# Patient Record
Sex: Female | Born: 1935 | Race: White | Hispanic: No | Marital: Married | Smoking: Former smoker
Health system: Southern US, Community
[De-identification: ages and names within clinical notes are randomized; demographics above are authoritative.]

## PROBLEM LIST (undated history)

## (undated) DIAGNOSIS — M545 Low back pain, unspecified: Secondary | ICD-10-CM

## (undated) DIAGNOSIS — N184 Chronic kidney disease, stage 4 (severe): Secondary | ICD-10-CM

## (undated) DIAGNOSIS — I2510 Atherosclerotic heart disease of native coronary artery without angina pectoris: Secondary | ICD-10-CM

## (undated) DIAGNOSIS — K859 Acute pancreatitis without necrosis or infection, unspecified: Secondary | ICD-10-CM

## (undated) DIAGNOSIS — I480 Paroxysmal atrial fibrillation: Secondary | ICD-10-CM

## (undated) DIAGNOSIS — M797 Fibromyalgia: Secondary | ICD-10-CM

## (undated) DIAGNOSIS — I639 Cerebral infarction, unspecified: Secondary | ICD-10-CM

## (undated) DIAGNOSIS — M1990 Unspecified osteoarthritis, unspecified site: Secondary | ICD-10-CM

## (undated) DIAGNOSIS — E785 Hyperlipidemia, unspecified: Secondary | ICD-10-CM

## (undated) DIAGNOSIS — G8929 Other chronic pain: Secondary | ICD-10-CM

## (undated) DIAGNOSIS — I10 Essential (primary) hypertension: Secondary | ICD-10-CM

## (undated) DIAGNOSIS — Z950 Presence of cardiac pacemaker: Secondary | ICD-10-CM

## (undated) DIAGNOSIS — E119 Type 2 diabetes mellitus without complications: Secondary | ICD-10-CM

## (undated) DIAGNOSIS — G4730 Sleep apnea, unspecified: Secondary | ICD-10-CM

## (undated) HISTORY — PX: FIXATION KYPHOPLASTY LUMBAR SPINE: SHX1642

## (undated) HISTORY — PX: CARDIAC VALVE REPLACEMENT: SHX585

## (undated) HISTORY — PX: KNEE ARTHROSCOPY: SHX127

## (undated) HISTORY — PX: AORTIC VALVE REPLACEMENT (AVR)/CORONARY ARTERY BYPASS GRAFTING (CABG): SHX5725

## (undated) HISTORY — PX: BACK SURGERY: SHX140

## (undated) HISTORY — PX: BREAST SURGERY: SHX581

---

## 1962-07-27 HISTORY — PX: PARTIAL THYMECTOMY: SHX2177

## 2004-07-27 HISTORY — PX: ADRENALECTOMY: SHX876

## 2008-07-27 DIAGNOSIS — I639 Cerebral infarction, unspecified: Secondary | ICD-10-CM

## 2008-07-27 HISTORY — DX: Cerebral infarction, unspecified: I63.9

## 2012-07-27 HISTORY — PX: CARDIAC CATHETERIZATION: SHX172

## 2012-07-27 HISTORY — PX: CORONARY ARTERY BYPASS GRAFT: SHX141

## 2013-04-05 HISTORY — PX: INSERT / REPLACE / REMOVE PACEMAKER: SUR710

## 2015-05-31 ENCOUNTER — Other Ambulatory Visit (HOSPITAL_COMMUNITY): Payer: Self-pay | Admitting: Neurology

## 2015-05-31 DIAGNOSIS — R413 Other amnesia: Secondary | ICD-10-CM

## 2015-05-31 DIAGNOSIS — M549 Dorsalgia, unspecified: Secondary | ICD-10-CM

## 2015-05-31 DIAGNOSIS — G8929 Other chronic pain: Secondary | ICD-10-CM

## 2015-06-13 ENCOUNTER — Ambulatory Visit (HOSPITAL_COMMUNITY)
Admission: RE | Admit: 2015-06-13 | Discharge: 2015-06-13 | Disposition: A | Discharge status: Home / Self Care | Payer: Medicare Other | Source: Ambulatory Visit | Attending: Neurology | Admitting: Neurology

## 2015-06-13 ENCOUNTER — Encounter (HOSPITAL_COMMUNITY): Payer: Self-pay | Admitting: *Deleted

## 2015-06-13 DIAGNOSIS — G319 Degenerative disease of nervous system, unspecified: Secondary | ICD-10-CM | POA: Diagnosis not present

## 2015-06-13 DIAGNOSIS — M549 Dorsalgia, unspecified: Secondary | ICD-10-CM | POA: Insufficient documentation

## 2015-06-13 DIAGNOSIS — R413 Other amnesia: Secondary | ICD-10-CM | POA: Diagnosis not present

## 2015-06-13 DIAGNOSIS — G8929 Other chronic pain: Secondary | ICD-10-CM

## 2015-06-13 DIAGNOSIS — M5136 Other intervertebral disc degeneration, lumbar region: Secondary | ICD-10-CM | POA: Insufficient documentation

## 2015-06-13 NOTE — Progress Notes (Signed)
Monitor pt in MRI via pulse ox after PPM placed in AV mode per [employer] rep.

## 2017-02-14 ENCOUNTER — Emergency Department (HOSPITAL_COMMUNITY): Payer: Medicare Other

## 2017-02-14 ENCOUNTER — Encounter (HOSPITAL_COMMUNITY): Payer: Self-pay | Admitting: Emergency Medicine

## 2017-02-14 ENCOUNTER — Inpatient Hospital Stay (HOSPITAL_COMMUNITY)
Admission: EM | Admit: 2017-02-14 | Discharge: 2017-02-23 | DRG: 418 | Disposition: A | Discharge status: Home / Self Care | Payer: Medicare Other | Attending: Internal Medicine | Admitting: Internal Medicine

## 2017-02-14 DIAGNOSIS — K8050 Calculus of bile duct without cholangitis or cholecystitis without obstruction: Secondary | ICD-10-CM | POA: Diagnosis not present

## 2017-02-14 DIAGNOSIS — M1991 Primary osteoarthritis, unspecified site: Secondary | ICD-10-CM | POA: Diagnosis present

## 2017-02-14 DIAGNOSIS — I2510 Atherosclerotic heart disease of native coronary artery without angina pectoris: Secondary | ICD-10-CM | POA: Diagnosis present

## 2017-02-14 DIAGNOSIS — I480 Paroxysmal atrial fibrillation: Secondary | ICD-10-CM | POA: Diagnosis not present

## 2017-02-14 DIAGNOSIS — N179 Acute kidney failure, unspecified: Secondary | ICD-10-CM | POA: Diagnosis present

## 2017-02-14 DIAGNOSIS — E669 Obesity, unspecified: Secondary | ICD-10-CM | POA: Diagnosis present

## 2017-02-14 DIAGNOSIS — R109 Unspecified abdominal pain: Secondary | ICD-10-CM

## 2017-02-14 DIAGNOSIS — Z79899 Other long term (current) drug therapy: Secondary | ICD-10-CM | POA: Diagnosis not present

## 2017-02-14 DIAGNOSIS — K859 Acute pancreatitis without necrosis or infection, unspecified: Secondary | ICD-10-CM

## 2017-02-14 DIAGNOSIS — Z01810 Encounter for preprocedural cardiovascular examination: Secondary | ICD-10-CM | POA: Diagnosis not present

## 2017-02-14 DIAGNOSIS — K5900 Constipation, unspecified: Secondary | ICD-10-CM | POA: Diagnosis present

## 2017-02-14 DIAGNOSIS — N184 Chronic kidney disease, stage 4 (severe): Secondary | ICD-10-CM | POA: Diagnosis present

## 2017-02-14 DIAGNOSIS — I10 Essential (primary) hypertension: Secondary | ICD-10-CM | POA: Diagnosis not present

## 2017-02-14 DIAGNOSIS — I129 Hypertensive chronic kidney disease with stage 1 through stage 4 chronic kidney disease, or unspecified chronic kidney disease: Secondary | ICD-10-CM | POA: Diagnosis present

## 2017-02-14 DIAGNOSIS — Z8673 Personal history of transient ischemic attack (TIA), and cerebral infarction without residual deficits: Secondary | ICD-10-CM

## 2017-02-14 DIAGNOSIS — Z885 Allergy status to narcotic agent status: Secondary | ICD-10-CM | POA: Diagnosis not present

## 2017-02-14 DIAGNOSIS — E119 Type 2 diabetes mellitus without complications: Secondary | ICD-10-CM | POA: Diagnosis not present

## 2017-02-14 DIAGNOSIS — Z794 Long term (current) use of insulin: Secondary | ICD-10-CM

## 2017-02-14 DIAGNOSIS — E890 Postprocedural hypothyroidism: Secondary | ICD-10-CM | POA: Diagnosis present

## 2017-02-14 DIAGNOSIS — Z951 Presence of aortocoronary bypass graft: Secondary | ICD-10-CM | POA: Diagnosis not present

## 2017-02-14 DIAGNOSIS — D649 Anemia, unspecified: Secondary | ICD-10-CM | POA: Diagnosis present

## 2017-02-14 DIAGNOSIS — I482 Chronic atrial fibrillation: Secondary | ICD-10-CM | POA: Diagnosis present

## 2017-02-14 DIAGNOSIS — M797 Fibromyalgia: Secondary | ICD-10-CM | POA: Diagnosis present

## 2017-02-14 DIAGNOSIS — Z7983 Long term (current) use of bisphosphonates: Secondary | ICD-10-CM | POA: Diagnosis not present

## 2017-02-14 DIAGNOSIS — Z950 Presence of cardiac pacemaker: Secondary | ICD-10-CM | POA: Diagnosis not present

## 2017-02-14 DIAGNOSIS — Z880 Allergy status to penicillin: Secondary | ICD-10-CM

## 2017-02-14 DIAGNOSIS — Z952 Presence of prosthetic heart valve: Secondary | ICD-10-CM

## 2017-02-14 DIAGNOSIS — Z7901 Long term (current) use of anticoagulants: Secondary | ICD-10-CM

## 2017-02-14 DIAGNOSIS — I4581 Long QT syndrome: Secondary | ICD-10-CM | POA: Diagnosis present

## 2017-02-14 DIAGNOSIS — R269 Unspecified abnormalities of gait and mobility: Secondary | ICD-10-CM

## 2017-02-14 DIAGNOSIS — Z881 Allergy status to other antibiotic agents status: Secondary | ICD-10-CM | POA: Diagnosis not present

## 2017-02-14 DIAGNOSIS — K851 Biliary acute pancreatitis without necrosis or infection: Secondary | ICD-10-CM | POA: Diagnosis not present

## 2017-02-14 DIAGNOSIS — E1122 Type 2 diabetes mellitus with diabetic chronic kidney disease: Secondary | ICD-10-CM | POA: Diagnosis present

## 2017-02-14 DIAGNOSIS — Z6835 Body mass index (BMI) 35.0-35.9, adult: Secondary | ICD-10-CM

## 2017-02-14 DIAGNOSIS — Z419 Encounter for procedure for purposes other than remedying health state, unspecified: Secondary | ICD-10-CM

## 2017-02-14 DIAGNOSIS — G8929 Other chronic pain: Secondary | ICD-10-CM | POA: Diagnosis present

## 2017-02-14 DIAGNOSIS — E785 Hyperlipidemia, unspecified: Secondary | ICD-10-CM | POA: Diagnosis present

## 2017-02-14 DIAGNOSIS — Z888 Allergy status to other drugs, medicaments and biological substances status: Secondary | ICD-10-CM

## 2017-02-14 DIAGNOSIS — I340 Nonrheumatic mitral (valve) insufficiency: Secondary | ICD-10-CM | POA: Diagnosis not present

## 2017-02-14 DIAGNOSIS — I4891 Unspecified atrial fibrillation: Secondary | ICD-10-CM | POA: Diagnosis present

## 2017-02-14 HISTORY — DX: Fibromyalgia: M79.7

## 2017-02-14 HISTORY — DX: Paroxysmal atrial fibrillation: I48.0

## 2017-02-14 HISTORY — DX: Low back pain: M54.5

## 2017-02-14 HISTORY — DX: Acute pancreatitis without necrosis or infection, unspecified: K85.90

## 2017-02-14 HISTORY — DX: Low back pain, unspecified: M54.50

## 2017-02-14 HISTORY — DX: Unspecified osteoarthritis, unspecified site: M19.90

## 2017-02-14 HISTORY — DX: Other chronic pain: G89.29

## 2017-02-14 HISTORY — DX: Chronic kidney disease, stage 4 (severe): N18.4

## 2017-02-14 HISTORY — DX: Type 2 diabetes mellitus without complications: E11.9

## 2017-02-14 HISTORY — DX: Atherosclerotic heart disease of native coronary artery without angina pectoris: I25.10

## 2017-02-14 HISTORY — DX: Sleep apnea, unspecified: G47.30

## 2017-02-14 HISTORY — DX: Cerebral infarction, unspecified: I63.9

## 2017-02-14 HISTORY — DX: Hyperlipidemia, unspecified: E78.5

## 2017-02-14 HISTORY — DX: Presence of cardiac pacemaker: Z95.0

## 2017-02-14 HISTORY — DX: Essential (primary) hypertension: I10

## 2017-02-14 LAB — CBC
HCT: 39.4 % (ref 36.0–46.0)
Hemoglobin: 13 g/dL (ref 12.0–15.0)
MCH: 31.3 pg (ref 26.0–34.0)
MCHC: 33 g/dL (ref 30.0–36.0)
MCV: 94.7 fL (ref 78.0–100.0)
PLATELETS: 261 10*3/uL (ref 150–400)
RBC: 4.16 MIL/uL (ref 3.87–5.11)
RDW: 12.9 % (ref 11.5–15.5)
WBC: 18.5 10*3/uL — ABNORMAL HIGH (ref 4.0–10.5)

## 2017-02-14 LAB — COMPREHENSIVE METABOLIC PANEL
ALBUMIN: 3.7 g/dL (ref 3.5–5.0)
ALK PHOS: 154 U/L — AB (ref 38–126)
ALT: 453 U/L — AB (ref 14–54)
AST: 508 U/L — ABNORMAL HIGH (ref 15–41)
Anion gap: 13 (ref 5–15)
BUN: 35 mg/dL — ABNORMAL HIGH (ref 6–20)
CALCIUM: 9.5 mg/dL (ref 8.9–10.3)
CO2: 20 mmol/L — AB (ref 22–32)
CREATININE: 2.26 mg/dL — AB (ref 0.44–1.00)
Chloride: 104 mmol/L (ref 101–111)
GFR calc non Af Amer: 19 mL/min — ABNORMAL LOW (ref >60)
GFR, EST AFRICAN AMERICAN: 22 mL/min — AB (ref >60)
GLUCOSE: 271 mg/dL — AB (ref 65–99)
Potassium: 4.4 mmol/L (ref 3.5–5.1)
SODIUM: 137 mmol/L (ref 135–145)
Total Bilirubin: 1.7 mg/dL — ABNORMAL HIGH (ref 0.3–1.2)
Total Protein: 6.7 g/dL (ref 6.5–8.1)

## 2017-02-14 LAB — I-STAT TROPONIN, ED: TROPONIN I, POC: 0.02 ng/mL (ref 0.00–0.08)

## 2017-02-14 LAB — CREATININE, SERUM
CREATININE: 2.21 mg/dL — AB (ref 0.44–1.00)
GFR calc Af Amer: 23 mL/min — ABNORMAL LOW (ref >60)
GFR, EST NON AFRICAN AMERICAN: 20 mL/min — AB (ref >60)

## 2017-02-14 LAB — LIPASE, BLOOD: Lipase: 798 U/L — ABNORMAL HIGH (ref 11–51)

## 2017-02-14 MED ORDER — METRONIDAZOLE IN NACL 5-0.79 MG/ML-% IV SOLN: 500.0000 mg | Freq: Once | INTRAVENOUS | Status: DC

## 2017-02-14 MED ORDER — HEPARIN SODIUM (PORCINE) 5000 UNIT/ML IJ SOLN
5000.0000 [IU] | Freq: Three times a day (TID) | INTRAMUSCULAR | Status: DC
  Administered 2017-02-14 – 2017-02-15 (×3): 5000 [IU] via SUBCUTANEOUS
  Filled 2017-02-14 (×3): qty 1

## 2017-02-14 MED ORDER — CIPROFLOXACIN IN D5W 400 MG/200ML IV SOLN: 400.0000 mg | Freq: Once | INTRAVENOUS | Status: DC

## 2017-02-14 MED ORDER — MORPHINE SULFATE (PF) 4 MG/ML IV SOLN
4.0000 mg | Freq: Once | INTRAVENOUS | Status: AC
  Administered 2017-02-14: 4 mg via INTRAVENOUS
  Filled 2017-02-14: qty 1

## 2017-02-14 MED ORDER — LACTATED RINGERS IV BOLUS (SEPSIS)
500.0000 mL | Freq: Once | INTRAVENOUS | Status: AC
  Administered 2017-02-14: 500 mL via INTRAVENOUS

## 2017-02-14 MED ORDER — LACTATED RINGERS IV BOLUS (SEPSIS)
1000.0000 mL | Freq: Once | INTRAVENOUS | Status: AC
  Administered 2017-02-14: 1000 mL via INTRAVENOUS

## 2017-02-14 MED ORDER — SODIUM CHLORIDE 0.9 % IV SOLN
INTRAVENOUS | Status: AC
  Administered 2017-02-14 – 2017-02-15 (×3): via INTRAVENOUS

## 2017-02-14 MED ORDER — MORPHINE SULFATE (PF) 4 MG/ML IV SOLN
4.0000 mg | INTRAVENOUS | Status: DC | PRN
Start: 2017-02-14 — End: 2017-02-14

## 2017-02-14 MED ORDER — HYDROMORPHONE HCL 1 MG/ML IJ SOLN
0.5000 mg | INTRAMUSCULAR | Status: DC | PRN
  Administered 2017-02-14 – 2017-02-15 (×3): 0.5 mg via INTRAVENOUS
  Filled 2017-02-14 (×3): qty 1

## 2017-02-14 MED ORDER — ONDANSETRON HCL 4 MG/2ML IJ SOLN
4.0000 mg | Freq: Once | INTRAMUSCULAR | Status: AC
  Administered 2017-02-14: 4 mg via INTRAVENOUS
  Filled 2017-02-14: qty 2

## 2017-02-14 MED ORDER — ONDANSETRON 4 MG PO TBDP
4.0000 mg | ORAL_TABLET | Freq: Four times a day (QID) | ORAL | Status: DC | PRN
  Administered 2017-02-15 – 2017-02-17 (×3): 4 mg via ORAL
  Filled 2017-02-14 (×3): qty 1

## 2017-02-14 NOTE — ED Triage Notes (Signed)
Pt has been vomiting since last pm. Pts dtr reports it has been dark green. Pt unable to keep anything down. Pt has not taken her insulin today. CBG 227 per dtr. Pt having periodic severe upper abd pain. Denies CP

## 2017-02-14 NOTE — H&P (Signed)
Date: 02/14/2017               Patient Name:  Daisy Pearson MRN: [id number]  DOB: April 06, 1934 Age / Sex: 83 y.o., female   PCP: Karleen Hampshire., MD         Medical Service: Internal Medicine Teaching Service         Attending Physician: Dr. Aldine Contes, MD    First Contact: Dr. Tarri Abernethy  Pager: [phone]  Second Contact: Dr. Marlowe Sax  Pager: [phone]       After Hours (After 5p/  First Contact Pager: [phone]  weekends / holidays): Second Contact Pager: [phone]   Chief Complaint: Abdominal pain, nausea, and vomiting   History of Present Illness: Daisy Pearson is a 83 y.o. female with history paroxysmal atrial fibrillation on coumadin, insulin-dependent Z2YQ complicated by CKD4, CAD s/p CABG and pacemaker, HTN, HLD, and OA who presents with acute onset of epigastric abdominal pain associated with nausea and vomiting. Patient has been in her usual state of health until yesterday when she started to experience abdominal pain (8/10) last night and immediately started vomiting. She has been vomiting every 20 minutes since them. Emesis is NBNB. Denies changes in bowel movements. Last BM yesterday, no hematochezia or melena. Denies similar episodes in the past. Denied fever, chills, chest pain , SOB, and urinary symptoms.                                                                                                                                                                                              ED course: Patient was HDS on arrival. Lab work remarkable for lipase 798, transaminitis AST 508 and AL 453, and leukocytosis 18.5. RUQ U/S with no acute findings. She received 1.5L LR bolus and IV morphine 4mg  x2 with improvement in pain.  Meds:  Current Meds  Medication Sig  . acetaminophen (TYLENOL) 325 MG tablet Take 650 mg by mouth every 6  (six) hours as needed for headache (pain).  Marland Kitchen alendronate (FOSAMAX) 70 MG tablet Take 70 mg by mouth every Saturday. Take with a full glass of water on an empty stomach.  Marland Kitchen amiodarone (PACERONE) 200 MG tablet Take 200 mg by mouth daily.  Marland Kitchen atorvastatin (LIPITOR) 40 MG tablet Take 40 mg by mouth daily.  . calcitRIOL (ROCALTROL) 0.5 MCG capsule Take 0.5 mcg by mouth daily.  . cetirizine (ZYRTEC) 10 MG tablet Take 10 mg by mouth daily as needed for allergies.  . Cholecalciferol (VITAMIN D) 2000 units tablet Take 2,000 Units by mouth daily.  . enalapril (VASOTEC) 2.5 MG tablet Take 2.5 mg by mouth daily.  Marland Kitchen EPINEPHrine (EPIPEN 2-PAK) 0.3 mg/0.3 mL IJ SOAJ injection Inject 0.3 mg into the muscle once as needed (severe allergic reaction).  . Insulin Detemir (LEVEMIR FLEXTOUCH) 100 UNIT/ML Pen Inject 18 Units into the skin 2 (two) times daily.  . Insulin Glulisine (APIDRA SOLOSTAR) 100 UNIT/ML Solostar Pen Inject 12 Units into the skin See admin instructions. Inject 12 units subcutaneously three times daily before meals - plus adjustment if CBG >200 - 1 unit for every 20  . metaxalone (SKELAXIN) 800 MG tablet Take 800 mg by mouth 3 (three) times daily as needed for muscle spasms.  . Methylfol-Methylcob-Acetylcyst (CEREFOLIN NAC) 6-2-600 MG TABS Take 1 tablet by mouth daily.  . metoprolol tartrate (LOPRESSOR) 25 MG tablet Take 25 mg by mouth 2 (two) times daily.  . mirtazapine (REMERON) 15 MG tablet Take 15 mg by mouth at bedtime.  . nitroGLYCERIN (NITROSTAT) 0.4 MG SL tablet Place 0.4 mg under the tongue every 5 (five) minutes as needed for chest pain.  . Omega-3 Fatty Acids (OMEGA 3 PO) Take 1 capsule by mouth daily.  Marland Kitchen oxyCODONE-acetaminophen (PERCOCET/ROXICET) 5-325 MG tablet Take 1 tablet by mouth 2 (two) times daily as needed for severe pain.  Marland Kitchen QUEtiapine (SEROQUEL) 25 MG tablet Take 25 mg by mouth at bedtime.  Marland Kitchen warfarin (JANTOVEN) 4 MG tablet Take 2-4 mg by mouth See admin instructions. Take 1/2  tablet (2 mg) by mouth on Saturday at bedtime, take 1 tablet (4 mg) on all other nights     Allergies: Allergies as of 02/14/2017 - Review Complete 02/14/2017  Allergen Reaction Noted  . Esomeprazole sodium Swelling 01/18/2015  . Penicillins Anaphylaxis and Rash 02/14/2017  . Clindamycin/lincomycin Hives 02/14/2017  . Hydrocodone Other (See Comments) 02/14/2017  . Nsaids Hives and Other (See Comments) 02/14/2017  . Valsartan Swelling 02/14/2017  . Prilosec [omeprazole] Swelling and Rash 02/14/2017   Past Medical History:  Diagnosis Date  . Arthritis   . Diabetes mellitus without complication ([place])   . Fibromyalgia   . Hypertension   . Renal disorder    Past Surgical History:  Procedure Laterality Date  . ADRENALECTOMY  2006  . AORTIC VALVE REPLACEMENT (AVR)/CORONARY ARTERY BYPASS GRAFTING (CABG)    . PACEMAKER PLACEMENT  04/05/2013   [facility] Serial# [id number] Lemmie Evens [id number], SERIAL [id number]  . THYROIDECTOMY  1964     Family History:  History reviewed. No pertinent family history.  Social History:  Social History  Substance Use Topics  . Smoking status: Never Smoker  . Smokeless tobacco: Never Used  . Alcohol use No    Review of Systems: A complete ROS was negative except as per HPI.   Physical Exam: Blood pressure (!) 109/41, pulse 60,  temperature 99.1 F (37.3 C), temperature source Oral, resp. rate 19, height 5\' 4"  (1.626 m), weight 204 lb (92.5 kg), SpO2 96 %.  General: very pleasant, obese female, lying in bed in no acute distress  HENT: NCAT, neck supple and FROM, MMM, OP clear without exudates or erythema, poor dentition  Eyes: anicteric sclera, PERRL Cardiac: regular rate and rhythm, nl S1/S2, no murmurs, rubs or gallops  Pulm: CTAB, no wheezes or crackles, no increased work of breathing  Abd: soft, obese abdomen, tenderness over epigastrium and R upper and lower quadrants, normoactive bowel sounds, negative Murphy sign  Neuro: A&Ox3, able to  move all four extremities, no focal deficits  Ext: warm and well perfused, mild peripheral edema bilaterally, 2+ DP pulses bilaterally, nl skin turgor  Derm: no skin findings  Psych: forgetful at times, appropriate affect   Labs:  CBC WBC 18.5 H/H 13.0/39.4 Plt 261 CMP 137  4.4  104  20  35  2.26  271 Lipase 798 I-stat troponin negtative   EKG: personally reviewed my interpretation is atrial-paced rhythm at 60bpm, prolonged QT 576ms, no axis deviation, no acute ischemic changes   CXR: personally reviewed my interpretation is patent airway, no enlargement of cardiac silhouette, sternotomy from CABG and pacemaker noted, no consolidations, opacities, effusion or edema noted   RUQ U/S: Gallbladder:No gallstones or gallbladder wall thickening. No pericholecystic fluid. The sonographer reports no sonographic Murphy's sign. Common bile duct: Diameter: 4 mm Liver: No focal lesion identified. Within normal limits in parenchymal echogenicity.   Assessment & Plan by Problem:  Shequita J. Zenz is a 83 y.o. female with history paroxysmal atrial fibrillation on coumadin, insulin-dependent M0QQ complicated by CKD4, CAD s/p CABG and pacemaker, HTN, HLD, and OA who presents with acute onset of epigastric abdominal pain associated with nausea and vomiting.  # Acute pancreatitis: Patient presented with acute onset epigastric/RUQ abdominal pain associated with nausea and vomiting, lipase 798 and leukocytosis 18.5 consistent with acute pancreatitis. RUQ [place] with no evidence of gallstones, obstruction, or cholecystitis. No history of alcohol use and no recent changes in medications.  - NPO  - NS @ 150cc/hr  - Pain control: IV Dilaudid 0.5mg  q4h PRN  - CMP and lipid panel in AM  - GI consulted and will see in AM  - IV zofran 4mg  PRN for nausea   # Atrial fibrillation, paroxysmal: On amiodarone and warfarin at home  - Holding home meds in the setting of NPO  - SQ heparin   # T2DM,  insulin-dependent: Takes levemir 30U qAM and qHS + sliding scale with meals. Last A1c on chart 7.7 03/2015.  - Will hold home meds since patient is NPO - A1c pending  - CBG monitoring   # CKD 4: Cr 2.26. Baseline unknown.  - CMP in AM  - Avoid nephrotoxic drugs   # HTN: On enalapril 2.5mg  daily at home  - Holding home meds in the setting of NPO   # HLD  - Holding home atorvastatin in the setting of NPO   # CAD s/p CABG: On metoprolol tartrate 25mg  BID at home.  - Holding home meds in the setting of NPO   # Chronic pain: On percocet 5 at home  - Holding home atorvastatin in the setting of NPO    IVF: NS @150cc /hr Diet: NPO DVT ppx: SQ heparin  Code status: Full code, confirmed on admission   Dispo: Admit patient to Inpatient with expected length of stay greater than 2 midnights.  Signed: Welford Roche, MD  Internal Medicine PGY-1  P [phone] 02/14/2017, 10:36 PM

## 2017-02-14 NOTE — ED Provider Notes (Addendum)
[facility] DEPT Provider Note   CSN: [id number] Arrival date & time: 02/14/17  1619     History   Chief Complaint Chief Complaint  Patient presents with  . Emesis    HPI Daisy Pearson is a 83 y.o. female.  HPI Pt comes in with cc of abd pain. Pt has hx of DM, HTN. Pt reports that she started having epigastric pain radiating to the sides last night and she has had 20+ episodes of emesis since then. Pt's emesis is dark in color. Pt has no radiation of the pain. Pt has some dizziness, lightheadedness. She had a BM today.   Past Medical History:  Diagnosis Date  . Arthritis   . Diabetes mellitus without complication ([place])   . Fibromyalgia   . Hypertension   . Renal disorder     There are no active problems to display for this patient.   Past Surgical History:  Procedure Laterality Date  . ADRENALECTOMY  2006  . AORTIC VALVE REPLACEMENT (AVR)/CORONARY ARTERY BYPASS GRAFTING (CABG)    . PACEMAKER PLACEMENT  04/05/2013   [facility] Serial# [id number] Lemmie Evens [id number], SERIAL [id number]  . THYROIDECTOMY  1964    OB History    No data available       Home Medications    Prior to Admission medications   Medication Sig Start Date End Date Taking? Authorizing Provider  acetaminophen (TYLENOL) 325 MG tablet Take 650 mg by mouth every 6 (six) hours as needed for headache (pain).   Yes [provider]  alendronate (FOSAMAX) 70 MG tablet Take 70 mg by mouth every Saturday. Take with a full glass of water on an empty stomach.   Yes [provider]  amiodarone (PACERONE) 200 MG tablet Take 200 mg by mouth daily.   Yes [provider]  atorvastatin (LIPITOR) 40 MG tablet Take 40 mg by mouth daily.   Yes [provider]  calcitRIOL (ROCALTROL) 0.5 MCG capsule Take 0.5 mcg by mouth daily.   Yes [provider]  cetirizine (ZYRTEC) 10 MG tablet Take 10 mg by mouth daily as needed for allergies.   Yes [provider]    Cholecalciferol (VITAMIN D) 2000 units tablet Take 2,000 Units by mouth daily.   Yes [provider]  enalapril (VASOTEC) 2.5 MG tablet Take 2.5 mg by mouth daily.   Yes [provider]  EPINEPHrine (EPIPEN 2-PAK) 0.3 mg/0.3 mL IJ SOAJ injection Inject 0.3 mg into the muscle once as needed (severe allergic reaction).   Yes [provider]  Insulin Detemir (LEVEMIR FLEXTOUCH) 100 UNIT/ML Pen Inject 18 Units into the skin 2 (two) times daily.   Yes [provider]  Insulin Glulisine (APIDRA SOLOSTAR) 100 UNIT/ML Solostar Pen Inject 12 Units into the skin See admin instructions. Inject 12 units subcutaneously three times daily before meals - plus adjustment if CBG >200 - 1 unit for every 20   Yes [provider]  metaxalone (SKELAXIN) 800 MG tablet Take 800 mg by mouth 3 (three) times daily as needed for muscle spasms.   Yes [provider]  Methylfol-Methylcob-Acetylcyst (CEREFOLIN NAC) 6-2-600 MG TABS Take 1 tablet by mouth daily.   Yes [provider]  metoprolol tartrate (LOPRESSOR) 25 MG tablet Take 25 mg by mouth 2 (two) times daily.   Yes [provider]  mirtazapine (REMERON) 15 MG tablet Take 15 mg by mouth at bedtime.   Yes [provider]  nitroGLYCERIN (NITROSTAT) 0.4 MG SL tablet Place 0.4 mg  under the tongue every 5 (five) minutes as needed for chest pain.   Yes [provider]  Omega-3 Fatty Acids (OMEGA 3 PO) Take 1 capsule by mouth daily.   Yes [provider]  oxyCODONE-acetaminophen (PERCOCET/ROXICET) 5-325 MG tablet Take 1 tablet by mouth 2 (two) times daily as needed for severe pain.   Yes [provider]  QUEtiapine (SEROQUEL) 25 MG tablet Take 25 mg by mouth at bedtime.   Yes [provider]  warfarin (JANTOVEN) 4 MG tablet Take 2-4 mg by mouth See admin instructions. Take 1/2 tablet (2 mg) by mouth on Saturday at bedtime, take 1 tablet (4 mg) on all other nights    Yes [provider]    Family History History reviewed. No pertinent family history.  Social History Social History  Substance Use Topics  . Smoking status: Never Smoker  . Smokeless tobacco: Never Used  . Alcohol use No     Allergies   Esomeprazole sodium; Penicillins; Clindamycin/lincomycin; Hydrocodone; Nsaids; Valsartan; and Prilosec [omeprazole]   Review of Systems Review of Systems  Constitutional: Positive for activity change.  Gastrointestinal: Positive for abdominal pain, nausea and vomiting.  All other systems reviewed and are negative.    Physical Exam Updated Vital Signs BP (!) 123/45   Pulse 60   Temp 98.4 F (36.9 C) (Oral)   Resp 20   Ht 5\' 4"  (1.626 m)   Wt 92.5 kg (204 lb)   SpO2 96%   BMI 35.02 kg/m   Physical Exam  Constitutional: She is oriented to person, place, and time. She appears well-developed and well-nourished.  HENT:  Head: Normocephalic and atraumatic.  Eyes: Pupils are equal, round, and reactive to light. EOM are normal.  Neck: Neck supple.  Cardiovascular: Normal rate, regular rhythm and normal heart sounds.   No murmur heard. Pulmonary/Chest: Effort normal. No respiratory distress.  Abdominal: Soft. She exhibits no distension. There is tenderness. There is guarding. There is no rebound.  Epigastric and RUQ tenderness with guarding  Neurological: She is alert and oriented to person, place, and time.  Skin: Skin is warm and dry.  Nursing note and vitals reviewed.    ED Treatments / Results  Labs (all labs ordered are listed, but only abnormal results are displayed) Labs Reviewed  LIPASE, BLOOD - Abnormal; Notable for the following:       Result Value   Lipase 798 (*)    All other components within normal limits  COMPREHENSIVE METABOLIC PANEL - Abnormal; Notable for the following:    CO2 20 (*)    Glucose, Bld 271 (*)    BUN 35 (*)    Creatinine, Ser 2.26 (*)    AST 508 (*)    ALT 453 (*)    Alkaline  Phosphatase 154 (*)    Total Bilirubin 1.7 (*)    GFR calc non Af Amer 19 (*)    GFR calc Af Amer 22 (*)    All other components within normal limits  CBC - Abnormal; Notable for the following:    WBC 18.5 (*)    All other components within normal limits  URINALYSIS, ROUTINE W REFLEX MICROSCOPIC  I-STAT TROPONIN, ED    EKG  EKG Interpretation  Date/Time:  Sunday February 14 2017 16:36:58 EDT Ventricular Rate:  60 PR Interval:  216 QRS Duration: 90 QT Interval:  514 QTC Calculation: 514 R Axis:   44 Text Interpretation:  Atrial-paced rhythm with prolonged AV conduction Possible Inferior infarct ,  age undetermined Prolonged QT Abnormal ECG No acute changes No old tracing to compare s1q3 w.o t3 Confirmed by Varney Biles [phone]) on 02/14/2017 5:01:07 PM       Radiology Dg Chest 2 View  Result Date: 02/14/2017 CLINICAL DATA:  Mid chest pain and upper abdominal pain beginning last night. Vomiting and shortness of breath. EXAM: CHEST  2 VIEW COMPARISON:  05/03/2015 FINDINGS: Pacemaker appears unchanged. Previous median sternotomy and CABG. Heart size is stable with left ventricular prominence. Aortic atherosclerosis. The pulmonary vascularity is normal. No infiltrate, collapse or effusion. Old vertebral augmentation. IMPRESSION: No active disease.  Previous CABG.  Pacemaker. Electronically Signed   By: Nelson Chimes M.D.   On: 02/14/2017 17:08   US Abdomen Limited Ruq  Result Date: 02/14/2017 CLINICAL DATA:  Abdominal pain EXAM: ULTRASOUND ABDOMEN LIMITED RIGHT UPPER QUADRANT COMPARISON:  No comparison studies available. FINDINGS: Gallbladder: No gallstones or gallbladder wall thickening. No pericholecystic fluid. The sonographer reports no sonographic Murphy's sign. Common bile duct: Diameter: 4 mm Liver: No focal lesion identified. Within normal limits in parenchymal echogenicity. IMPRESSION: No acute findings.  No evidence for cholelithiasis. Electronically Signed   By: Misty Stanley M.D.    On: 02/14/2017 18:55    Procedures Procedures (including critical care time)  Medications Ordered in ED Medications  morphine 4 MG/ML injection 4 mg (not administered)  morphine 4 MG/ML injection 4 mg (4 mg Intravenous Given 02/14/17 1727)  ondansetron (ZOFRAN) injection 4 mg (4 mg Intravenous Given 02/14/17 1726)  lactated ringers bolus 1,000 mL (0 mLs Intravenous Stopped 02/14/17 1924)  lactated ringers bolus 500 mL (500 mLs Intravenous New Bag/Given 02/14/17 1925)  morphine 4 MG/ML injection 4 mg (4 mg Intravenous Given 02/14/17 1929)     Initial Impression / Assessment and Plan / ED Course  I have reviewed the triage vital signs and the nursing notes.  Pertinent labs & imaging results that were available during my care of the patient were reviewed by me and considered in my medical decision making (see chart for details).  Clinical Course as of Feb 14 2026  Nancy Fetter Feb 14, 2017  1923 Spoke with GI. They recommend cancelling antibiotics if there is no gall bladder edema. They will see pt tomorrow. Results from the ER workup discussed with the patient face to face and all questions answered to the best of my ability.  US Abdomen Limited RUQ [AN]    Clinical Course User Index [AN] Varney Biles, MD    DDx includes: Pancreatitis Hepatobiliary pathology including cholecystitis Gastritis/PUD SBO ACS syndrome Dissection  Pt comes in with cc of abd pain. Clinical concerns for hall stones pancreatitis. We will hydrate and get symptoms in better control. [place] ordered.   Final Clinical Impressions(s) / ED Diagnoses   Final diagnoses:  Abdominal pain  Acute biliary pancreatitis, unspecified complication status  Choledocholithiasis    New Prescriptions New Prescriptions   No medications on file     Varney Biles, MD 02/14/17 Rodman Comp, MD 02/14/17 2027

## 2017-02-14 NOTE — ED Notes (Signed)
Patient transported to Ultrasound 

## 2017-02-14 NOTE — ED Notes (Signed)
Patient transported to X-ray 

## 2017-02-15 LAB — CBC
HCT: 33.1 % — ABNORMAL LOW (ref 36.0–46.0)
Hemoglobin: 10.6 g/dL — ABNORMAL LOW (ref 12.0–15.0)
MCH: 30.8 pg (ref 26.0–34.0)
MCHC: 32 g/dL (ref 30.0–36.0)
MCV: 96.2 fL (ref 78.0–100.0)
Platelets: 176 10*3/uL (ref 150–400)
RBC: 3.44 MIL/uL — AB (ref 3.87–5.11)
RDW: 13.2 % (ref 11.5–15.5)
WBC: 11.4 10*3/uL — AB (ref 4.0–10.5)

## 2017-02-15 LAB — COMPREHENSIVE METABOLIC PANEL
ALT: 284 U/L — ABNORMAL HIGH (ref 14–54)
AST: 252 U/L — AB (ref 15–41)
Albumin: 2.9 g/dL — ABNORMAL LOW (ref 3.5–5.0)
Alkaline Phosphatase: 111 U/L (ref 38–126)
Anion gap: 6 (ref 5–15)
BUN: 38 mg/dL — AB (ref 6–20)
CHLORIDE: 107 mmol/L (ref 101–111)
CO2: 26 mmol/L (ref 22–32)
Calcium: 8.6 mg/dL — ABNORMAL LOW (ref 8.9–10.3)
Creatinine, Ser: 2.06 mg/dL — ABNORMAL HIGH (ref 0.44–1.00)
GFR calc Af Amer: 25 mL/min — ABNORMAL LOW (ref >60)
GFR, EST NON AFRICAN AMERICAN: 21 mL/min — AB (ref >60)
Glucose, Bld: 224 mg/dL — ABNORMAL HIGH (ref 65–99)
POTASSIUM: 4.6 mmol/L (ref 3.5–5.1)
SODIUM: 139 mmol/L (ref 135–145)
Total Bilirubin: 0.8 mg/dL (ref 0.3–1.2)
Total Protein: 5.3 g/dL — ABNORMAL LOW (ref 6.5–8.1)

## 2017-02-15 LAB — GLUCOSE, CAPILLARY
GLUCOSE-CAPILLARY: 152 mg/dL — AB (ref 65–99)
Glucose-Capillary: 242 mg/dL — ABNORMAL HIGH (ref 65–99)
Glucose-Capillary: 195 mg/dL — ABNORMAL HIGH (ref 65–99)
Glucose-Capillary: 165 mg/dL — ABNORMAL HIGH (ref 65–99)
Glucose-Capillary: 188 mg/dL — ABNORMAL HIGH (ref 65–99)
Glucose-Capillary: 179 mg/dL — ABNORMAL HIGH (ref 65–99)

## 2017-02-15 LAB — LIPID PANEL
Cholesterol: 111 mg/dL (ref 0–200)
HDL: 48 mg/dL (ref >40)
LDL CALC: 50 mg/dL (ref 0–99)
TRIGLYCERIDES: 63 mg/dL (ref <150)
Total CHOL/HDL Ratio: 2.3 RATIO
VLDL: 13 mg/dL (ref 0–40)

## 2017-02-15 LAB — PROTIME-INR
INR: 2.26
Prothrombin Time: 25.4 seconds — ABNORMAL HIGH (ref 11.4–15.2)

## 2017-02-15 MED ORDER — HYDROMORPHONE HCL 1 MG/ML IJ SOLN
1.0000 mg | INTRAMUSCULAR | Status: DC | PRN
  Administered 2017-02-15 – 2017-02-17 (×8): 1 mg via INTRAVENOUS
  Filled 2017-02-15 (×8): qty 1

## 2017-02-15 MED ORDER — SODIUM CHLORIDE 0.9 % IV SOLN
INTRAVENOUS | Status: AC
Start: 2017-02-15 — End: 2017-02-16
  Administered 2017-02-15: 19:00:00 via INTRAVENOUS

## 2017-02-15 MED ORDER — INSULIN ASPART 100 UNIT/ML ~~LOC~~ SOLN
0.0000 [IU] | Freq: Three times a day (TID) | SUBCUTANEOUS | Status: DC
  Administered 2017-02-15 – 2017-02-16 (×3): 2 [IU] via SUBCUTANEOUS
  Administered 2017-02-16 (×2): 3 [IU] via SUBCUTANEOUS
  Administered 2017-02-17: 2 [IU] via SUBCUTANEOUS
  Administered 2017-02-17: 1 [IU] via SUBCUTANEOUS
  Administered 2017-02-17 – 2017-02-18 (×2): 2 [IU] via SUBCUTANEOUS
  Administered 2017-02-18 (×2): 3 [IU] via SUBCUTANEOUS
  Administered 2017-02-19: 2 [IU] via SUBCUTANEOUS
  Administered 2017-02-19: 3 [IU] via SUBCUTANEOUS
  Administered 2017-02-20: 2 [IU] via SUBCUTANEOUS

## 2017-02-15 NOTE — Evaluation (Signed)
Occupational Therapy Evaluation Patient Details Name: Daisy Pearson MRN: [id number] DOB: [birth year] Today's Date: 02/15/2017    History of Present Illness Daisy Pearson is a 83 y.o. female with past medical history of paroxysmal atrial fibrillation currently on Coumadin, diabetes, chronic kidney disease, history of coronary artery disease, history of pacemaker placement admitted to the hospital with the abdominal pain nausea and vomiting. She was found to have elevated lipase of 798. Normal ultrasound without any cholelithiasis.   W/u for acute pancreatitis.     Clinical Impression   PT admitted with workup in place for abdominal pain and vomiting.. Pt currently with functional limitiations due to the deficits listed below (see OT problem list). PTA was independent with all adls. Pt currently limited by adominal pain 10/10. Pt will benefit from skilled OT to increase their independence and safety with adls and balance to allow discharge home .     Follow Up Recommendations  No OT follow up    Equipment Recommendations  None recommended by OT    Recommendations for Other Services       Precautions / Restrictions Precautions Precautions: None Restrictions Weight Bearing Restrictions: No Other Position/Activity Restrictions:  (Pt able to tolerate long sitting in recliner for ~1 hour. )      Mobility Bed Mobility Overal bed mobility: Needs Assistance Bed Mobility: Supine to Sit     Supine to sit: Modified independent (Device/Increase time)     General bed mobility comments: used rail to come to EOB.  Transfers Overall transfer level: Needs assistance Equipment used: Rolling walker (2 wheeled) Transfers: Sit to/from Stand Sit to Stand: Min guard         General transfer comment: Pt required min cues for hand placement and to decrease velocity during descent. Pt tends to demonstrate impulsivity during stand to sit transfers causing increase pain in abdomen.       Balance Overall balance assessment: Needs assistance Sitting-balance support: Feet supported;No upper extremity supported Sitting balance-Leahy Scale: Fair     Standing balance support: Bilateral upper extremity supported;During functional activity Standing balance-Leahy Scale: Poor Standing balance comment: relies on RW for support but not heavily                           ADL either performed or assessed with clinical judgement   ADL Overall ADL's : Needs assistance/impaired Eating/Feeding: Independent   Grooming: Wash/dry hands;Wash/dry face;Independent;Bed level   Upper Body Bathing: Supervision/ safety   Lower Body Bathing: Supervison/ safety Lower Body Bathing Details (indicate cue type and reason): able to reach feet           Toilet Transfer Details (indicate cue type and reason): declined OOB to attempt           General ADL Comments: pt bed level evaluation due to 10 out 10 abdomen pain     Vision Baseline Vision/History: Wears glasses Wears Glasses: At all times       Perception     Praxis      Pertinent Vitals/Pain Pain Assessment: 0-10 Pain Score: 10-Worst pain ever Pain Location: RUQ of abdomen.  Pain Descriptors / Indicators: Aching;Grimacing;Guarding;Sharp Pain Intervention(s): Monitored during session;Premedicated before session;Repositioned     Hand Dominance     Extremity/Trunk Assessment Upper Extremity Assessment Upper Extremity Assessment: Overall WFL for tasks assessed   Lower Extremity Assessment Lower Extremity Assessment: Defer to PT evaluation   Cervical / Trunk Assessment Cervical / Trunk Assessment: Normal  Communication Communication Communication: No difficulties   Cognition Arousal/Alertness: Awake/alert Behavior During Therapy: WFL for tasks assessed/performed Overall Cognitive Status: Within Functional Limits for tasks assessed                                 General Comments: Pt  willing to ambulate and perform PT and work through pain.    General Comments  pt has ability to reach feet and was dressed from waist down. Pt limited by pain and will progress quickly    Exercises Exercises: General Lower Extremity General Exercises - Lower Extremity Ankle Circles/Pumps: AROM;20 reps;Seated;Both Quad Sets: AROM;Both;10 reps;Seated Short Arc Quad: AROM;Both;5 reps;Seated   Shoulder Instructions      Home Living Family/patient expects to be discharged to:: Private residence Living Arrangements: Children Available Help at Discharge: Family;Available 24 hours/day (lives with daughter who is there most of day) Type of Home: House Home Access: Stairs to enter [employer] of Steps: 4 Entrance Stairs-Rails: None Home Layout: One level     Bathroom Shower/Tub: Teacher, early years/pre: Standard     Home Equipment: Environmental consultant - 2 wheels;Wheelchair - [employer];Tub bench;Hand held shower head (bed rail for left side of bed)   Additional Comments: used wheelchair and RW outdoors per daughter.       Prior Functioning/Environment Level of Independence: Independent                 OT Problem List: Decreased activity tolerance;Impaired balance (sitting and/or standing);Decreased safety awareness;Decreased knowledge of use of DME or AE;Decreased knowledge of precautions;Pain      OT Treatment/Interventions: Self-care/ADL training;Therapeutic exercise;DME and/or AE instruction;Energy conservation;Therapeutic activities;Patient/family education;Balance training    OT Goals(Current goals can be found in the care plan section) Acute Rehab OT Goals Patient Stated Goal: To return to home.  OT Goal Formulation: With patient Time For Goal Achievement: 03/01/17 Potential to Achieve Goals: Good  OT Frequency: Min 2X/week   Barriers to D/C:            Co-evaluation              AM-PAC PT "6 Clicks" Daily Activity     Outcome  Measure Help from another person eating meals?: None Help from another person taking care of personal grooming?: None Help from another person toileting, which includes using toliet, bedpan, or urinal?: A Little Help from another person bathing (including washing, rinsing, drying)?: A Little Help from another person to put on and taking off regular upper body clothing?: None Help from another person to put on and taking off regular lower body clothing?: A Little 6 Click Score: 21   End of Session Nurse Communication: Mobility status;Precautions  Activity Tolerance: Patient limited by pain Patient left: in bed;with call bell/phone within reach  OT Visit Diagnosis: Pain Pain - Right/Left: Right Pain - part of body:  (abdomen)                Time: 1430-1450 OT Time Calculation (min): 20 min Charges:  OT General Charges $OT Visit: 1 Procedure OT Evaluation $OT Eval Moderate Complexity: 1 Procedure G-Codes:      Jeri Modena   OTR/L Pager: [phone] Office: [phone] .   Parke Poisson B 02/15/2017, 3:00 PM

## 2017-02-15 NOTE — Progress Notes (Addendum)
   Subjective: Ms. Dellie Burns is doing well over the interval but continued to have abdominal pain that radiates to the back. Denies similar episodes in the past. Nausea is improved over the interval. GI consulted, discussed the plan for further investigation with MRCP. No questions or concerns.   Objective: Vital signs in last 24 hours: Vitals:   02/14/17 2100 02/14/17 2130 02/14/17 2202 02/15/17 0400  BP: (!) 118/47 (!) 122/39 (!) 109/41 (!) 114/48  Pulse:  60 60 (!) 59  Resp: (!) _0 Temp:   99.1 F (37.3 C) 98.2 F (36.8 C)  TempSrc:   Oral   SpO2:  98% 96% 98%  Weight:      Height:       Physical Exam  Constitutional: She is oriented to person, place, and time and well-developed, well-nourished, and in no distress.  HENT:  Head: Normocephalic and atraumatic.  Eyes: Pupils are equal, round, and reactive to light. Conjunctivae are normal.  Cardiovascular: Normal rate, regular rhythm, normal heart sounds and intact distal pulses.   Pulmonary/Chest: Effort normal and breath sounds normal. She has no wheezes. She has no rales.  Abdominal:  Hypoactive bowel sounds, tenderness to palpation in the epigastric region, no peritoneal signs. No abnormal findings on visual inspection.   Musculoskeletal: She exhibits no edema.  Neurological: She is alert and oriented to person, place, and time.   Assessment/Plan: Active Problems:   Acute pancreatitis  1. Acute Pancreatitis  - Characteristic pain and Lipase >700  - Elevated Alk Phos and LFTs  - Normal triglycerides and denies EtOH use  - GI consulted, plan for a MRCP once AKI has improved, hepatitis panel.  - Will initiate trial of full liquid diet  2. AKI on ?CKD - Cr baseline unknown, prior records in 2016 showed Cr of 1.34  - Cr trend: 2.26 -> 2.06  - Continue IV hydration, repeat BMP in am   3. Diabetes Mellitus  - Last A1c 7.7 - On insulin regimen at home  - Will place on sliding scale   4. HTN  - Will hold  Enalapril due to AKI   5. Afib  - Rhythm controlled with Amiodarone  - On warfarin at home, restart per pharmacy  Dispo: Anticipated discharge in approximately 2-3 day(s).   Ina Homes, MD 02/15/2017, 11:14 AM My Pager: [phone]

## 2017-02-15 NOTE — Evaluation (Signed)
Physical Therapy Evaluation Patient Details Name: Daisy Pearson MRN: [id number] DOB: 1934-06-18 Today's Date: 02/15/2017   History of Present Illness  Daisy Pearson is a 83 y.o. female with past medical history of paroxysmal atrial fibrillation currently on Coumadin, diabetes, chronic kidney disease, history of coronary artery disease, history of pacemaker placement admitted to the hospital with the abdominal pain nausea and vomiting. She was found to have elevated lipase of 798. Normal ultrasound without any cholelithiasis.   W/u for acute pancreatitis.    Clinical Impression  Pt admitted with above diagnosis. Pt currently with functional limitations due to the deficits listed below (see PT Problem List). Pt limited in ambulation due to pain.  Only ambulated a short distance overall. Will follow acutely and should progress well as pt does well with RW overall.  Family supportive as well.   Pt will benefit from skilled PT to increase their independence and safety with mobility to allow discharge to the venue listed below.      Follow Up Recommendations Home health PT;Supervision/Assistance - 24 hour    Equipment Recommendations  None recommended by PT    Recommendations for Other Services       Precautions / Restrictions Precautions Precautions: None Restrictions Weight Bearing Restrictions: No Other Position/Activity Restrictions:  (Pt able to tolerate long sitting in recliner for ~1 hour. )      Mobility  Bed Mobility Overal bed mobility: Needs Assistance Bed Mobility: Supine to Sit     Supine to sit: Modified independent (Device/Increase time)     General bed mobility comments: used rail to come to EOB.  Transfers Overall transfer level: Needs assistance Equipment used: Rolling walker (2 wheeled) Transfers: Sit to/from Stand Sit to Stand: Min guard         General transfer comment: Pt required min cues for hand placement and to decrease velocity during  descent. Pt tends to demonstrate impulsivity during stand to sit transfers causing increase pain in abdomen.    Ambulation/Gait Ambulation/Gait assistance: Min assist Ambulation Distance (Feet): 15 Feet Assistive device: Rolling walker (2 wheeled) Gait Pattern/deviations: Antalgic;Trunk flexed;Step-through pattern;Decreased stride length Gait velocity: decreased. Gait velocity interpretation: Below normal speed for age/gender General Gait Details: Pt was able to ambulate with RW with impulsivity noted with pt trying to hurry to chair to sit down.  Fairly steady but decr safety due to pt rushing to chair due to pain.   Stairs            Wheelchair Mobility    Modified Rankin (Stroke Patients Only)       Balance Overall balance assessment: Needs assistance Sitting-balance support: Feet supported;No upper extremity supported Sitting balance-Leahy Scale: Fair     Standing balance support: Bilateral upper extremity supported;During functional activity Standing balance-Leahy Scale: Poor Standing balance comment: relies on RW for support but not heavily                             Pertinent Vitals/Pain Pain Assessment: 0-10 Pain Score: 8  Pain Location: RUQ of abdomen.  Pain Descriptors / Indicators: Aching;Grimacing;Guarding;Sharp Pain Intervention(s): Limited activity within patient's tolerance;Monitored during session;Repositioned;RN gave pain meds during session    [place] expects to be discharged to:: Private residence Living Arrangements: Children Available Help at Discharge: Family;Available 24 hours/day (lives with daughter who is there most of day) Type of Home: House Home Access: Stairs to enter Entrance Stairs-Rails: None Entrance Stairs-Number of Steps: 4  Home Layout: One level Home Equipment: Walker - 2 wheels;Wheelchair - [employer];Tub bench;Hand held shower head (bed rail for left side of bed) Additional  Comments: used wheelchair and RW outdoors per daughter.     Prior Function Level of Independence: Independent               Hand Dominance        Extremity/Trunk Assessment   Upper Extremity Assessment Upper Extremity Assessment: Defer to OT evaluation    Lower Extremity Assessment Lower Extremity Assessment: Generalized weakness    Cervical / Trunk Assessment Cervical / Trunk Assessment: Normal  Communication   Communication: No difficulties  Cognition Arousal/Alertness: Awake/alert Behavior During Therapy: WFL for tasks assessed/performed Overall Cognitive Status: Within Functional Limits for tasks assessed                                 General Comments: Pt willing to ambulate and perform PT and work through pain.       General Comments      Exercises General Exercises - Lower Extremity Ankle Circles/Pumps: AROM;20 reps;Seated;Both Quad Sets: AROM;Both;10 reps;Seated Short Arc Quad: AROM;Both;5 reps;Seated   Assessment/Plan    PT Assessment Patient needs continued PT services  PT Problem List Decreased strength;Decreased range of motion;Decreased activity tolerance;Decreased balance;Decreased mobility;Decreased knowledge of use of DME;Decreased safety awareness;Decreased knowledge of precautions;Pain       PT Treatment Interventions DME instruction;Gait training;Stair training;Functional mobility training;Therapeutic activities;Therapeutic exercise;Balance training;Patient/family education    PT Goals (Current goals can be found in the Care Plan section)  Acute Rehab PT Goals Patient Stated Goal: To return to home.  PT Goal Formulation: With patient Time For Goal Achievement: 03/01/17 Potential to Achieve Goals: Good    Frequency Min 3X/week   Barriers to discharge        Co-evaluation               AM-PAC PT "6 Clicks" Daily Activity  Outcome Measure Difficulty turning over in bed (including adjusting bedclothes, sheets  and blankets)?: Total Difficulty moving from lying on back to sitting on the side of the bed? : Total Difficulty sitting down on and standing up from a chair with arms (e.g., wheelchair, bedside commode, etc,.)?: A Little Help needed moving to and from a bed to chair (including a wheelchair)?: A Little Help needed walking in hospital room?: A Little Help needed climbing 3-5 steps with a railing? : Total 6 Click Score: 12    End of Session Equipment Utilized During Treatment: Gait belt Activity Tolerance: Patient limited by pain Patient left: in chair;with call bell/phone within reach;with family/visitor present Nurse Communication: Mobility status;Patient requests pain meds PT Visit Diagnosis: Pain;Muscle weakness (generalized) (M62.81);Dizziness and giddiness (R42) Pain - Right/Left: Right Pain - part of body:  (RUQ of abdomen.)    Time: 4008-6761 PT Time Calculation (min) (ACUTE ONLY): 25 min   Charges:   PT Evaluation $PT Eval Moderate Complexity: 1 Procedure PT Treatments $Therapeutic Exercise: 8-22 mins   PT G Codes:        Kaan Tosh,PT Acute Rehabilitation [phone] (pager)   Denice Paradise 02/15/2017, 12:43 PM

## 2017-02-15 NOTE — Consult Note (Addendum)
Referring Provider:  Dr. Kathrynn Humble  Primary Care Physician:  Karleen Hampshire., MD Primary Gastroenterologist:  Althia Forts  Reason for Consultation:  Acute pancreatitis  HPI: Daisy Pearson is a 83 y.o. female with past medical history of paroxysmal atrial fibrillation currently on Coumadin, diabetes, chronic kidney disease, history of coronary artery disease, history of pacemaker placement admitted to the hospital with the abdominal pain nausea and vomiting. She was found to have elevated lipase of 798. Normal ultrasound without any cholelithiasis. CBD of 4 mm. GI is consulted for further evaluation.  Patient seen and examined at bedside. Sitting comfortably in the recliner. Family at bedside. According to patient, she started feeling nausea and vomiting 2 days ago. Described as dark green color vomiting. No bowel movement in last 2 days. She was also having right upper quadrant epigastric pain without any radiation. She denied any fever or chills. Her symptoms are improving but she is requiring ongoing pain medication. Denied any black stool or bright blood per rectum.     Past Medical History:  Diagnosis Date  . Arthritis   . Diabetes mellitus without complication ([place])   . Fibromyalgia   . Hypertension   . Renal disorder     Past Surgical History:  Procedure Laterality Date  . ADRENALECTOMY  2006  . AORTIC VALVE REPLACEMENT (AVR)/CORONARY ARTERY BYPASS GRAFTING (CABG)    . PACEMAKER PLACEMENT  04/05/2013   medtonic Serial# [id number] Lemmie Evens [id number], SERIAL [id number]  . THYROIDECTOMY  1964    Prior to Admission medications   Medication Sig Start Date End Date Taking? Authorizing Provider  acetaminophen (TYLENOL) 325 MG tablet Take 650 mg by mouth every 6 (six) hours as needed for headache (pain).   Yes [provider]  alendronate (FOSAMAX) 70 MG tablet Take 70 mg by mouth every Saturday. Take with a full glass of water on an empty stomach.   Yes [provider]  amiodarone (PACERONE) 200 MG tablet Take 200 mg by mouth daily.   Yes [provider]  atorvastatin (LIPITOR) 40 MG tablet Take 40 mg by mouth daily.   Yes [provider]  calcitRIOL (ROCALTROL) 0.5 MCG capsule Take 0.5 mcg by mouth daily.   Yes [provider]  cetirizine (ZYRTEC) 10 MG tablet Take 10 mg by mouth daily as needed for allergies.   Yes [provider]  Cholecalciferol (VITAMIN D) 2000 units tablet Take 2,000 Units by mouth daily.   Yes [provider]  enalapril (VASOTEC) 2.5 MG tablet Take 2.5 mg by mouth daily.   Yes [provider]  EPINEPHrine (EPIPEN 2-PAK) 0.3 mg/0.3 mL IJ SOAJ injection Inject 0.3 mg into the muscle once as needed (severe allergic reaction).   Yes [provider]  Insulin Detemir (LEVEMIR FLEXTOUCH) 100 UNIT/ML Pen Inject 18 Units into the skin 2 (two) times daily.   Yes [provider]  Insulin Glulisine (APIDRA SOLOSTAR) 100 UNIT/ML Solostar Pen Inject 12 Units into the skin See admin instructions. Inject 12 units subcutaneously three times daily before meals - plus adjustment if CBG >200 - 1 unit for every 20   Yes [provider]  metaxalone (SKELAXIN) 800 MG tablet Take 800 mg by mouth 3 (three) times daily as needed for muscle spasms.   Yes [provider]  Methylfol-Methylcob-Acetylcyst (CEREFOLIN NAC) 6-2-600 MG TABS Take 1 tablet by mouth daily.   Yes [provider]  metoprolol tartrate (LOPRESSOR) 25 MG tablet Take 25 mg by mouth 2 (two) times daily.  Yes [provider]  mirtazapine (REMERON) 15 MG tablet Take 15 mg by mouth at bedtime.   Yes [provider]  nitroGLYCERIN (NITROSTAT) 0.4 MG SL tablet Place 0.4 mg under the tongue every 5 (five) minutes as needed for chest pain.   Yes [provider]  Omega-3 Fatty Acids (OMEGA 3 PO) Take 1 capsule by mouth daily.   Yes [provider]   oxyCODONE-acetaminophen (PERCOCET/ROXICET) 5-325 MG tablet Take 1 tablet by mouth 2 (two) times daily as needed for severe pain.   Yes [provider]  QUEtiapine (SEROQUEL) 25 MG tablet Take 25 mg by mouth at bedtime.   Yes [provider]  warfarin (JANTOVEN) 4 MG tablet Take 2-4 mg by mouth See admin instructions. Take 1/2 tablet (2 mg) by mouth on Saturday at bedtime, take 1 tablet (4 mg) on all other nights   Yes [provider]    Scheduled Meds: . heparin  5,000 Units Subcutaneous Q8H  . insulin aspart  0-9 Units Subcutaneous TID WC   Continuous Infusions: . sodium chloride 150 mL/hr at 02/15/17 0700   PRN Meds:.HYDROmorphone (DILAUDID) injection, ondansetron  Allergies as of 02/14/2017 - Review Complete 02/14/2017  Allergen Reaction Noted  . Esomeprazole sodium Swelling 01/18/2015  . Penicillins Anaphylaxis and Rash 02/14/2017  . Clindamycin/lincomycin Hives 02/14/2017  . Hydrocodone Other (See Comments) 02/14/2017  . Nsaids Hives and Other (See Comments) 02/14/2017  . Valsartan Swelling 02/14/2017  . Prilosec [omeprazole] Swelling and Rash 02/14/2017    History reviewed. No pertinent family history.  Social History   Social History  . Marital status: Married    Spouse name: N/A  . Number of children: N/A  . Years of education: N/A   Occupational History  . Not on file.   Social History Main Topics  . Smoking status: Never Smoker  . Smokeless tobacco: Never Used  . Alcohol use No  . Drug use: No  . Sexual activity: Not on file   Other Topics Concern  . Not on file   Social History Narrative  . No narrative on file    Review of Systems: Review of Systems  Constitutional: Negative for chills and fever.  HENT: Negative for hearing loss and tinnitus.   Eyes: Negative for blurred vision and double vision.  Respiratory: Negative for cough and hemoptysis.   Cardiovascular: Negative for chest pain and palpitations.   Gastrointestinal: Positive for abdominal pain, nausea and vomiting. Negative for blood in stool and melena.  Genitourinary: Negative for dysuria and urgency.  Musculoskeletal: Positive for myalgias. Negative for back pain.  Skin: Negative for rash.  Neurological: Negative for seizures and loss of consciousness.  Endo/Heme/Allergies: Does not bruise/bleed easily.  Psychiatric/Behavioral: Negative for hallucinations and suicidal ideas.  .ros  Physical Exam: Vital signs: Vitals:   02/14/17 2202 02/15/17 0400  BP: (!) 109/41 (!) 114/48  Pulse: 60 (!) 59  Resp: 19 18  Temp: 99.1 F (37.3 C) 98.2 F (36.8 C)   Last BM Date: 02/13/17 Physical Exam  Constitutional: She is oriented to person, place, and time. She appears well-developed and well-nourished. No distress.  HENT:  Head: Normocephalic and atraumatic.  Mouth/Throat: Oropharynx is clear and moist.  Eyes: EOM are normal. No scleral icterus.  Neck: Normal range of motion. Neck supple. No thyromegaly present.  Cardiovascular: Normal rate, regular rhythm and normal heart sounds.   Pulmonary/Chest: Effort normal and breath sounds normal. No respiratory distress.  Anterior exam only  Abdominal: Soft. Bowel sounds  are normal. She exhibits no distension. There is tenderness. There is no rebound and no guarding.  Right upper quadrant and epigastric tenderness to palpation  Musculoskeletal: Normal range of motion. She exhibits no edema.  Neurological: She is alert and oriented to person, place, and time.  Skin: Skin is warm. No erythema.  Psychiatric: She has a normal mood and affect. Her behavior is normal. Thought content normal.  Vitals reviewed.   GI:  Lab Results:  Recent Labs  02/14/17 1646 02/15/17 0316  WBC 18.5* 11.4*  HGB 13.0 10.6*  HCT 39.4 33.1*  PLT 261 176   BMET  Recent Labs  02/14/17 1646 02/14/17 2205 02/15/17 0316  NA 137  --  139  K 4.4  --  4.6  CL 104  --  107  CO2 20*  --  26  GLUCOSE 271*   --  224*  BUN 35*  --  38*  CREATININE 2.26* 2.21* 2.06*  CALCIUM 9.5  --  8.6*   LFT  Recent Labs  02/15/17 0316  PROT 5.3*  ALBUMIN 2.9*  AST 252*  ALT 284*  ALKPHOS 111  BILITOT 0.8   PT/INR  Recent Labs  02/15/17 0316  LABPROT 25.4*  INR 2.26     Studies/Results: Dg Chest 2 View  Result Date: 02/14/2017 CLINICAL DATA:  Mid chest pain and upper abdominal pain beginning last night. Vomiting and shortness of breath. EXAM: CHEST  2 VIEW COMPARISON:  05/03/2015 FINDINGS: Pacemaker appears unchanged. Previous median sternotomy and CABG. Heart size is stable with left ventricular prominence. Aortic atherosclerosis. The pulmonary vascularity is normal. No infiltrate, collapse or effusion. Old vertebral augmentation. IMPRESSION: No active disease.  Previous CABG.  Pacemaker. Electronically Signed   By: Nelson Chimes M.D.   On: 02/14/2017 17:08   US Abdomen Limited Ruq  Result Date: 02/14/2017 CLINICAL DATA:  Abdominal pain EXAM: ULTRASOUND ABDOMEN LIMITED RIGHT UPPER QUADRANT COMPARISON:  No comparison studies available. FINDINGS: Gallbladder: No gallstones or gallbladder wall thickening. No pericholecystic fluid. The sonographer reports no sonographic Murphy's sign. Common bile duct: Diameter: 4 mm Liver: No focal lesion identified. Within normal limits in parenchymal echogenicity. IMPRESSION: No acute findings.  No evidence for cholelithiasis. Electronically Signed   By: Misty Stanley M.D.   On: 02/14/2017 18:55    Impression/Plan: - Acute pancreatitis. Etiology undetermined. - Dark green vomiting. No bowel movement in last 2 days. Denied any black stool or bright red blood per rectum. - Elevated LFTs. Could be from passed gallstone. - Anemia. Baseline unknown. -  atrial fibrillation.  Coumadin  Currently on hold. INR 2.26 today - History of pacemaker placement in 2014.  Recommendations ------------------------ - Patient presentation could be from acute pancreatitis given  elevated lipase and epigastric pain. Etiology of pancreatitis undetermined. Normal triglyceride. No alcohol use. Ultrasound showed no gallstones. Patient had MRI done in 2016. - Recommend MRI-MRCP for further evaluation after kidney function stabilizes.. Hepatitis panel - No evidence of active GI bleed. She has allergy to Nexium and Prilosec in the past but was able to tolerate Protonix. Recommend starting Protonix if any evidence of GI bleed or if she has ongoing abdominal pain. - Trial of full liquid diet  - GI will follow    LOS: 1 day   Otis Brace  MD, FACP 02/15/2017, 10:47 AM  Pager [phone] If no answer or after 5 PM call [phone]

## 2017-02-15 NOTE — Progress Notes (Signed)
Inpatient Diabetes Program Recommendations  AACE/ADA: New Consensus Statement on Inpatient Glycemic Control (2015)  Target Ranges:  Prepandial:   less than 140 mg/dL      Peak postprandial:   less than 180 mg/dL (1-2 hours)      Critically ill patients:  140 - 180 mg/dL   Results for Daisy Pearson, Daisy Pearson (MRN [id number]) as of 02/15/2017 10:05  Ref. Range 02/15/2017 01:10 02/15/2017 04:26 02/15/2017 08:04  Glucose-Capillary Latest Ref Range: 65 - 99 mg/dL 242 (H) 195 (H) 165 (H)   Review of Glycemic Control  Diabetes history: DM2 Outpatient Diabetes medications: Levemir 18 units BID, Apidra 12 units TID with meals plus additional for correction Current orders for Inpatient glycemic control: Novolog 0-9 units TID with meals  Inpatient Diabetes Program Recommendations: Correction (SSI): If patient will continue to be NPO, please consider changing frequency of CBGs and Novolog to Q4H.  Thanks, Barnie Alderman, RN, MSN, CDE Diabetes Coordinator Inpatient Diabetes Program [phone] (Team Pager from 8am to 5pm)

## 2017-02-15 NOTE — Progress Notes (Signed)
Placed a copy of pt's pacemaker card on pt's shadow chart. Pt has a Medtronic Advisa DR MRI SureScan Pacemaker.  GI planning for possible MRI/MRCP tomorrow if creat improves. Please note pt has this pacemaker and can have MRIs done with this type pacer pt and family state.

## 2017-02-16 DIAGNOSIS — N179 Acute kidney failure, unspecified: Secondary | ICD-10-CM | POA: Diagnosis present

## 2017-02-16 DIAGNOSIS — I10 Essential (primary) hypertension: Secondary | ICD-10-CM | POA: Diagnosis present

## 2017-02-16 DIAGNOSIS — I4891 Unspecified atrial fibrillation: Secondary | ICD-10-CM | POA: Diagnosis present

## 2017-02-16 DIAGNOSIS — I480 Paroxysmal atrial fibrillation: Secondary | ICD-10-CM | POA: Diagnosis present

## 2017-02-16 DIAGNOSIS — E119 Type 2 diabetes mellitus without complications: Secondary | ICD-10-CM

## 2017-02-16 LAB — PROTIME-INR
INR: 1.72
PROTHROMBIN TIME: 20.3 s — AB (ref 11.4–15.2)

## 2017-02-16 LAB — GLUCOSE, CAPILLARY
GLUCOSE-CAPILLARY: 169 mg/dL — AB (ref 65–99)
GLUCOSE-CAPILLARY: 226 mg/dL — AB (ref 65–99)
Glucose-Capillary: 246 mg/dL — ABNORMAL HIGH (ref 65–99)
Glucose-Capillary: 216 mg/dL — ABNORMAL HIGH (ref 65–99)

## 2017-02-16 LAB — HEMOGLOBIN A1C
HEMOGLOBIN A1C: 6.3 % — AB (ref 4.8–5.6)
MEAN PLASMA GLUCOSE: 134 mg/dL

## 2017-02-16 LAB — COMPREHENSIVE METABOLIC PANEL
ALT: 175 U/L — ABNORMAL HIGH (ref 14–54)
AST: 88 U/L — AB (ref 15–41)
Albumin: 2.9 g/dL — ABNORMAL LOW (ref 3.5–5.0)
Alkaline Phosphatase: 102 U/L (ref 38–126)
Anion gap: 10 (ref 5–15)
BILIRUBIN TOTAL: 1.2 mg/dL (ref 0.3–1.2)
BUN: 29 mg/dL — AB (ref 6–20)
CHLORIDE: 111 mmol/L (ref 101–111)
CO2: 19 mmol/L — ABNORMAL LOW (ref 22–32)
Calcium: 8.3 mg/dL — ABNORMAL LOW (ref 8.9–10.3)
Creatinine, Ser: 1.52 mg/dL — ABNORMAL HIGH (ref 0.44–1.00)
GFR, EST AFRICAN AMERICAN: 36 mL/min — AB (ref >60)
GFR, EST NON AFRICAN AMERICAN: 31 mL/min — AB (ref >60)
Glucose, Bld: 195 mg/dL — ABNORMAL HIGH (ref 65–99)
POTASSIUM: 4.2 mmol/L (ref 3.5–5.1)
Sodium: 140 mmol/L (ref 135–145)
TOTAL PROTEIN: 5.8 g/dL — AB (ref 6.5–8.1)

## 2017-02-16 LAB — CBC
HCT: 33.8 % — ABNORMAL LOW (ref 36.0–46.0)
Hemoglobin: 10.8 g/dL — ABNORMAL LOW (ref 12.0–15.0)
MCH: 31.4 pg (ref 26.0–34.0)
MCHC: 32 g/dL (ref 30.0–36.0)
MCV: 98.3 fL (ref 78.0–100.0)
PLATELETS: 153 10*3/uL (ref 150–400)
RBC: 3.44 MIL/uL — AB (ref 3.87–5.11)
RDW: 13.3 % (ref 11.5–15.5)
WBC: 13.1 10*3/uL — ABNORMAL HIGH (ref 4.0–10.5)

## 2017-02-16 LAB — HEPATITIS PANEL, ACUTE
HCV Ab: <0.1 s/co ratio (ref 0.0–0.9)
Hep A IgM: NEGATIVE
Hep B C IgM: NEGATIVE
Hepatitis B Surface Ag: NEGATIVE

## 2017-02-16 MED ORDER — WARFARIN - PHARMACIST DOSING INPATIENT
Freq: Every day | Status: DC
  Administered 2017-02-17: 17:00:00

## 2017-02-16 MED ORDER — WARFARIN SODIUM 4 MG PO TABS
4.0000 mg | ORAL_TABLET | Freq: Once | ORAL | Status: AC
  Administered 2017-02-16: 4 mg via ORAL
  Filled 2017-02-16: qty 1

## 2017-02-16 NOTE — Progress Notes (Signed)
[facility] Gastroenterology Progress Note  Daisy Pearson 83 y.o. [birth year]  CC:  Pancreatitis   Subjective: Patient is able to tolerate diet this morning. Continues to have abdominal pain. Denied any blood in the stool or black stool.  ROS : Positive for weakness. Negative for chest pain shortness of breath   Objective: Vital signs in last 24 hours: Vitals:   02/15/17 2201 02/16/17 0556  BP: (!) 152/45 138/81  Pulse: 64 (!) 59  Resp:    Temp: 99.5 F (37.5 C) 98.6 F (37 C)    Physical Exam:  Constitutional: She is oriented to person, place, and time. She appears well-developed and well-nourished. No distress.  HENT:  Head: Normocephalic and atraumatic.  Eyes: EOM are normal. No scleral icterus.  Neck: Normal range of motion. Neck supple. No thyromegaly present.  Cardiovascular: Normal rate, regular rhythm and normal heart sounds.   Pulmonary/Chest: Effort normal and breath sounds normal. No respiratory distress.  Anterior exam only  Abdominal: Soft. Bowel sounds are normal. She exhibits no distension.  There is no rebound and no guarding.  Right upper quadrant and epigastric tenderness to palpation  Musculoskeletal: Normal range of motion. She exhibits no edema.  Neurological: She is alert and oriented to person, place, and time.  Psychiatric: She has a normal mood and affect. Her behavior is normal. Thought content normal.  Vitals reviewed.  Lab Results:  Recent Labs  02/15/17 0316 02/16/17 0643  NA 139 140  K 4.6 4.2  CL 107 111  CO2 26 19*  GLUCOSE 224* 195*  BUN 38* 29*  CREATININE 2.06* 1.52*  CALCIUM 8.6* 8.3*    Recent Labs  02/15/17 0316 02/16/17 0643  AST 252* 88*  ALT 284* 175*  ALKPHOS 111 102  BILITOT 0.8 1.2  PROT 5.3* 5.8*  ALBUMIN 2.9* 2.9*    Recent Labs  02/15/17 0316 02/16/17 0643  WBC 11.4* 13.1*  HGB 10.6* 10.8*  HCT 33.1* 33.8*  MCV 96.2 98.3  PLT 176 153    Recent Labs  02/15/17 0316 02/16/17 0643  LABPROT  25.4* 20.3*  INR 2.26 1.72      Assessment/Plan: - Acute pancreatitis. Etiology undetermined. - Dark green vomiting. No bowel movement in last 2 days. Denied any black stool or bright red blood per rectum. - Elevated LFTs. Could be from passed gallstone. - Anemia. Baseline unknown. -  atrial fibrillation.  Coumadin  Currently on hold. INR 2.26 today - History of pacemaker placement in 2014.  Recommendations ------------------------ - Patient presentation could be from acute pancreatitis given elevated lipase and epigastric pain. Etiology of pancreatitis undetermined. Normal triglyceride. No alcohol use. Ultrasound showed no gallstones. - Patient with mildly elevated kidney function. According to Daughter  she has a chronic kidney disease. They are willing to have a MRI done without contrast. Hepatitis panel negative.  - No evidence of active GI bleed. She has allergy to Nexium and Prilosec in the past but was able to tolerate Protonix. Recommend starting Protonix if any evidence of GI bleed or if she has ongoing abdominal pain. - Slowly advance diet to low-fat diet as tolerated  - GI will follow   Otis Brace MD, [place] 02/16/2017, 10:04 AM  Pager [phone]  If no answer or after 5 PM call [phone]

## 2017-02-16 NOTE — Progress Notes (Signed)
ANTICOAGULATION CONSULT NOTE - Initial Consult  Pharmacy Consult for warfarin  Indication: atrial fibrillation  Allergies  Allergen Reactions  . Esomeprazole Sodium Swelling  . Penicillins Anaphylaxis and Rash    Has patient had a PCN reaction causing immediate rash, facial/tongue/throat swelling, SOB or lightheadedness with hypotension: Yes Has patient had a PCN reaction causing severe rash involving mucus membranes or skin necrosis: No Has patient had a PCN reaction that required hospitalization: No Has patient had a PCN reaction occurring within the last 10 years: No If all of the above answers are "NO", then may proceed with Cephalosporin use.  . Clindamycin/Lincomycin Hives       . Hydrocodone Other (See Comments)    Makes her crazy  . Nsaids Hives and Other (See Comments)    kidney failure  . Valsartan Swelling  . Prilosec [Omeprazole] Swelling and Rash    Patient Measurements: Height: 5\' 4"  (162.6 cm) Weight: 204 lb (92.5 kg) IBW/kg (Calculated) : 54.7   Vital Signs: Temp: 98.6 F (37 C) (07/24 1241) Temp Source: Oral (07/24 1241) BP: 147/60 (07/24 1241) Pulse Rate: 62 (07/24 1241)  Labs:  Recent Labs  02/14/17 1646 02/14/17 2205 02/15/17 0316 02/16/17 0643  HGB 13.0  --  10.6* 10.8*  HCT 39.4  --  33.1* 33.8*  PLT 261  --  176 153  LABPROT  --   --  25.4* 20.3*  INR  --   --  2.26 1.72  CREATININE 2.26* 2.21* 2.06* 1.52*    Estimated Creatinine Clearance: 32 mL/min (A) (by C-G formula based on SCr of 1.52 mg/dL (H)).   Medical History: Past Medical History:  Diagnosis Date  . Arthritis   . Diabetes mellitus without complication ([place])   . Fibromyalgia   . Hypertension   . Renal disorder      Assessment: 83 yo female on warfarin at home for afib. Patient had been NPO due to pancreatitis and is now eating. GI evaluated with MRCP and saw no evidence of GI bleed.  Pharmacy has been consulted to re-start warfarin. INR today is subtherapeutic at  1.72. H/H is low but stable. No signs of bleeding have been noted.   PTA warfarin dose: 2 mg on Sat, 4 mg all other days Last dose: 7/21  Goal of Therapy:  INR 2-3 Monitor platelets by anticoagulation protocol: Yes   Plan:  Warfarin 4 mg X 1 tonight  Daily INR Monitor signs/symptoms of bleeding  Susa Raring, PharmD PGY2 Infectious Diseases Pharmacy Resident  Pager: [phone] 02/16/2017,7:18 PM

## 2017-02-16 NOTE — Care Management Note (Signed)
Case Management Note  Patient Details  Name: Daisy Pearson MRN: [id number] Date of Birth: 1933/10/05  Subjective/Objective:   84 yr old female admitted with acute pancreatitis.                  Action/Plan:  Case manager spoke with patient and her family concerning discharge plan. Choice for [place] was offered, referral called to Stevie Kern, [place] Liaison. Patient has good family support at discharge. No further Case Management needs identified.   Expected Discharge Date:   02/16/17               Expected Discharge Plan:  [place]  In-House Referral:  NA  Discharge planning Services  CM Consult  Post Acute Care Choice:  Home Health Choice offered to:  Patient, Adult Children  DME Arranged:  N/A (has RW,3in1, shower bench and wheelchair) DME Agency:  NA  HH Arranged:  PT HH Agency:  [place]  Status of Service:  Completed, signed off  If discussed at [place] of Stay Meetings, dates discussed:    Additional Comments:  Ninfa Meeker, RN 02/16/2017, 10:09 AM

## 2017-02-16 NOTE — Progress Notes (Signed)
   Subjective: Daisy Pearson is doing better this AM. N/V and epigastric pain is getting better. We discussed that her renal function continues to improved. She was able to eat some ice-cream, milk and coffee this AM. No questions or concerns.   Objective: Vital signs in last 24 hours: Vitals:   02/15/17 0400 02/15/17 1454 02/15/17 2201 02/16/17 0556  BP: (!) 114/48 134/63 (!) 152/45 138/81  Pulse: (!) 59 61 64 (!) 59  Resp: 18 18    Temp: 98.2 F (36.8 C) 98.4 F (36.9 C) 99.5 F (37.5 C) 98.6 F (37 C)  TempSrc:  Oral Oral Other (Comment)  SpO2: 98% 98% 97% 98%  Weight:      Height:       Physical Exam  Constitutional: She appears well-developed and well-nourished.  HENT:  Head: Normocephalic and atraumatic.  Eyes: Pupils are equal, round, and reactive to light. Conjunctivae are normal.  Cardiovascular: Normal rate, regular rhythm, normal heart sounds and intact distal pulses.   Pulmonary/Chest: Effort normal and breath sounds normal. She has no wheezes. She has no rales.  Abdominal: Soft. Bowel sounds are normal. There is tenderness (Epigastric pain much improved from prior exam). There is no rebound and no guarding.  Musculoskeletal: She exhibits no edema.  Neurological: She is alert.  Oriented to person but not place.    Assessment/Plan:  Active Problems:   Acute pancreatitis  1. Acute Pancreatitis  - Epigastric pain improving, able to eat minimal  - Normal triglycerides and denies EtOH use  - GI consulted, plan for a MRCP today  - Will progress diet as tolerated  2. AKI on ?CKD - Cr baseline unknown, prior records in 2016 showed Cr of 1.34  - Cr trend: 2.26 -> 2.06 -> 1.52  3. Diabetes Mellitus  - Last A1c 7.7 - On insulin regimen at home  - Will place on sliding scale   4. HTN  - Will hold Enalapril due to AKI   5. Afib  - Rhythm controlled with Amiodarone  - On warfarin at home, will hold for now  Dispo: Anticipated discharge in approximately  1-2 day(s).   Ina Homes, MD 02/16/2017, 11:07 AM My Pager: [phone]

## 2017-02-16 NOTE — Progress Notes (Signed)
Internal Medicine Attending:   I saw and examined the patient. I reviewed the resident's note and I agree with the resident's findings and plan as documented in the resident's note.  Patient feels better this morning but still complains of intermittent abdominal pain. She was able to tolerate some liquids this morning. Patient was initially admitted for severe abdominal pain and found to have acute pancreatitis likely secondary to passed gallstone. She still has some mild epigastric tenderness on exam. GI follow-up and her conditions appreciated. Patient scheduled for an MRCP today. She was also noted to have an acute kidney injury on admission likely secondary to dehydration. Her creatinine has continued to improve and is down to 1.5 today (baseline approximately 1.3 in 2016). We'll continue with IV fluids and monitor patient. We'll consider adding Protonix if she has persistent abdominal pain.

## 2017-02-17 ENCOUNTER — Inpatient Hospital Stay (HOSPITAL_COMMUNITY): Payer: Medicare Other

## 2017-02-17 LAB — COMPREHENSIVE METABOLIC PANEL
ALT: 120 U/L — ABNORMAL HIGH (ref 14–54)
AST: 42 U/L — ABNORMAL HIGH (ref 15–41)
Albumin: 2.8 g/dL — ABNORMAL LOW (ref 3.5–5.0)
Alkaline Phosphatase: 98 U/L (ref 38–126)
Anion gap: 5 (ref 5–15)
BILIRUBIN TOTAL: 0.8 mg/dL (ref 0.3–1.2)
BUN: 22 mg/dL — ABNORMAL HIGH (ref 6–20)
CO2: 25 mmol/L (ref 22–32)
CREATININE: 1.35 mg/dL — AB (ref 0.44–1.00)
Calcium: 8.5 mg/dL — ABNORMAL LOW (ref 8.9–10.3)
Chloride: 109 mmol/L (ref 101–111)
GFR, EST AFRICAN AMERICAN: 41 mL/min — AB (ref >60)
GFR, EST NON AFRICAN AMERICAN: 36 mL/min — AB (ref >60)
Glucose, Bld: 211 mg/dL — ABNORMAL HIGH (ref 65–99)
POTASSIUM: 5 mmol/L (ref 3.5–5.1)
Sodium: 139 mmol/L (ref 135–145)
TOTAL PROTEIN: 5.6 g/dL — AB (ref 6.5–8.1)

## 2017-02-17 LAB — GLUCOSE, CAPILLARY
GLUCOSE-CAPILLARY: 139 mg/dL — AB (ref 65–99)
Glucose-Capillary: 192 mg/dL — ABNORMAL HIGH (ref 65–99)
Glucose-Capillary: 196 mg/dL — ABNORMAL HIGH (ref 65–99)
Glucose-Capillary: 146 mg/dL — ABNORMAL HIGH (ref 65–99)

## 2017-02-17 LAB — PROTIME-INR
INR: 2.06
PROTHROMBIN TIME: 23.5 s — AB (ref 11.4–15.2)

## 2017-02-17 MED ORDER — WARFARIN SODIUM 4 MG PO TABS
4.0000 mg | ORAL_TABLET | Freq: Once | ORAL | Status: AC
  Administered 2017-02-17: 4 mg via ORAL
  Filled 2017-02-17: qty 1

## 2017-02-17 MED ORDER — HYDROMORPHONE HCL 1 MG/ML IJ SOLN
1.0000 mg | Freq: Four times a day (QID) | INTRAMUSCULAR | Status: DC | PRN
  Administered 2017-02-17 – 2017-02-18 (×2): 1 mg via INTRAVENOUS
  Filled 2017-02-17 (×2): qty 1

## 2017-02-17 MED ORDER — ONDANSETRON 4 MG PO TBDP
4.0000 mg | ORAL_TABLET | Freq: Four times a day (QID) | ORAL | Status: DC
  Administered 2017-02-17 – 2017-02-18 (×3): 4 mg via ORAL
  Filled 2017-02-17 (×3): qty 1

## 2017-02-17 MED ORDER — PANTOPRAZOLE SODIUM 40 MG PO TBEC
40.0000 mg | DELAYED_RELEASE_TABLET | Freq: Every day | ORAL | Status: DC
  Administered 2017-02-17: 40 mg via ORAL

## 2017-02-17 MED ORDER — OXYCODONE-ACETAMINOPHEN 5-325 MG PO TABS
1.0000 | ORAL_TABLET | Freq: Four times a day (QID) | ORAL | Status: DC | PRN
  Administered 2017-02-17: 1 via ORAL
  Filled 2017-02-17: qty 1

## 2017-02-17 MED ORDER — HYDROMORPHONE BOLUS VIA INFUSION
1.0000 mg | Freq: Four times a day (QID) | INTRAVENOUS | Status: DC | PRN
Start: 2017-02-17 — End: 2017-02-17

## 2017-02-17 MED ORDER — SODIUM CHLORIDE 0.9 % IV SOLN
INTRAVENOUS | Status: DC
  Administered 2017-02-17: 17:00:00 via INTRAVENOUS

## 2017-02-17 MED ORDER — FAMOTIDINE 20 MG PO TABS
20.0000 mg | ORAL_TABLET | Freq: Every day | ORAL | Status: DC
  Administered 2017-02-17 – 2017-02-23 (×6): 20 mg via ORAL
  Filled 2017-02-17 (×7): qty 1

## 2017-02-17 MED ORDER — SODIUM CHLORIDE 0.9 % IV SOLN: INTRAVENOUS | Status: DC

## 2017-02-17 NOTE — Progress Notes (Signed)
Inpatient Diabetes Program Recommendations  AACE/ADA: New Consensus Statement on Inpatient Glycemic Control (2015)  Target Ranges:  Prepandial:   less than 140 mg/dL      Peak postprandial:   less than 180 mg/dL (1-2 hours)      Critically ill patients:  140 - 180 mg/dL   Lab Results  Component Value Date   GLUCAP 192 (H) 02/17/2017   HGBA1C 6.3 (H) 02/15/2017    Review of Glycemic Control Results for Daisy Pearson, Daisy Pearson (MRN [id number]) as of 02/17/2017 09:14  Ref. Range 02/16/2017 08:09 02/16/2017 12:02 02/16/2017 16:45 02/16/2017 22:18 02/17/2017 07:46  Glucose-Capillary Latest Ref Range: 65 - 99 mg/dL 169 (H) 246 (H) 226 (H) 216 (H) 192 (H)   Diabetes history: DM2 Outpatient Diabetes medications: Levemir 18 units BID, Apidra 12 units TID with meals plus additional for correction Current orders for Inpatient glycemic control: Novolog 0-9 units TID with meals  Inpatient Diabetes Program Recommendations: Noted patient eating improved. Please consider: -Novolog 3 units meal coverage tid if eats 50% Will follow.  Thank you, Nani Gasser. Delonta Yohannes, RN, MSN, CDE  Diabetes Coordinator Inpatient Glycemic Control Team Team Pager [phone] (8am-5pm) 02/17/2017 9:35 AM

## 2017-02-17 NOTE — Progress Notes (Signed)
[facility] Gastroenterology Progress Note  Daisy Pearson 83 y.o. 1934/04/08  CC:  Pancreatitis   Subjective: Patient continues to generalized abdominal discomfort along with nausea. Awaiting MRCP today.  ROS : Positive for weakness. Negative for chest pain shortness of breath   Objective: Vital signs in last 24 hours: Vitals:   02/17/17 0459 02/17/17 0529  BP: (!) 175/64 (!) 160/50  Pulse: 67 65  Resp:    Temp: 98.3 F (36.8 C)     Physical Exam:  Constitutional: She is oriented to person, place, and time. She appears well-developed and well-nourished. No distress.  HENT:  Head: Normocephalic and atraumatic.  Eyes: EOM are normal. No scleral icterus.  Neck: Normal range of motion. Neck supple. No thyromegaly present.  Cardiovascular: Normal rate, regular rhythm and normal heart sounds.   Pulmonary/Chest: Effort normal and breath sounds normal. No respiratory distress.  Anterior exam only  Abdominal: Soft. Bowel sounds are normal. She exhibits no distension.  There is no rebound and no guarding.  Right upper quadrant and epigastric tenderness to palpation  Musculoskeletal: Normal range of motion. She exhibits no edema.  Neurological: She is alert and oriented to person, place, and time.  Psychiatric: She has a normal mood and affect. Her behavior is normal. Thought content normal.  Vitals reviewed.  Lab Results:  Recent Labs  02/16/17 0643 02/17/17 0432  NA 140 139  K 4.2 5.0  CL 111 109  CO2 19* 25  GLUCOSE 195* 211*  BUN 29* 22*  CREATININE 1.52* 1.35*  CALCIUM 8.3* 8.5*    Recent Labs  02/16/17 0643 02/17/17 0432  AST 88* 42*  ALT 175* 120*  ALKPHOS 102 98  BILITOT 1.2 0.8  PROT 5.8* 5.6*  ALBUMIN 2.9* 2.8*    Recent Labs  02/15/17 0316 02/16/17 0643  WBC 11.4* 13.1*  HGB 10.6* 10.8*  HCT 33.1* 33.8*  MCV 96.2 98.3  PLT 176 153    Recent Labs  02/16/17 0643 02/17/17 0432  LABPROT 20.3* 23.5*  INR 1.72 2.06       Assessment/Plan: - Acute pancreatitis. Etiology undetermined. - Dark green vomiting. No bowel movement in last 2 days. Denied any black stool or bright red blood per rectum. - Elevated LFTs. Could be from passed gallstone.Improving - Anemia. Baseline unknown. -  atrial fibrillation.  Coumadin  Currently on hold. INR 2.06 today - History of pacemaker placement in 2014.  Recommendations ------------------------ - Patient presentation could be from acute pancreatitis given elevated lipase and epigastric pain. Etiology of pancreatitis undetermined. Normal triglyceride. No alcohol use. Ultrasound showed no gallstones.  - Awaiting MRCP today. - No evidence of active GI bleed. She has allergy to Nexium and Prilosec in the past but was able to tolerate Protonix. Recommend starting Protonix if any evidence of GI bleed or if she has ongoing abdominal pain. - Advance diet as tolerated to low-fat diet - start  gentle IV hydration - GI will follow   Otis Brace MD, [place] 02/17/2017, 1:28 PM  Pager [phone]  If no answer or after 5 PM call [phone]

## 2017-02-17 NOTE — Progress Notes (Signed)
ANTICOAGULATION CONSULT NOTE - Initial Consult  Pharmacy Consult for warfarin  Indication: atrial fibrillation  Allergies  Allergen Reactions  . Esomeprazole Sodium Swelling  . Penicillins Anaphylaxis and Rash    Has patient had a PCN reaction causing immediate rash, facial/tongue/throat swelling, SOB or lightheadedness with hypotension: Yes Has patient had a PCN reaction causing severe rash involving mucus membranes or skin necrosis: No Has patient had a PCN reaction that required hospitalization: No Has patient had a PCN reaction occurring within the last 10 years: No If all of the above answers are "NO", then may proceed with Cephalosporin use.  . Clindamycin/Lincomycin Hives       . Hydrocodone Other (See Comments)    Makes her crazy  . Nsaids Hives and Other (See Comments)    kidney failure  . Valsartan Swelling  . Prilosec [Omeprazole] Swelling and Rash    Patient Measurements: Height: 5\' 4"  (162.6 cm) Weight: 204 lb (92.5 kg) IBW/kg (Calculated) : 54.7  Assessment: 83 yo female on Coumadin 4mg  daily exc for 2mg  on Sat PTA for Afib. Held for MRCP and now to continue on 7/24. INR today up a little to 2.06. Hgb 10.8, plts wnl.  Goal of Therapy:  INR 2-3 Monitor platelets by anticoagulation protocol: Yes   Plan:  Give Coumadin 4mg  PO x 1 tonight Monitor daily INR, CBC, s/s of bleed  Elenor Quinones, PharmD, [facility] Clinical Pharmacist Pager [phone] 02/17/2017 8:57 AM

## 2017-02-17 NOTE — Progress Notes (Signed)
Internal Medicine Attending:   I saw and examined the patient. I reviewed the resident's note and I agree with the resident's findings and plan as documented in the resident's note.  Patient feels better this morning still complains of persistent abdominal pain and nausea. It is unsure if her nausea is associated with Dilaudid but states that she is on Percocet at home and has no issues with this. Patient was initially admitted for persistent abdominal pain and nausea with poor by mouth intake and found to have acute pancreatitis with some mild transaminitis possibly secondary to a passed gallstone. Patient scheduled for an MRCP today. We will transition patient off IV Dilaudid to her home Percocet and see if this will help with her nausea. Patient also has not been taking Zofran for her nausea. Encouraged patient to ask for this and she remains nauseous. Patient also presented with an acute kidney injury likely secondary to dehydration and has improved her creatinine to 1.3 today which is her baseline. We will continue to monitor for now.

## 2017-02-17 NOTE — Progress Notes (Signed)
   Subjective: Daisy Pearson is doing better this AM. Still having abdominal pain and nausea. Unsure if her nausea is associated with the Dilaudid. She is on Percocet 5/325 at home. Discussed switching her pain medications from Dilaudid to home dose Percocet and scheduling her Zofran. Discussed the plan for MRCP today. Agreeable with the plan. No questions or concerns.   Objective: Vital signs in last 24 hours: Vitals:   02/16/17 1241 02/16/17 2219 02/17/17 0459 02/17/17 0529  BP: (!) 147/60 (!) 159/56 (!) 175/64 (!) 160/50  Pulse: 62 62 67 65  Resp: 18 18    Temp: 98.6 F (37 C) 98.6 F (37 C) 98.3 F (36.8 C)   TempSrc: Oral Oral Oral   SpO2: 100% 99% 100%   Weight:      Height:       Physical Exam  Constitutional: She is oriented to person, place, and time and well-developed, well-nourished, and in no distress.  HENT:  Head: Normocephalic and atraumatic.  Eyes: Pupils are equal, round, and reactive to light. Conjunctivae are normal.  Cardiovascular: Normal rate, regular rhythm, normal heart sounds and intact distal pulses.   Pulmonary/Chest: Effort normal and breath sounds normal. She has no wheezes. She has no rales.  Abdominal: Soft. Bowel sounds are normal. There is tenderness (Epigastric). There is no rebound.  Musculoskeletal: She exhibits no edema.  Neurological: She is alert and oriented to person, place, and time.   Assessment/Plan: Active Problems:   Acute pancreatitis   Type 2 diabetes mellitus ([place])   Acute kidney injury ([place])   Essential hypertension   A-fib ([place])  1. Acute Pancreatitis  - Epigastric pain improving, able to eat minimally - Normal triglycerides and denies EtOH use  - Hepatitis panel negative  - GI consulted, plan for a MRCP today at 2PM - NPO until after MRCP then will try to advance her diet   2. AKI on CKD - Cr baseline unknown, prior records in 2016 showed Cr of 1.34  - Cr trend: 2.26 ->2.06 -> 1.52 -> 1.35   3. Diabetes Mellitus    - Last A1c 7.7 - On insulin regimen at home  - Will place on sliding scale - Appreciate Diabetes Coordinator's recommendations of adding 3 units TID, will add once she is able to tolerate diet better   4. HTN  - Will hold Enalapril due to AKI, will resume as outpatient   5. Afib  - Rhythm controlled with Amiodarone  - On warfarin at home, will hold for now per pharmacy due to INR of >2  Dispo: Anticipated discharge in approximately 1-2 day(s).   Ina Homes, MD 02/17/2017, 10:56 AM My Pager: [phone]

## 2017-02-17 NOTE — Progress Notes (Signed)
PT Cancellation Note  Patient Details Name: CLEORA KARNIK MRN: [id number] DOB: [birth year]   Cancelled Treatment:    Reason Eval/Treat Not Completed: Patient at procedure or test/unavailable PT will continue to follow acutely.    [place] April, PTA Pager: [phone]   02/17/2017, 4:11 PM

## 2017-02-17 NOTE — Progress Notes (Signed)
OT Cancellation Note  Patient Details Name: Daisy Pearson MRN: [id number] DOB: 09-08-1933   Cancelled Treatment:    Reason Eval/Treat Not Completed: Pain limiting ability to participate;Patient declined, no reason specified. Pt c/o pain in abdomen despite premedication; too severe to perform OOB activity and declining participation in OT at this time. Will follow up as time allows.  Binnie Kand M.S., OTR/L Pager: [phone]  02/17/2017, 11:13 AM

## 2017-02-18 LAB — GLUCOSE, CAPILLARY
Glucose-Capillary: 212 mg/dL — ABNORMAL HIGH (ref 65–99)
Glucose-Capillary: 214 mg/dL — ABNORMAL HIGH (ref 65–99)
Glucose-Capillary: 174 mg/dL — ABNORMAL HIGH (ref 65–99)
Glucose-Capillary: 167 mg/dL — ABNORMAL HIGH (ref 65–99)
Glucose-Capillary: 218 mg/dL — ABNORMAL HIGH (ref 65–99)

## 2017-02-18 LAB — CBC
HCT: 32.6 % — ABNORMAL LOW (ref 36.0–46.0)
Hemoglobin: 10.4 g/dL — ABNORMAL LOW (ref 12.0–15.0)
MCH: 30.9 pg (ref 26.0–34.0)
MCHC: 31.9 g/dL (ref 30.0–36.0)
MCV: 96.7 fL (ref 78.0–100.0)
PLATELETS: 199 10*3/uL (ref 150–400)
RBC: 3.37 MIL/uL — ABNORMAL LOW (ref 3.87–5.11)
RDW: 13.1 % (ref 11.5–15.5)
WBC: 12.1 10*3/uL — ABNORMAL HIGH (ref 4.0–10.5)

## 2017-02-18 LAB — BASIC METABOLIC PANEL
Anion gap: 9 (ref 5–15)
BUN: 15 mg/dL (ref 6–20)
CO2: 21 mmol/L — ABNORMAL LOW (ref 22–32)
CREATININE: 1.25 mg/dL — AB (ref 0.44–1.00)
Calcium: 8.2 mg/dL — ABNORMAL LOW (ref 8.9–10.3)
Chloride: 109 mmol/L (ref 101–111)
GFR calc non Af Amer: 39 mL/min — ABNORMAL LOW (ref >60)
GFR, EST AFRICAN AMERICAN: 45 mL/min — AB (ref >60)
Glucose, Bld: 169 mg/dL — ABNORMAL HIGH (ref 65–99)
Potassium: 4.2 mmol/L (ref 3.5–5.1)
SODIUM: 139 mmol/L (ref 135–145)

## 2017-02-18 LAB — HEPATIC FUNCTION PANEL
ALK PHOS: 98 U/L (ref 38–126)
ALT: 77 U/L — AB (ref 14–54)
AST: 25 U/L (ref 15–41)
Albumin: 2.7 g/dL — ABNORMAL LOW (ref 3.5–5.0)
BILIRUBIN DIRECT: 0.2 mg/dL (ref 0.1–0.5)
BILIRUBIN INDIRECT: 0.9 mg/dL (ref 0.3–0.9)
TOTAL PROTEIN: 5.9 g/dL — AB (ref 6.5–8.1)
Total Bilirubin: 1.1 mg/dL (ref 0.3–1.2)

## 2017-02-18 LAB — PROTIME-INR
INR: 2.66
INR: 2.68
PROTHROMBIN TIME: 28.8 s — AB (ref 11.4–15.2)
Prothrombin Time: 29 seconds — ABNORMAL HIGH (ref 11.4–15.2)

## 2017-02-18 LAB — LIPASE, BLOOD: Lipase: 167 U/L — ABNORMAL HIGH (ref 11–51)

## 2017-02-18 MED ORDER — OXYCODONE-ACETAMINOPHEN 5-325 MG PO TABS
1.0000 | ORAL_TABLET | Freq: Four times a day (QID) | ORAL | Status: DC | PRN
  Administered 2017-02-18 – 2017-02-22 (×14): 1 via ORAL
  Filled 2017-02-18 (×15): qty 1

## 2017-02-18 MED ORDER — PHYTONADIONE 5 MG PO TABS
5.0000 mg | ORAL_TABLET | Freq: Once | ORAL | Status: DC
Start: 2017-02-18 — End: 2017-02-18

## 2017-02-18 MED ORDER — WARFARIN SODIUM 1 MG PO TABS: 1.0000 mg | ORAL_TABLET | Freq: Once | ORAL | Status: DC

## 2017-02-18 MED ORDER — PROMETHAZINE HCL 25 MG PO TABS
12.5000 mg | ORAL_TABLET | Freq: Four times a day (QID) | ORAL | Status: DC | PRN
  Administered 2017-02-18 – 2017-02-20 (×4): 12.5 mg via ORAL
  Filled 2017-02-18 (×4): qty 1

## 2017-02-18 MED ORDER — PHYTONADIONE 5 MG PO TABS
2.5000 mg | ORAL_TABLET | Freq: Once | ORAL | Status: AC
  Administered 2017-02-18: 2.5 mg via ORAL
  Filled 2017-02-18: qty 1

## 2017-02-18 MED ORDER — LEVOFLOXACIN IN D5W 500 MG/100ML IV SOLN: 500.0000 mg | Freq: Once | INTRAVENOUS | Status: DC

## 2017-02-18 MED ORDER — LEVOFLOXACIN IN D5W 500 MG/100ML IV SOLN
500.0000 mg | Freq: Once | INTRAVENOUS | Status: AC
Start: 2017-02-19 — End: 2017-02-19
  Administered 2017-02-19: 500 mg via INTRAVENOUS
  Filled 2017-02-18: qty 100

## 2017-02-18 NOTE — Progress Notes (Addendum)
Internal Medicine Attending:   I saw and examined the patient. I reviewed the resident's note and I agree with the resident's findings and plan as documented in the resident's note.  Patient continues to complain of persistent abdominal pain and poor by mouth intake. Patient was initially admitted for abdominal pain, nausea, vomiting secondary to likely gallstone pancreatitis. MRCP done yesterday shows choledocholithiasis and pancreatitis. GI follow-up and recommendations appreciated. Patient is scheduled for an ERCP in the morning and may eventually need a cholecystectomy. INR will need to be reversed. We will give vitamin K 5 mg by mouth today. We will continue with pain control and IV fluids for now. No further workup at this time.

## 2017-02-18 NOTE — Progress Notes (Signed)
Physical Therapy Treatment Patient Details Name: Daisy Pearson MRN: [id number] DOB: [birth year] Today's Date: 02/18/2017    History of Present Illness Daisy Pearson is a 83 y.o. female with past medical history of paroxysmal atrial fibrillation currently on Coumadin, diabetes, chronic kidney disease, history of coronary artery disease, history of pacemaker placement admitted to the hospital with the abdominal pain nausea and vomiting. She was found to have elevated lipase of 798. Normal ultrasound without any cholelithiasis.   W/u for acute pancreatitis.      PT Comments    Pt mobilizing well today, however fatigues quickly. Plan to use RW in next session to increase ambulation distance and perform stair training of pt tolerance allows. Pt should benefit from continued skilled PT to increase activity tolerance and functional independence. Will continue to follow acutely.   Follow Up Recommendations  Home health PT;Supervision/Assistance - 24 hour     Equipment Recommendations  None recommended by PT    Recommendations for Other Services       Precautions / Restrictions Precautions Precautions: None Restrictions Weight Bearing Restrictions: No    Mobility  Bed Mobility               General bed mobility comments: in chair on arrival  Transfers Overall transfer level: Needs assistance Equipment used: None Transfers: Sit to/from Stand Sit to Stand: Min guard         General transfer comment: Pt able to rise into standing from recliner without use of armrests. Min guard for safety.  Ambulation/Gait Ambulation/Gait assistance: Min assist Ambulation Distance (Feet): 140 Feet Assistive device: 1 person hand held assist;None Gait Pattern/deviations: Step-through pattern     General Gait Details: Pt reports she does not use AD at home. Pt with overall steady gait and no AD to begin. Pt quickly became fatigued requiring 1 person HHA and hurried back to room.  Pt OOB after ambulation, SpO2 100%. Consider using RW to increase ambulation distance.   Stairs            Wheelchair Mobility    Modified Rankin (Stroke Patients Only)       Balance Overall balance assessment: Needs assistance Sitting-balance support: Feet supported;No upper extremity supported Sitting balance-Leahy Scale: Fair     Standing balance support: Bilateral upper extremity supported;During functional activity Standing balance-Leahy Scale: Fair Standing balance comment: transfers and gait w/o AD                            Cognition Arousal/Alertness: Awake/alert Behavior During Therapy: WFL for tasks assessed/performed Overall Cognitive Status: Within Functional Limits for tasks assessed                                        Exercises      General Comments        Pertinent Vitals/Pain Pain Assessment: 0-10 Pain Score: 8  Pain Location: RUQ of abdomen.  Pain Descriptors / Indicators: Aching;Grimacing;Guarding;Sharp Pain Intervention(s): Monitored during session;Limited activity within patient's tolerance    Home Living                      Prior Function            PT Goals (current goals can now be found in the care plan section) Acute Rehab PT Goals Patient Stated Goal: To return  to home.  PT Goal Formulation: With patient Time For Goal Achievement: 03/01/17 Potential to Achieve Goals: Good Progress towards PT goals: Progressing toward goals    Frequency    Min 3X/week      PT Plan Current plan remains appropriate    Co-evaluation              AM-PAC PT "6 Clicks" Daily Activity  Outcome Measure  Difficulty turning over in bed (including adjusting bedclothes, sheets and blankets)?: Total Difficulty moving from lying on back to sitting on the side of the bed? : Total Difficulty sitting down on and standing up from a chair with arms (e.g., wheelchair, bedside commode, etc,.)?: A  Little Help needed moving to and from a bed to chair (including a wheelchair)?: A Little Help needed walking in hospital room?: A Little Help needed climbing 3-5 steps with a railing? : A Little 6 Click Score: 14    End of Session Equipment Utilized During Treatment: Gait belt Activity Tolerance: Patient limited by pain Patient left: in chair;with call bell/phone within reach;with family/visitor present Nurse Communication: Mobility status;Patient requests pain meds PT Visit Diagnosis: Pain;Muscle weakness (generalized) (M62.81);Dizziness and giddiness (R42) Pain - Right/Left: Right Pain - part of body:  (RUQ of abdomen.)     Time: 1046-1100 PT Time Calculation (min) (ACUTE ONLY): 14 min  Charges:  $Gait Training: 8-22 mins                    G Codes:       Benjiman Core, [place] Pager [phone] Acute Rehab   Allena Katz 02/18/2017, 11:10 AM

## 2017-02-18 NOTE — Progress Notes (Addendum)
[facility] Gastroenterology Progress Note  Daisy Pearson 83 y.o. 1934/03/23  CC:  Pancreatitis   Subjective: Patient feeling somewhat better. Continues to have generalized abdominal pain. Denied further nausea or vomiting to me. Denied dysphagia.  ROS : Positive for weakness. Negative for chest pain shortness of breath   Objective: Vital signs in last 24 hours: Vitals:   02/17/17 2241 02/18/17 0523  BP: (!) 147/61 (!) 164/71  Pulse: 65 69  Resp: 16 16  Temp: 98.3 F (36.8 C) 98.4 F (36.9 C)    Physical Exam:  Constitutional: She is oriented to person, place, and time. She appears well-developed and well-nourished. No distress.  HENT:  Head: Normocephalic and atraumatic.  Eyes: EOM are normal. No scleral icterus.  Neck: Normal range of motion. Neck supple. No thyromegaly present.  Cardiovascular: Normal rate, regular rhythm and normal heart sounds.   Pulmonary/Chest: Effort normal and breath sounds normal. No respiratory distress.  Anterior exam only  Abdominal: Soft. Bowel sounds are normal. She exhibits no distension.  There is no rebound and no guarding.  Right upper quadrant and epigastric tenderness to palpation  Musculoskeletal: Normal range of motion. She exhibits no edema.  Neurological: She is alert and oriented to person, place, and time.  Psychiatric: She has a normal mood and affect. Her behavior is normal. Thought content normal.  Vitals reviewed.  Lab Results:  Recent Labs  02/17/17 0432 02/18/17 0450  NA 139 139  K 5.0 4.2  CL 109 109  CO2 25 21*  GLUCOSE 211* 169*  BUN 22* 15  CREATININE 1.35* 1.25*  CALCIUM 8.5* 8.2*    Recent Labs  02/17/17 0432 02/18/17 0450  AST 42* 25  ALT 120* 77*  ALKPHOS 98 98  BILITOT 0.8 1.1  PROT 5.6* 5.9*  ALBUMIN 2.8* 2.7*    Recent Labs  02/16/17 0643 02/18/17 0450  WBC 13.1* 12.1*  HGB 10.8* 10.4*  HCT 33.8* 32.6*  MCV 98.3 96.7  PLT 153 199    Recent Labs  02/17/17 0432 02/18/17 0450   LABPROT 23.5* 28.8*  INR 2.06 2.66      Assessment/Plan: - Acute pancreatitis. MRCP showing pancreatitis with choledocholithiasis - Elevated LFTs. Could be from passed gallstone.Improving - Anemia. No globin stable in the hospital. -  atrial fibrillation.  Coumadin  Currently on hold. INR 2.66 today - History of pacemaker placement in 2014.  Recommendations ------------------------ - MRCP showed acute pancreatitis as well as choledocholithiasis . CBD of 10 mm. - Plan for ERCP tomorrow for stone extraction. Risks benefits alternatives discussed with the patient and patient's daughter verbalized understanding. Complications such as infection, bleeding, perforation and post-ERCP pancreatitis also discussed. - INR needs to be less than 1.8 ( preferably less than 1.5) prior to procedure. - Discussed with admitting team to reverse INR with either vitamin K or FFP. - She may need a cholecystectomy eventually. - Keep nothing by mouth past midnight.   Otis Brace MD, [place] 02/18/2017, 1:59 PM  Pager [phone]  If no answer or after 5 PM call [phone]

## 2017-02-18 NOTE — Progress Notes (Addendum)
   Subjective: Mrs. Pember continue to have significant abdominal pain over the interval. She went for an MRCP yesterday but the exam had to be stopped as she was in pain and "couldn't breath." Discussed that we would like her to continue to eat what she can, and we will continue to work on pain control and nausea. Discussed that famotidine was add yesterday. She is agreeable with the plan and has no questions or concerns.   Objective: Vital signs in last 24 hours: Vitals:   02/17/17 1344 02/17/17 1637 02/17/17 2241 02/18/17 0523  BP: (!) 155/94 (!) 160/53 (!) 147/61 (!) 164/71  Pulse: 64 65 65 69  Resp:   16 16  Temp: 98.5 F (36.9 C) 97.6 F (36.4 C) 98.3 F (36.8 C) 98.4 F (36.9 C)  TempSrc: Oral Other (Comment) Oral Oral  SpO2: 98% 100% 99% 97%  Weight:      Height:       Physical Exam  Constitutional: She is oriented to person, place, and time. She appears well-developed and well-nourished.  HENT:  Head: Normocephalic and atraumatic.  Eyes: Pupils are equal, round, and reactive to light. Conjunctivae are normal.  Cardiovascular: Normal rate, regular rhythm, normal heart sounds and intact distal pulses.  Exam reveals no friction rub.   No murmur heard. Pulmonary/Chest: Effort normal and breath sounds normal. She has no wheezes. She has no rales.  Abdominal: Soft. Bowel sounds are normal. There is tenderness (Epigastric tenderness significantly improved from days prior). There is no rebound and no guarding.  Musculoskeletal: She exhibits no edema.  Neurological: She is alert and oriented to person, place, and time.   Assessment/Plan: Active Problems:   Acute pancreatitis   Type 2 diabetes mellitus ([place])   Acute kidney injury ([place])   Essential hypertension   A-fib ([place])  1. Acute Pancreatitis  - Epigastric pain improving, able to eat minimally - Normal triglycerides and denies EtOH use  - Hepatitis panel negative - MRCP done mild diffuse ductal dilation due to  choledocholithiasis  - GI consulted; appreciate the recommendations  - Advance diet to soft today - Continue to monitor leukocytosis and abdominal pain - Started on Famotidine on 02/17/2017 - switched to phenergan 12.5 mg from zofran due to QT prolongation    2. AKI on CKD - Cr baseline unknown, prior records in 2016 showed Cr of 1.34  - Cr trend: 2.26 ->2.06 -> 1.52 -> 1.35 -> 1.25  3. Diabetes Mellitus  - Last A1c 7.7 - Continue sliding scale  - Appreciate Diabetes Coordinator's recommendations of adding 3 units TID, will add once she is able to tolerate diet better   4. HTN  - Will hold Enalapril due to AKI, will resume as outpatient   5. Afib  - Rhythm controlled with Amiodarone  - On warfarin at home, will hold for now per pharmacy due to INR of >2  Dispo: Anticipated discharge in approximately 1-2 day(s).   Ina Homes, MD 02/18/2017, 7:46 AM  My Pager: [phone]

## 2017-02-18 NOTE — Discharge Summary (Signed)
Name: Daisy Pearson MRN: [id number] DOB: 1934/06/08 83 y.o. PCP: Daisy Pearson., MD  Date of Admission: 02/14/2017  4:34 PM Date of Discharge: 02/23/2017 Attending Physician: Daisy Falcon, MD  Discharge Diagnosis: 1. Acute Pancreatitis  2. Type 2 Diabetes Mellitus  3. Essential Hypertension  4. A-fib  5. Acute on Chronic Kidney Disease  Principal Problem:   Acute gallstone pancreatitis Active Problems:   Type 2 diabetes mellitus (Daisy Pearson)   Acute kidney injury (Daisy Pearson)   Essential hypertension   A-fib (Daisy Pearson)  Discharge Medications: Allergies as of 02/23/2017      Reactions   Esomeprazole Sodium Swelling   Penicillins Anaphylaxis, Rash   Has patient had a PCN reaction causing immediate rash, facial/tongue/throat swelling, SOB or lightheadedness with hypotension: Yes Has patient had a PCN reaction causing severe rash involving mucus membranes or skin necrosis: No Has patient had a PCN reaction that required hospitalization: No Has patient had a PCN reaction occurring within the last 10 years: No If all of the above answers are "NO", then may proceed with Cephalosporin use.   Clindamycin/lincomycin Hives      Hydrocodone Other (See Comments)   Makes her crazy   Nsaids Hives, Other (See Comments)   kidney failure   Valsartan Swelling   Prilosec [omeprazole] Swelling, Rash      Medication List    TAKE these medications   acetaminophen 325 MG tablet Commonly known as:  TYLENOL Take 650 mg by mouth every 6 (six) hours as needed for headache (pain).   alendronate 70 MG tablet Commonly known as:  FOSAMAX Take 70 mg by mouth every Saturday. Take with a full glass of water on an empty stomach.   amiodarone 200 MG tablet Commonly known as:  PACERONE Take 200 mg by mouth daily.   amLODipine 5 MG tablet Commonly known as:  NORVASC Take 1 tablet (5 mg total) by mouth daily.   APIDRA SOLOSTAR 100 UNIT/ML Solostar Pen Generic drug:  Insulin Glulisine Inject 12 Units into  the skin See admin instructions. Inject 12 units subcutaneously three times daily before meals - plus adjustment if CBG >200 - 1 unit for every 20   atorvastatin 40 MG tablet Commonly known as:  LIPITOR Take 40 mg by mouth daily.   calcitRIOL 0.5 MCG capsule Commonly known as:  ROCALTROL Take 0.5 mcg by mouth daily.   CEREFOLIN NAC 6-2-600 MG Tabs Take 1 tablet by mouth daily.   cetirizine 10 MG tablet Commonly known as:  ZYRTEC Take 10 mg by mouth daily as needed for allergies.   enalapril 10 MG tablet Commonly known as:  VASOTEC Take 1 tablet (10 mg total) by mouth daily. What changed:  medication strength  how much to take   enoxaparin 150 MG/ML injection Commonly known as:  LOVENOX Inject 0.93 mLs (140 mg total) into the skin daily.   EPIPEN 2-PAK 0.3 mg/0.3 mL Soaj injection Generic drug:  EPINEPHrine Inject 0.3 mg into the muscle once as needed (severe allergic reaction).   IMPACT ADVANCED RECOVERY Liqd Take 1 Container by mouth 2 (two) times daily.   JANTOVEN 4 MG tablet Generic drug:  warfarin Take 2-4 mg by mouth See admin instructions. Take 1/2 tablet (2 mg) by mouth on Saturday at bedtime, take 1 tablet (4 mg) on all other nights   LEVEMIR FLEXTOUCH 100 UNIT/ML Pen Generic drug:  Insulin Detemir Inject 18 Units into the skin 2 (two) times daily.   metaxalone 800 MG tablet Commonly known  asJonah Pearson Take 800 mg by mouth 3 (three) times daily as needed for muscle spasms.   metoprolol tartrate 25 MG tablet Commonly known as:  LOPRESSOR Take 25 mg by mouth 2 (two) times daily.   mirtazapine 15 MG tablet Commonly known as:  REMERON Take 15 mg by mouth at bedtime.   nitroGLYCERIN 0.4 MG SL tablet Commonly known as:  NITROSTAT Place 0.4 mg under the tongue every 5 (five) minutes as needed for chest pain.   OMEGA 3 PO Take 1 capsule by mouth daily.   oxyCODONE-acetaminophen 5-325 MG tablet Commonly known as:  PERCOCET/ROXICET Take 1 tablet by  mouth 2 (two) times daily as needed for severe pain.   polyethylene glycol packet Commonly known as:  MIRALAX / GLYCOLAX Take 17 g by mouth daily.   QUEtiapine 25 MG tablet Commonly known as:  SEROQUEL Take 25 mg by mouth at bedtime.   senna-docusate 8.6-50 MG tablet Commonly known as:  Senokot-S Take 2 tablets by mouth 2 (two) times daily.   Vitamin D 2000 units tablet Take 2,000 Units by mouth daily.      Disposition and follow-up:   [name] was discharged from Daisy Pearson in Stable condition.  At the hospital follow up visit please address:  1.   Evaluate abdominal pain.  Check INR, once at 2.0 stop lovenox bridge.  Please follow-up on her BP medications. She may require escalation.   2.  Labs / imaging needed at time of follow-up: INR  3.  Pending labs/ test needing follow-up: N/A  Follow-up Appointments: Follow-up Information    [facility] Follow [facility].   Why:  A representative from [facility] will contact you to arrange start date and time for your therapy. Contact information: [place] [address] [phone]        Surgery, Daisy Pearson Follow up on 03/09/2017.   Specialty:  General Surgery Why:  Your appointment is at 9:00AM, be at the office 30 minutes early for check in.  Bring photo ID and insurance information.   Contact information: [address] [place] [address] [phone]        Daisy Pearson., MD. Go in 3 day(s).   Specialty:  Internal Medicine Contact information: [address] [place] [address] [place] [facility] Course by problem list: Principal Problem:   Acute gallstone pancreatitis Active Problems:   Type 2 diabetes mellitus (Daisy Pearson)   Acute kidney injury (Daisy Pearson)   Essential hypertension   A-fib (Daisy Pearson)   1. Acute Gallstone Pancreatitis. Daisy Pearson is a 83 y.o. Female with a PMHx significant for  paroxysmal A-fb, insulin dependent type 2 DM, CKD, CAD s/p CABG, and HTN who presented with acute epigastric pain radiating to the back associated with N/V. In the ED her labs were remarkable for a Lipase of 798, transaminitis (ALT 453, AST 508), leukocytosis, and a creatinine of 2.26. The diagnosis of acute pancreatitis was made. GI was consulted. Lipid panel and alcohol history were both unremarkable. MRCP was initially postponed due to her AKI; however, it later revealed a mild diffuse ductal dilatation due to choledocholithiasis. She was subsequently taken for a laparoscopic cholecystectomy on 7/30. Her LFTs trended back to normal values over the course of the hospitalization. Her abdominal pain and nausea were difficult to control at first but on discharge were adequately controled with her home  pain medications. Diet was gradually advanced and she had adequate oral intake on discharge.    2. Type 2 Diabetes Mellitus. Daisy Pearson's diabetes regimen was resumed on discharge.   3. Essential Hypertension. Initially Daisy Pearson's Enalapril was held due to her AKI, however, with improvement in her AKI her Enalapril was resumed. Her BP continued to be elevated, therefore, her enalapril was increased to 10 mg QD, and amlodipine 5 mg QD was added. Her metoprolol 25mg  BID was continued.   4. A-fib. Daisy Pearson is on Warfarin anticoagulation therapy for her rate and rhythm controlled A-Fib. Prior to ERCP her warfarin was held and continued to be held until after her laparoscopic cholecystectomy. Per her family's request she was discharged on a lovenox bridge with instruction to follow-up with her PCP for INR checks. Once her INR reaches 2.0 she can stop the lovenox and continue the warfarin.    5. Acute on Chronic Kidney Disease. On admission Daisy Pearson's creatinine was elevated to 2.26 due to decreased PO intake. With IVF her creatinine returned to her baseline of 1.30. She was then able to go for  MRCP and subsequent work-up. Her creatinine was stable on discharge.   Discharge Vitals:   BP (!) 166/72 (BP Location: Left Arm)   Pulse (!) 59   Temp 98.2 F (36.8 C) (Oral)   Resp 16   Ht 5' 4.02" (1.626 m)   Wt 204 lb (92.5 kg)   SpO2 97%   BMI 35.00 kg/m   Pertinent Labs, Studies, and Procedures:  Chest X-ray (7/22) IMPRESSION: No active disease.  Previous CABG.  Pacemaker.  US Abdominal RUQ (7/22) IMPRESSION: No acute findings.  No evidence for cholelithiasis.  MRCP (7/26) IMPRESSION: Mild diffuse biliary ductal dilatation due to choledocholithiasis.  Diffuse gallbladder wall thickening is nonspecific, but likely due to acute pancreatitis.  Moderate acute pancreatitis. No evidence of peripancreatic fluid collections.  Posterior right lower lobe atelectasis versus infiltrate.  ERCP (7/27) IMPRESSION: 1. Endoscopic CBD catheterization and intervention. These images were submitted for radiologic interpretation only. Please see the procedural report for the amount of contrast and the fluoroscopy time utilized.  Discharge Instructions: Discharge Instructions    Diet - low sodium heart healthy    Complete by:  As directed    Face-to-face encounter (required for Medicare/Medicaid patients)    Complete by:  As directed    I Ina Homes certify that this patient is under my care and that I, or a nurse practitioner or physician's assistant working with me, had a face-to-face encounter that meets the physician face-to-face encounter requirements with this patient on 02/23/2017. The encounter with the patient was in whole, or in part for the following medical condition(s) which is the primary reason for home health care (List medical condition): gait instability   The encounter with the patient was in whole, or in part, for the following medical condition, which is the primary reason for home health care:  Pancreatitis   I certify that, based on my findings, the  following services are medically necessary home health services:  Physical therapy   Reason for Medically Necessary Home Health Services:  Therapy- Personnel officer, Public librarian   My clinical findings support the need for the above services:  Unsafe ambulation due to balance issues   Further, I certify that my clinical findings support that this patient is homebound due to:  Unsafe ambulation due to balance issues   Home Health    Complete by:  As directed    To provide the following care/treatments:  PT   Increase activity slowly    Complete by:  As directed      Signed: Ina Homes, MD 02/23/2017, 1:11 PM   Pager: My Pager: [phone]

## 2017-02-18 NOTE — Progress Notes (Signed)
Occupational Therapy Treatment and Discharge  Patient Details Name: Daisy Pearson MRN: [id number] DOB: [birth year] Today's Date: 02/18/2017    History of present illness Daisy Pearson is a 83 y.o. female with past medical history of paroxysmal atrial fibrillation currently on Coumadin, diabetes, chronic kidney disease, history of coronary artery disease, history of pacemaker placement admitted to the hospital with the abdominal pain nausea and vomiting. She was found to have elevated lipase of 798. Normal ultrasound without any cholelithiasis.   W/u for acute pancreatitis.     OT comments  Pt able to perform toilet transfers, LB dressing, and functional mobility in room with min guard assist. Pt has tub bench at home and is familiar with use for tub transfer; no questions or concerns. All OT goals met adequately for d/c from acute OT. No further acute OT needs identified; signing off at this time. Please re-consult if needs change.    Follow Up Recommendations  No OT follow up    Equipment Recommendations  None recommended by OT    Recommendations for Other Services      Precautions / Restrictions Precautions Precautions: None Restrictions Weight Bearing Restrictions: No       Mobility Bed Mobility               General bed mobility comments: Pt OOB in chair upon arrival  Transfers Overall transfer level: Needs assistance Equipment used: None Transfers: Sit to/from Stand Sit to Stand: Min guard         General transfer comment: Min guard for safety, pt cautions with transfers but no LOB.    Balance Overall balance assessment: Needs assistance Sitting-balance support: Feet supported;No upper extremity supported Sitting balance-Leahy Scale: Fair     Standing balance support: No upper extremity supported;During functional activity Standing balance-Leahy Scale: Fair Standing balance comment: transfers and gait w/o AD                            ADL either performed or assessed with clinical judgement   ADL Overall ADL's : Needs assistance/impaired                     Lower Body Dressing: Min guard;Sit to/from stand   Toilet Transfer: Min guard;Ambulation;Comfort height toilet   Toileting- Clothing Manipulation and Hygiene: Min guard;Sit to/from Nurse, children's Details (indicate cue type and reason): Pt reports she uses tub bench at home for tub transfers; familiar with technique without questions or concerns Functional mobility during ADLs: Min guard       Vision       Perception     Praxis      Cognition Arousal/Alertness: Awake/alert Behavior During Therapy: WFL for tasks assessed/performed Overall Cognitive Status: Within Functional Limits for tasks assessed                                          Exercises     Shoulder Instructions       General Comments      Pertinent Vitals/ Pain       Pain Assessment: 0-10 Pain Score: 6  Pain Location: RUQ of abdomen Pain Descriptors / Indicators: Aching Pain Intervention(s): Monitored during session  Home Living  Prior Functioning/Environment              Frequency           Progress Toward Goals  OT Goals(current goals can now be found in the care plan section)  Progress towards OT goals: Goals met/education completed, patient discharged from OT  Acute Rehab OT Goals Patient Stated Goal: To return to home.  OT Goal Formulation: All assessment and education complete, DC therapy  Plan All goals met and education completed, patient discharged from OT services;Discharge plan remains appropriate    Co-evaluation                 AM-PAC PT "6 Clicks" Daily Activity     Outcome Measure   Help from another person eating meals?: None Help from another person taking care of personal grooming?: None Help from another person toileting,  which includes using toliet, bedpan, or urinal?: A Little Help from another person bathing (including washing, rinsing, drying)?: A Little Help from another person to put on and taking off regular upper body clothing?: None Help from another person to put on and taking off regular lower body clothing?: A Little 6 Click Score: 21    End of Session        Activity Tolerance Patient tolerated treatment well   Patient Left in chair;with call bell/phone within reach;with family/visitor present   Nurse Communication          Time: 1210-1222 OT Time Calculation (min): 12 min  Charges: OT General Charges $OT Visit: 1 Procedure OT Treatments $Self Care/Home Management : 8-22 mins  Daisy Pearson A. Ulice Brilliant, M.S., OTR/L Pager: [place] 02/18/2017, 1:51 PM

## 2017-02-18 NOTE — Progress Notes (Signed)
ANTICOAGULATION CONSULT NOTE - Follow-up Consult  Pharmacy Consult for warfarin  Indication: atrial fibrillation  Allergies  Allergen Reactions  . Esomeprazole Sodium Swelling  . Penicillins Anaphylaxis and Rash    Has patient had a PCN reaction causing immediate rash, facial/tongue/throat swelling, SOB or lightheadedness with hypotension: Yes Has patient had a PCN reaction causing severe rash involving mucus membranes or skin necrosis: No Has patient had a PCN reaction that required hospitalization: No Has patient had a PCN reaction occurring within the last 10 years: No If all of the above answers are "NO", then may proceed with Cephalosporin use.  . Clindamycin/Lincomycin Hives       . Hydrocodone Other (See Comments)    Makes her crazy  . Nsaids Hives and Other (See Comments)    kidney failure  . Valsartan Swelling  . Prilosec [Omeprazole] Swelling and Rash    Patient Measurements: Height: 5\' 4"  (162.6 cm) Weight: 204 lb (92.5 kg) IBW/kg (Calculated) : 54.7  Assessment: 83 yo female on chronic Coumadin for atrial fibrillation. Her home Coumadin regimen was 4mg  daily exc for 2mg  on Saturday. Coumadin was held for MRCP, but resumed 7/24.   INR trending up quickly -2.66 today (rise of 0.6 in 24hrs- significant increase) after 2 days of 4mg  once daily. Will reduce dose today due to this sharp rise. No significant interacting medications noted on MAR. History of transaminitis this admission now trending down. Hepatitis panel was negative. Tbili is within normal limits. Patient is on full liquid diet at this time. This acute liver insult and/or dietary changes may be impacting INR.   Goal of Therapy:  INR 2-3 Monitor platelets by anticoagulation protocol: Yes   Plan:  Reduce Coumadin to 1mg  PO x 1 tonight (due to significant increase in INR) Monitor daily INR, CBC, s/s of bleed Note prolonged Qtc on scheduled Zofran -- please consider repeat EKG and/or changing agents.    Sloan Leiter, PharmD, BCPS Clinical Pharmacist Clinical Phone 02/18/2017 until 3:30 PM - [phone] After hours, please call #[phone] 02/18/2017 9:16 AM

## 2017-02-19 ENCOUNTER — Encounter (HOSPITAL_COMMUNITY): Payer: Self-pay | Admitting: Certified Registered"

## 2017-02-19 ENCOUNTER — Inpatient Hospital Stay (HOSPITAL_COMMUNITY): Payer: Medicare Other

## 2017-02-19 ENCOUNTER — Inpatient Hospital Stay (HOSPITAL_COMMUNITY): Payer: Medicare Other | Admitting: Certified Registered"

## 2017-02-19 ENCOUNTER — Encounter (HOSPITAL_COMMUNITY)
Admission: EM | Disposition: A | Discharge status: Home / Self Care | Payer: Self-pay | Source: Home / Self Care | Attending: Internal Medicine

## 2017-02-19 DIAGNOSIS — K8050 Calculus of bile duct without cholangitis or cholecystitis without obstruction: Secondary | ICD-10-CM

## 2017-02-19 DIAGNOSIS — Z794 Long term (current) use of insulin: Secondary | ICD-10-CM

## 2017-02-19 DIAGNOSIS — I480 Paroxysmal atrial fibrillation: Secondary | ICD-10-CM

## 2017-02-19 DIAGNOSIS — I2510 Atherosclerotic heart disease of native coronary artery without angina pectoris: Secondary | ICD-10-CM

## 2017-02-19 DIAGNOSIS — Z01810 Encounter for preprocedural cardiovascular examination: Secondary | ICD-10-CM

## 2017-02-19 DIAGNOSIS — Z951 Presence of aortocoronary bypass graft: Secondary | ICD-10-CM

## 2017-02-19 DIAGNOSIS — Z950 Presence of cardiac pacemaker: Secondary | ICD-10-CM

## 2017-02-19 DIAGNOSIS — K851 Biliary acute pancreatitis without necrosis or infection: Principal | ICD-10-CM

## 2017-02-19 DIAGNOSIS — I10 Essential (primary) hypertension: Secondary | ICD-10-CM

## 2017-02-19 DIAGNOSIS — E119 Type 2 diabetes mellitus without complications: Secondary | ICD-10-CM

## 2017-02-19 HISTORY — PX: ERCP: SHX60

## 2017-02-19 HISTORY — PX: ERCP: SHX5425

## 2017-02-19 LAB — COMPREHENSIVE METABOLIC PANEL
ALK PHOS: 229 U/L — AB (ref 38–126)
ALT: 187 U/L — ABNORMAL HIGH (ref 14–54)
ANION GAP: 11 (ref 5–15)
AST: 212 U/L — ABNORMAL HIGH (ref 15–41)
Albumin: 2.8 g/dL — ABNORMAL LOW (ref 3.5–5.0)
BILIRUBIN TOTAL: 2.1 mg/dL — AB (ref 0.3–1.2)
BUN: 13 mg/dL (ref 6–20)
CALCIUM: 8.2 mg/dL — AB (ref 8.9–10.3)
CO2: 20 mmol/L — ABNORMAL LOW (ref 22–32)
Chloride: 106 mmol/L (ref 101–111)
Creatinine, Ser: 1.28 mg/dL — ABNORMAL HIGH (ref 0.44–1.00)
GFR calc Af Amer: 44 mL/min — ABNORMAL LOW (ref >60)
GFR calc non Af Amer: 38 mL/min — ABNORMAL LOW (ref >60)
GLUCOSE: 216 mg/dL — AB (ref 65–99)
POTASSIUM: 3.5 mmol/L (ref 3.5–5.1)
Sodium: 137 mmol/L (ref 135–145)
TOTAL PROTEIN: 6.1 g/dL — AB (ref 6.5–8.1)

## 2017-02-19 LAB — GLUCOSE, CAPILLARY
GLUCOSE-CAPILLARY: 207 mg/dL — AB (ref 65–99)
GLUCOSE-CAPILLARY: 188 mg/dL — AB (ref 65–99)
Glucose-Capillary: 212 mg/dL — ABNORMAL HIGH (ref 65–99)
Glucose-Capillary: 155 mg/dL — ABNORMAL HIGH (ref 65–99)

## 2017-02-19 LAB — ABO/RH: ABO/RH(D): O NEG

## 2017-02-19 LAB — TYPE AND SCREEN
ABO/RH(D): O NEG
Antibody Screen: NEGATIVE

## 2017-02-19 LAB — PROTIME-INR
INR: 2.09
INR: 1.49
PROTHROMBIN TIME: 23.8 s — AB (ref 11.4–15.2)
Prothrombin Time: 18.2 seconds — ABNORMAL HIGH (ref 11.4–15.2)

## 2017-02-19 LAB — BASIC METABOLIC PANEL
Anion gap: 12 (ref 5–15)
BUN: 14 mg/dL (ref 6–20)
CHLORIDE: 105 mmol/L (ref 101–111)
CO2: 20 mmol/L — AB (ref 22–32)
CREATININE: 1.32 mg/dL — AB (ref 0.44–1.00)
Calcium: 8.1 mg/dL — ABNORMAL LOW (ref 8.9–10.3)
GFR calc non Af Amer: 37 mL/min — ABNORMAL LOW (ref >60)
GFR, EST AFRICAN AMERICAN: 43 mL/min — AB (ref >60)
GLUCOSE: 232 mg/dL — AB (ref 65–99)
Potassium: 3.9 mmol/L (ref 3.5–5.1)
Sodium: 137 mmol/L (ref 135–145)

## 2017-02-19 LAB — CBC
HCT: 31 % — ABNORMAL LOW (ref 36.0–46.0)
HEMOGLOBIN: 10.2 g/dL — AB (ref 12.0–15.0)
MCH: 31.3 pg (ref 26.0–34.0)
MCHC: 32.9 g/dL (ref 30.0–36.0)
MCV: 95.1 fL (ref 78.0–100.0)
PLATELETS: 195 10*3/uL (ref 150–400)
RBC: 3.26 MIL/uL — AB (ref 3.87–5.11)
RDW: 12.9 % (ref 11.5–15.5)
WBC: 8.9 10*3/uL (ref 4.0–10.5)

## 2017-02-19 SURGERY — ERCP, WITH INTERVENTION IF INDICATED
Anesthesia: General

## 2017-02-19 MED ORDER — METOPROLOL TARTRATE 25 MG PO TABS
25.0000 mg | ORAL_TABLET | Freq: Two times a day (BID) | ORAL | Status: DC
  Administered 2017-02-19 – 2017-02-23 (×8): 25 mg via ORAL
  Filled 2017-02-19 (×9): qty 1

## 2017-02-19 MED ORDER — SODIUM CHLORIDE 0.9 % IV SOLN
INTRAVENOUS | Status: DC
  Administered 2017-02-19 (×2): via INTRAVENOUS

## 2017-02-19 MED ORDER — ONDANSETRON HCL 4 MG/2ML IJ SOLN
INTRAMUSCULAR | Status: DC | PRN
  Administered 2017-02-19: 4 mg via INTRAVENOUS

## 2017-02-19 MED ORDER — PROPOFOL 10 MG/ML IV BOLUS
INTRAVENOUS | Status: DC | PRN
  Administered 2017-02-19: 150 mg via INTRAVENOUS

## 2017-02-19 MED ORDER — INDOMETHACIN 50 MG RE SUPP
RECTAL | Status: AC
  Filled 2017-02-19: qty 2

## 2017-02-19 MED ORDER — FENTANYL CITRATE (PF) 100 MCG/2ML IJ SOLN: 25.0000 ug | INTRAMUSCULAR | Status: DC | PRN

## 2017-02-19 MED ORDER — SODIUM CHLORIDE 0.9 % IV SOLN: Freq: Once | INTRAVENOUS | Status: DC

## 2017-02-19 MED ORDER — SUGAMMADEX SODIUM 200 MG/2ML IV SOLN
INTRAVENOUS | Status: DC | PRN
  Administered 2017-02-19: 185 mg via INTRAVENOUS

## 2017-02-19 MED ORDER — ENALAPRIL MALEATE 2.5 MG PO TABS
2.5000 mg | ORAL_TABLET | Freq: Every day | ORAL | Status: DC
  Filled 2017-02-19: qty 1

## 2017-02-19 MED ORDER — MEPERIDINE HCL 25 MG/ML IJ SOLN: 6.2500 mg | INTRAMUSCULAR | Status: DC | PRN

## 2017-02-19 MED ORDER — SODIUM CHLORIDE 0.9 % IV SOLN
INTRAVENOUS | Status: DC | PRN
  Administered 2017-02-19: 30 mL

## 2017-02-19 MED ORDER — IOPAMIDOL (ISOVUE-300) INJECTION 61%
INTRAVENOUS | Status: AC
  Filled 2017-02-19: qty 50

## 2017-02-19 MED ORDER — FENTANYL CITRATE (PF) 100 MCG/2ML IJ SOLN
INTRAMUSCULAR | Status: DC | PRN
  Administered 2017-02-19 (×2): 50 ug via INTRAVENOUS

## 2017-02-19 MED ORDER — OXYCODONE-ACETAMINOPHEN 5-325 MG PO TABS
1.0000 | ORAL_TABLET | Freq: Once | ORAL | Status: AC
  Administered 2017-02-19: 1 via ORAL
  Filled 2017-02-19: qty 1

## 2017-02-19 MED ORDER — ROCURONIUM BROMIDE 100 MG/10ML IV SOLN
INTRAVENOUS | Status: DC | PRN
  Administered 2017-02-19: 50 mg via INTRAVENOUS

## 2017-02-19 MED ORDER — GLUCAGON HCL RDNA (DIAGNOSTIC) 1 MG IJ SOLR
INTRAMUSCULAR | Status: AC
  Filled 2017-02-19: qty 1

## 2017-02-19 NOTE — Op Note (Signed)
[facility] Patient Name: Daisy Pearson Procedure Date : 02/19/2017 MRN: [id number] Attending MD: Clarene Essex , MD Date of Birth: 05-26-1934 CSN: [id number] Age: 83 Admit Type: Inpatient Procedure:                ERCP Indications:              Bile duct stone(s) Providers:                Clarene Essex, MD, Cleda Daub, RN, Cherylynn Ridges,                            Technician, Phill Myron. Proofreader, CRNA, Otis Brace, MD Referring MD:              Medicines:                General Anesthesia Complications:            No immediate complications. Estimated Blood Loss:     Estimated blood loss was minimal. Procedure:                Pre-Anesthesia Assessment:                           - Prior to the procedure, a History and Physical                            was performed, and patient medications and                            allergies were reviewed. The patient's tolerance of                            previous anesthesia was also reviewed. The risks                            and benefits of the procedure and the sedation                            options and risks were discussed with the patient.                            All questions were answered, and informed consent                            was obtained. Prior Anticoagulants: The patient has                            taken Coumadin (warfarin), last dose was 4 days                            prior to procedure. ASA Grade Assessment: III - A  patient with severe systemic disease. After                            reviewing the risks and benefits, the patient was                            deemed in satisfactory condition to undergo the                            procedure.                           After obtaining informed consent, the scope was                            passed under direct vision. Throughout the                            procedure,  the patient's blood pressure, pulse, and                            oxygen saturations were monitored continuously. The                            Duodenoscope was introduced through the mouth, and                            used to inject contrast into and used to inject                            contrast into the bile duct. The ERCP was performed                            with moderate difficulty due to challenging                            cannulation because of papillary stenosis. The                            patient tolerated the procedure well. Scope In: Scope Out: Findings:      The scout film was normal. The major papilla contained a benign       appearing stenosis. there was small periampullary diverticulum. The bile       duct was deeply cannulated. Contrast was injected. I personally       interpreted the bile duct images. Ductal flow of contrast was adequate.       The common bile duct contained four small stones, the largest of which       was small in diameter. The common bile duct was moderately dilated,       acquired. The largest diameter was 13 mm. Biliary sphincterotomy was       made with a braided traction (standard) sphincterotome using ERBE       electrocautery. There was no post-sphincterotomy bleeding. The biliary       tree was swept with a  9- 12 mm balloon starting at the upper third of       the main bile duct. Sludge was swept from the duct. Four small stones       were removed. No stones remained. balloon occlusive cholangiogram showed       no further filling defects. There was good drainage of contrast at the       end of procedure. Pancreatic duct was not cannulated. Gastric cavity was       decompressed. Procedure was completed. Impression:               - The major papilla appeared to be stenotic.                           - The common bile duct was moderately dilated,                            acquired.                           -  Choledocholithiasis was found. Complete removal                            was accomplished by biliary sphincterotomy and                            balloon extraction.                           - A biliary sphincterotomy was performed.                           - The biliary tree was swept. Recommendation:           - Return patient to hospital ward for ongoing care.                           - Clear liquid diet, slowly advance diet as                            tolerated                           - Resume Coumadin (warfarin) at prior dose tomorrow                            if no delayed complication                           - Check liver enzymes (AST, ALT, alkaline                            phosphatase, bilirubin) in the morning. Procedure Code(s):        --- Professional ---                           [phone], Endoscopic retrograde  cholangiopancreatography (ERCP); with removal of                            calculi/debris from biliary/pancreatic duct(s)                           43262, Endoscopic retrograde                            cholangiopancreatography (ERCP); with                            sphincterotomy/papillotomy Diagnosis Code(s):        --- Professional ---                           K83.9, Disease of biliary tract, unspecified                           K80.50, Calculus of bile duct without cholangitis                            or cholecystitis without obstruction                           K83.8, Other specified diseases of biliary tract CPT copyright 2016 American [employer] Association. All rights reserved. The codes documented in this report are preliminary and upon coder review may  be revised to meet current compliance requirements. Clarene Essex, MD 02/19/2017 3:34:36 PM This report has been signed electronically. Otis Brace, MD Number of Addenda: 0

## 2017-02-19 NOTE — Progress Notes (Signed)
Internal Medicine Attending:   I saw and examined the patient. I reviewed the resident's note and I agree with the resident's findings and plan as documented in the resident's note.  Patient continues to have intermittent epigastric pain and nausea and poor by mouth intake. She was initially admitted with the symptoms and found to have an acute pancreatitis likely secondary to gallstones. MRCP done showed choledocholithiasis with mild diffuse ductal dilatation. Coumadin is on hold. Patient to be taken for ERCP today. Surgery follow-up and recommendations appreciated. Postop would not resume Coumadin at this time. We'll start heparin drip postop for anticoagulation for her A. fib. Surgery would like cardiology clearance prior to her cholecystectomy. We'll obtain a cardiology consult. Patient also noted to be hypertensive today. We'll resume her metoprolol and ACE inhibitor (home medications) today after her ERCP.

## 2017-02-19 NOTE — Progress Notes (Signed)
Shwanda J Voiles 1:41 PM  Subjective: Patient doing better than when she was admitted but still having some abdominal pain requiring narcotics and still does not want to eat and her hospital computer chart was reviewed and her case discussed with her family as well as herself and my partner Dr. Jacinto Reap and she has no new complaints  Objective: Vital signs stable afebrile no acute distress exam please see preassessment evaluation abdomen minimal discomfort but soft occasional bowel sounds no guarding or rebound labs okay white count decreased MRCP reviewed  Assessment: Gallstone pancreatitis  Plan: The risks benefits methods and success rate of ERCP was discussed with the patient and her family and will proceed this afternoon with further workup and plans pending those findings  [facility]  Pager [phone] After 5PM or if no answer call [phone]

## 2017-02-19 NOTE — Consult Note (Signed)
Reason for Consult: Gallstone pancreatitis Referring Physician: Dr. Orie Fisherman CC: Abdominal pain, nausea and vomiting  Daisy Pearson is an 83 y.o. female.  HPI: Patient's a 83 year old female presented with the above symptoms. She described acute onset of epigastric pain associated with nausea vomiting. She reports vomiting about every 20 minutes. Workup in the emergency department 02/14/17 shows she was afebrile and vital signs were stable. CMP shows a glucose of 224, creatinine of 2.06. Lipase of 798. AST of 252 and ALT of 284. Abdominal ultrasound showed no gallstones or gallbladder wall thickening. Bile duct was 4 mm with no evidence for cholelithiasis or acute findings. MR of the abdomen was obtained on 02/17/17, this showed diffuse biliary ductal dilatation due to choledocholithiasis. Diffuse gallbladder wall thickening with nonspecific likely due to acute pancreatitis. Moderate to acute pancreatitis with no evidence of peripancreatic fluid collections. She also had right posterior lobe atelectasis versus an infiltrate. She was seen by Dr.Brahmbhatt, [facility] on 02/15/17, and is scheduled for ERCP today.  Patient has multiple medical comorbidities including chronic anticoagulation for atrial fibrillation. Her INR is down, she scheduled for ERCP today and we have been asked to see for in hospital evaluation and possible cholecystectomy prior to discharge.    Past Medical History:  Diagnosis Date  . Arthritis   . Diabetes mellitus without complication ([place])   . Fibromyalgia   . Hypertension   . Renal disorder     Past Surgical History:  Procedure Laterality Date  . ADRENALECTOMY  2006  . AORTIC VALVE REPLACEMENT (AVR)/CORONARY ARTERY BYPASS GRAFTING (CABG)    . PACEMAKER PLACEMENT  04/05/2013   med[employer] Serial# [id number] Lemmie Evens [id number], SERIAL [id number]  . THYROIDECTOMY  1964    History reviewed. No pertinent family history.  Social History:  reports that she has never  smoked. She has never used smokeless tobacco. She reports that she does not drink alcohol or use drugs.  Allergies:  Allergies  Allergen Reactions  . Esomeprazole Sodium Swelling  . Penicillins Anaphylaxis and Rash    Has patient had a PCN reaction causing immediate rash, facial/tongue/throat swelling, SOB or lightheadedness with hypotension: Yes Has patient had a PCN reaction causing severe rash involving mucus membranes or skin necrosis: No Has patient had a PCN reaction that required hospitalization: No Has patient had a PCN reaction occurring within the last 10 years: No If all of the above answers are "NO", then may proceed with Cephalosporin use.  . Clindamycin/Lincomycin Hives       . Hydrocodone Other (See Comments)    Makes her crazy  . Nsaids Hives and Other (See Comments)    kidney failure  . Valsartan Swelling  . Prilosec [Omeprazole] Swelling and Rash    Medications:  Prior to Admission:  Prescriptions Prior to Admission  Medication Sig Dispense Refill Last Dose  . acetaminophen (TYLENOL) 325 MG tablet Take 650 mg by mouth every 6 (six) hours as needed for headache (pain).   unknown  . alendronate (FOSAMAX) 70 MG tablet Take 70 mg by mouth every Saturday. Take with a full glass of water on an empty stomach.   02/13/2017 at am  . amiodarone (PACERONE) 200 MG tablet Take 200 mg by mouth daily.   02/13/2017 at am  . atorvastatin (LIPITOR) 40 MG tablet Take 40 mg by mouth daily.   02/13/2017 at am  . calcitRIOL (ROCALTROL) 0.5 MCG capsule Take 0.5 mcg by mouth daily.   02/13/2017 at am  . cetirizine (ZYRTEC) 10 MG tablet  Take 10 mg by mouth daily as needed for allergies.   few months ago  . Cholecalciferol (VITAMIN D) 2000 units tablet Take 2,000 Units by mouth daily.   02/13/2017 at am  . enalapril (VASOTEC) 2.5 MG tablet Take 2.5 mg by mouth daily.   02/13/2017 at am  . EPINEPHrine (EPIPEN 2-PAK) 0.3 mg/0.3 mL IJ SOAJ injection Inject 0.3 mg into the muscle once as needed  (severe allergic reaction).   never used  . Insulin Detemir (LEVEMIR FLEXTOUCH) 100 UNIT/ML Pen Inject 18 Units into the skin 2 (two) times daily.   02/13/2017 at bedtime  . Insulin Glulisine (APIDRA SOLOSTAR) 100 UNIT/ML Solostar Pen Inject 12 Units into the skin See admin instructions. Inject 12 units subcutaneously three times daily before meals - plus adjustment if CBG >200 - 1 unit for every 20   02/13/2017 at supper  . metaxalone (SKELAXIN) 800 MG tablet Take 800 mg by mouth 3 (three) times daily as needed for muscle spasms.   02/13/2017 at am  . Methylfol-Methylcob-Acetylcyst (CEREFOLIN NAC) 6-2-600 MG TABS Take 1 tablet by mouth daily.   02/13/2017 at am  . metoprolol tartrate (LOPRESSOR) 25 MG tablet Take 25 mg by mouth 2 (two) times daily.   02/13/2017 at 2000  . mirtazapine (REMERON) 15 MG tablet Take 15 mg by mouth at bedtime.   02/13/2017 at pm  . nitroGLYCERIN (NITROSTAT) 0.4 MG SL tablet Place 0.4 mg under the tongue every 5 (five) minutes as needed for chest pain.   never taken  . Omega-3 Fatty Acids (OMEGA 3 PO) Take 1 capsule by mouth daily.   02/13/2017 at am  . oxyCODONE-acetaminophen (PERCOCET/ROXICET) 5-325 MG tablet Take 1 tablet by mouth 2 (two) times daily as needed for severe pain.   02/13/2017 at pm  . QUEtiapine (SEROQUEL) 25 MG tablet Take 25 mg by mouth at bedtime.   02/13/2017 at pm  . warfarin (JANTOVEN) 4 MG tablet Take 2-4 mg by mouth See admin instructions. Take 1/2 tablet (2 mg) by mouth on Saturday at bedtime, take 1 tablet (4 mg) on all other nights   02/13/2017 at 2000   Scheduled: . famotidine  20 mg Oral Daily  . insulin aspart  0-9 Units Subcutaneous TID WC   Continuous: . sodium chloride    . sodium chloride    . levofloxacin (LEVAQUIN) IV     XHB:[id number], promethazine Anti-infectives    Start     Dose/Rate Route Frequency Ordered Stop   02/19/17 1200  levofloxacin (LEVAQUIN) IVPB 500 mg    Comments:  Pre-ERCP Levaquin . Patient is scheduled  for ERCP tomorrow at 1 pm   500 mg 100 mL/hr over 60 Minutes Intravenous  Once 02/18/17 1404     02/18/17 1415  levofloxacin (LEVAQUIN) IVPB 500 mg  Status:  Discontinued    Comments:  Pre-ERCP Levaquin . Patient is scheduled for ERCP tomorrow at 1 pm   500 mg 100 mL/hr over 60 Minutes Intravenous  Once 02/18/17 1404 02/18/17 1404   02/14/17 1900  ciprofloxacin (CIPRO) IVPB 400 mg  Status:  Discontinued     400 mg 200 mL/hr over 60 Minutes Intravenous  Once 02/14/17 1847 02/14/17 1919   02/14/17 1900  metroNIDAZOLE (FLAGYL) IVPB 500 mg  Status:  Discontinued     500 mg 100 mL/hr over 60 Minutes Intravenous  Once 02/14/17 1847 02/14/17 1919      Results for orders placed or performed during the hospital encounter of 02/14/17 (from the past  48 hour(s))  Glucose, capillary     Status: Abnormal   Collection Time: 02/17/17 12:03 PM  Result Value Ref Range   Glucose-Capillary 196 (H) 65 - 99 mg/dL  Glucose, capillary     Status: Abnormal   Collection Time: 02/17/17  5:05 PM  Result Value Ref Range   Glucose-Capillary 146 (H) 65 - 99 mg/dL  Glucose, capillary     Status: Abnormal   Collection Time: 02/17/17  9:12 PM  Result Value Ref Range   Glucose-Capillary 139 (H) 65 - 99 mg/dL  Protime-INR     Status: Abnormal   Collection Time: 02/18/17  4:50 AM  Result Value Ref Range   Prothrombin Time 28.8 (H) 11.4 - 15.2 seconds   INR 2.66   CBC     Status: Abnormal   Collection Time: 02/18/17  4:50 AM  Result Value Ref Range   WBC 12.1 (H) 4.0 - 10.5 K/uL   RBC 3.37 (L) 3.87 - 5.11 MIL/uL   Hemoglobin 10.4 (L) 12.0 - 15.0 g/dL   HCT 32.6 (L) 36.0 - 46.0 %   MCV 96.7 78.0 - 100.0 fL   MCH 30.9 26.0 - 34.0 pg   MCHC 31.9 30.0 - 36.0 g/dL   RDW 13.1 11.5 - 15.5 %   Platelets 199 150 - 400 K/uL  Basic metabolic panel     Status: Abnormal   Collection Time: 02/18/17  4:50 AM  Result Value Ref Range   Sodium 139 135 - 145 mmol/L   Potassium 4.2 3.5 - 5.1 mmol/L   Chloride 109 101 - 111  mmol/L   CO2 21 (L) 22 - 32 mmol/L   Glucose, Bld 169 (H) 65 - 99 mg/dL   BUN 15 6 - 20 mg/dL   Creatinine, Ser 1.25 (H) 0.44 - 1.00 mg/dL   Calcium 8.2 (L) 8.9 - 10.3 mg/dL   GFR calc non Af Amer 39 (L) >60 mL/min   GFR calc Af Amer 45 (L) >60 mL/min    Comment: (NOTE) The eGFR has been calculated using the CKD EPI equation. This calculation has not been validated in all clinical situations. eGFR's persistently <60 mL/min signify possible Chronic Kidney Disease.    Anion gap 9 5 - 15  Lipase, blood     Status: Abnormal   Collection Time: 02/18/17  4:50 AM  Result Value Ref Range   Lipase 167 (H) 11 - 51 U/L  Hepatic function panel     Status: Abnormal   Collection Time: 02/18/17  4:50 AM  Result Value Ref Range   Total Protein 5.9 (L) 6.5 - 8.1 g/dL   Albumin 2.7 (L) 3.5 - 5.0 g/dL   AST 25 15 - 41 U/L   ALT 77 (H) 14 - 54 U/L   Alkaline Phosphatase 98 38 - 126 U/L   Total Bilirubin 1.1 0.3 - 1.2 mg/dL   Bilirubin, Direct 0.2 0.1 - 0.5 mg/dL   Indirect Bilirubin 0.9 0.3 - 0.9 mg/dL  Glucose, capillary     Status: Abnormal   Collection Time: 02/18/17  9:22 AM  Result Value Ref Range   Glucose-Capillary 212 (H) 65 - 99 mg/dL   Comment 1 Notify RN   Glucose, capillary     Status: Abnormal   Collection Time: 02/18/17 12:30 PM  Result Value Ref Range   Glucose-Capillary 214 (H) 65 - 99 mg/dL  Glucose, capillary     Status: Abnormal   Collection Time: 02/18/17  5:01 PM  Result Value Ref  Range   Glucose-Capillary 174 (H) 65 - 99 mg/dL  Protime-INR     Status: Abnormal   Collection Time: 02/18/17  7:18 PM  Result Value Ref Range   Prothrombin Time 29.0 (H) 11.4 - 15.2 seconds   INR 2.68   Glucose, capillary     Status: Abnormal   Collection Time: 02/18/17  8:35 PM  Result Value Ref Range   Glucose-Capillary 167 (H) 65 - 99 mg/dL  Glucose, capillary     Status: Abnormal   Collection Time: 02/18/17 10:27 PM  Result Value Ref Range   Glucose-Capillary 218 (H) 65 - 99  mg/dL  Protime-INR     Status: Abnormal   Collection Time: 02/19/17  5:17 AM  Result Value Ref Range   Prothrombin Time 23.8 (H) 11.4 - 15.2 seconds   INR 6.06   Basic metabolic panel     Status: Abnormal   Collection Time: 02/19/17  7:18 AM  Result Value Ref Range   Sodium 137 135 - 145 mmol/L   Potassium 3.9 3.5 - 5.1 mmol/L   Chloride 105 101 - 111 mmol/L   CO2 20 (L) 22 - 32 mmol/L   Glucose, Bld 232 (H) 65 - 99 mg/dL   BUN 14 6 - 20 mg/dL   Creatinine, Ser 1.32 (H) 0.44 - 1.00 mg/dL   Calcium 8.1 (L) 8.9 - 10.3 mg/dL   GFR calc non Af Amer 37 (L) >60 mL/min   GFR calc Af Amer 43 (L) >60 mL/min    Comment: (NOTE) The eGFR has been calculated using the CKD EPI equation. This calculation has not been validated in all clinical situations. eGFR's persistently <60 mL/min signify possible Chronic Kidney Disease.    Anion gap 12 5 - 15  CBC     Status: Abnormal   Collection Time: 02/19/17  7:18 AM  Result Value Ref Range   WBC 8.9 4.0 - 10.5 K/uL   RBC 3.26 (L) 3.87 - 5.11 MIL/uL   Hemoglobin 10.2 (L) 12.0 - 15.0 g/dL   HCT 31.0 (L) 36.0 - 46.0 %   MCV 95.1 78.0 - 100.0 fL   MCH 31.3 26.0 - 34.0 pg   MCHC 32.9 30.0 - 36.0 g/dL   RDW 12.9 11.5 - 15.5 %   Platelets 195 150 - 400 K/uL  Glucose, capillary     Status: Abnormal   Collection Time: 02/19/17  8:19 AM  Result Value Ref Range   Glucose-Capillary 207 (H) 65 - 99 mg/dL  Prepare fresh frozen plasma     Status: None (Preliminary result)   Collection Time: 02/19/17  8:44 AM  Result Value Ref Range   Unit Number [id number]    Blood Component Type THAWED PLASMA    Unit division 00    Status of Unit ISSUED    Transfusion Status OK TO TRANSFUSE    Unit Number [id number]    Blood Component Type THAWED PLASMA    Unit division 00    Status of Unit ALLOCATED    Transfusion Status OK TO TRANSFUSE   Type and screen Calvin     Status: None   Collection Time: 02/19/17  8:54 AM  Result Value Ref  Range   ABO/RH(D) O NEG    Antibody Screen NEG    Sample Expiration 02/22/2017     Mr Abdomen Mrcp Wo Contrast  Result Date: 02/18/2017 CLINICAL DATA:  Severe abdominal pain. Nausea and vomiting. Elevated liver function test. Acute hepatitis. EXAM: MRI ABDOMEN  WITHOUT CONTRAST  (INCLUDING MRCP) TECHNIQUE: Multiplanar multisequence MR imaging of the abdomen was performed. Heavily T2-weighted images of the biliary and pancreatic ducts were obtained, and three-dimensional MRCP images were rendered by post processing. The patient had severe back pain, and not all sequences on our standard protocol could be performed. FINDINGS: Lower chest: Atelectasis or infiltrate in right posterior lung base. Hepatobiliary: No hepatic masses identified. Diffuse gallbladder wall thickening is seen which is nonspecific and is likely related to acute pancreatitis. At least 1 and possibly 2 calculi are seen in the distal common bile duct measuring up to 5 mm. Mild diffuse biliary ductal dilatation seen with common bile duct measuring 10 mm. Pancreas: Diffuse pancreatic edema and moderate peripancreatic inflammatory changes are seen, consistent with acute pancreatitis. No definite mass visualized on this unenhanced exam. No evidence of pancreatic ductal dilatation or pancreas divisum. No peripancreatic fluid collections identified. Spleen:  Within normal limits in size and appearance. Adrenals/Urinary Tract: No masses identified. 1.5 cm right renal cyst noted. No evidence of hydronephrosis. Stomach/Bowel: Visualized portions within the abdomen are unremarkable. Vascular/Lymphatic: No pathologically enlarged lymph nodes identified. No abdominal aortic aneurysm. Other:  None. Musculoskeletal:  No suspicious bone lesions identified. IMPRESSION: Mild diffuse biliary ductal dilatation due to choledocholithiasis. Diffuse gallbladder wall thickening is nonspecific, but likely due to acute pancreatitis. Moderate acute pancreatitis. No  evidence of peripancreatic fluid collections. Posterior right lower lobe atelectasis versus infiltrate. Electronically Signed   By: Earle Gell M.D.   On: 02/18/2017 08:13   Mr 3d Recon At Scanner  Result Date: 02/18/2017 CLINICAL DATA:  Severe abdominal pain. Nausea and vomiting. Elevated liver function test. Acute hepatitis. EXAM: MRI ABDOMEN WITHOUT CONTRAST  (INCLUDING MRCP) TECHNIQUE: Multiplanar multisequence MR imaging of the abdomen was performed. Heavily T2-weighted images of the biliary and pancreatic ducts were obtained, and three-dimensional MRCP images were rendered by post processing. The patient had severe back pain, and not all sequences on our standard protocol could be performed. FINDINGS: Lower chest: Atelectasis or infiltrate in right posterior lung base. Hepatobiliary: No hepatic masses identified. Diffuse gallbladder wall thickening is seen which is nonspecific and is likely related to acute pancreatitis. At least 1 and possibly 2 calculi are seen in the distal common bile duct measuring up to 5 mm. Mild diffuse biliary ductal dilatation seen with common bile duct measuring 10 mm. Pancreas: Diffuse pancreatic edema and moderate peripancreatic inflammatory changes are seen, consistent with acute pancreatitis. No definite mass visualized on this unenhanced exam. No evidence of pancreatic ductal dilatation or pancreas divisum. No peripancreatic fluid collections identified. Spleen:  Within normal limits in size and appearance. Adrenals/Urinary Tract: No masses identified. 1.5 cm right renal cyst noted. No evidence of hydronephrosis. Stomach/Bowel: Visualized portions within the abdomen are unremarkable. Vascular/Lymphatic: No pathologically enlarged lymph nodes identified. No abdominal aortic aneurysm. Other:  None. Musculoskeletal:  No suspicious bone lesions identified. IMPRESSION: Mild diffuse biliary ductal dilatation due to choledocholithiasis. Diffuse gallbladder wall thickening is  nonspecific, but likely due to acute pancreatitis. Moderate acute pancreatitis. No evidence of peripancreatic fluid collections. Posterior right lower lobe atelectasis versus infiltrate. Electronically Signed   By: Earle Gell M.D.   On: 02/18/2017 08:13    Review of Systems  Constitutional: Negative for chills, diaphoresis, fever, malaise/fatigue and weight loss.  HENT: Negative.   Eyes: Negative.   Respiratory: Positive for shortness of breath (she can walk several hundred feet if she has to.). Negative for cough, hemoptysis, sputum production and wheezing.   Cardiovascular:  Positive for orthopnea, leg swelling and PND. Negative for chest pain, palpitations and claudication.  Gastrointestinal: Positive for abdominal pain and nausea. Negative for blood in stool, constipation, diarrhea, heartburn, melena and vomiting.  Genitourinary: Negative.   Musculoskeletal: Positive for back pain, joint pain and myalgias.       She has fibromyalgia and it affects her knees and ankles primarily, but also affects shoulders and spine. She takes Percocet and gets 60 per month she usually takes at least a couple per day.  Skin: Negative.   Neurological: Negative for weakness.       She had a stroke in 2010. Treated with TPA in good reversal. She had a TBI 2 years ago and has occasional episodes of a fascia.  Endo/Heme/Allergies: Bruises/bleeds easily (She is on chronic Coumadin).  Psychiatric/Behavioral: Negative.    Blood pressure (!) 162/68, pulse 62, temperature 98.5 F (36.9 C), temperature source Oral, resp. rate 18, height _0  (1.626 m), weight 92.5 kg (204 lb), SpO2 99 %. Physical Exam  Constitutional: She is oriented to person, place, and time. She appears well-developed and well-nourished. No distress.  Elderly female in no acute distress.   HENT:  Head: Normocephalic.  Mouth/Throat: No oropharyngeal exudate.  Eyes: Right eye exhibits no discharge. Left eye exhibits no discharge. No scleral  icterus.  Pupils are equal  Neck: Normal range of motion. Neck supple. No JVD present. No tracheal deviation present. No thyromegaly present.  Cardiovascular: Normal rate, regular rhythm, normal heart sounds and intact distal pulses.   No murmur heard. Respiratory: Effort normal and breath sounds normal. No respiratory distress. She has no wheezes. She has no rales.  Well-healed sternotomy scar. Permanent transvenous pacemaker on the right side of her chest.  GI: Soft. Bowel sounds are normal. She exhibits no distension and no mass. There is tenderness (Still very tender mid abdomen). There is no rebound and no guarding.  Musculoskeletal: She exhibits no edema (trace) or tenderness.  Lymphadenopathy:    She has no cervical adenopathy.  Neurological: She is alert and oriented to person, place, and time. No cranial nerve deficit.  Skin: Skin is warm and dry. No rash noted. She is not diaphoretic. No erythema. No pallor.  Psychiatric: She has a normal mood and affect. Her behavior is normal. Judgment and thought content normal.   . sodium chloride    . sodium chloride    . levofloxacin (LEVAQUIN) IV     Anti-infectives    Start     Dose/Rate Route Frequency Ordered Stop   02/19/17 1200  levofloxacin (LEVAQUIN) IVPB 500 mg    Comments:  Pre-ERCP Levaquin . Patient is scheduled for ERCP tomorrow at 1 pm   500 mg 100 mL/hr over 60 Minutes Intravenous  Once 02/18/17 1404     02/18/17 1415  levofloxacin (LEVAQUIN) IVPB 500 mg  Status:  Discontinued    Comments:  Pre-ERCP Levaquin . Patient is scheduled for ERCP tomorrow at 1 pm   500 mg 100 mL/hr over 60 Minutes Intravenous  Once 02/18/17 1404 02/18/17 1404   02/14/17 1900  ciprofloxacin (CIPRO) IVPB 400 mg  Status:  Discontinued     400 mg 200 mL/hr over 60 Minutes Intravenous  Once 02/14/17 1847 02/14/17 1919   02/14/17 1900  metroNIDAZOLE (FLAGYL) IVPB 500 mg  Status:  Discontinued     500 mg 100 mL/hr over 60 Minutes Intravenous  Once  02/14/17 1847 02/14/17 1919      Assessment/Plan: Gallstone pancreatitis//choledocholithiasis by MRCP. Scheduled  for ERCP 02/19/17 CAD, status post CABG Chronic atrial fibrillation on Coumadin Permanent transvenous pacemaker Type 2 diabetes Hypertension Hyperlipidemia Fibromyalgia with chronic pain  -  Percocet/60 per month. Chronic kidney disease Body mass index is 35 FEN:  IV fluids/NPO ID: Cipro/Flagyl 1 dose 02/14/17;  Levaquin preop ERCP DVT:  On Coumadin and reversing INR 2.09 this AM  Plan: Patient still tender on exam so she still has some pancreatitis. She is scheduled for ERCP today. INR is being corrected. I would leave her off Coumadin and place her on heparin after her ERCP. I recommended cardiology clearance, she has not seen her cardiologist for about 6 months. When her pancreatitis has resolved and she has cardiac clearance will arrange for cholecystectomy. Will recheck labs and follow with you.  Abbiegail Landgren 02/19/2017, 11:24 AM

## 2017-02-19 NOTE — Anesthesia Preprocedure Evaluation (Addendum)
Anesthesia Evaluation    Reviewed: Allergy & Precautions, Patient's Chart, lab work & pertinent test results  Airway Mallampati: III  TM Distance: >3 FB Neck ROM: Full    Dental no notable dental hx. (+) Edentulous Lower, Edentulous Upper   Pulmonary neg pulmonary ROS,    Pulmonary exam normal breath sounds clear to auscultation       Cardiovascular hypertension, negative cardio ROS Normal cardiovascular exam+ dysrhythmias Atrial Fibrillation  Rhythm:Regular Rate:Normal     Neuro/Psych negative neurological ROS     GI/Hepatic negative GI ROS, Neg liver ROS,   Endo/Other  negative endocrine ROSdiabetes, Type 2  Renal/GU negative Renal ROS     Musculoskeletal negative musculoskeletal ROS (+) Arthritis , Osteoarthritis,  Fibromyalgia -  Abdominal   Peds  Hematology negative hematology ROS (+)   Anesthesia Other Findings Day of surgery medications reviewed with the patient.  Reproductive/Obstetrics                           Anesthesia Physical Anesthesia Plan  ASA: III  Anesthesia Plan: General   Post-op Pain Management:    Induction: Intravenous  PONV Risk Score and Plan: 2 and Ondansetron, Dexamethasone and Treatment may vary due to age or medical condition  Airway Management Planned: Oral ETT  Additional Equipment:   Intra-op Plan:   Post-operative Plan: Extubation in OR  Informed Consent:   Plan Discussed with:   Anesthesia Plan Comments: (  )                                         Anesthesia Evaluation  Patient identified by MRN, date of birth, ID band  Reviewed: Allergy & Precautions, NPO status , Patient's Chart, lab work & pertinent test results  Airway Mallampati: III  TM Distance: >3 FB Neck ROM: Full    Dental no notable dental hx. (+) Edentulous Upper, Edentulous Lower   Pulmonary neg pulmonary ROS,    Pulmonary exam normal breath sounds  clear to auscultation       Cardiovascular hypertension, + CAD  negative cardio ROS Normal cardiovascular exam+ dysrhythmias Atrial Fibrillation + pacemaker  Rhythm:Regular Rate:Normal     Neuro/Psych negative neurological ROS     GI/Hepatic negative GI ROS, Neg liver ROS,   Endo/Other  negative endocrine ROSdiabetes, Type 2  Renal/GU negative Renal ROS     Musculoskeletal negative musculoskeletal ROS (+) Arthritis , Osteoarthritis,  Fibromyalgia -  Abdominal   Peds  Hematology negative hematology ROS (+)   Anesthesia Other Findings - Left ventricle: The cavity size was normal. Wall thickness was   increased in a pattern of mild LVH. Systolic function was normal.   The estimated ejection fraction was in the range of 60% to 65%.   Features are consistent with a pseudonormal left ventricular   filling pattern, with concomitant abnormal relaxation and   increased filling pressure (grade 2 diastolic dysfunction).  Reproductive/Obstetrics                          Anesthesia Physical  Anesthesia Plan  ASA: III  Anesthesia Plan: General   Post-op Pain Management:    Induction: Intravenous  PONV Risk Score and Plan: 2 and Ondansetron, Dexamethasone and Treatment may vary due to age or medical condition  Airway Management Planned: Oral ETT  Additional Equipment:  Intra-op Plan:   Post-operative Plan: Extubation in OR  Informed Consent: I have reviewed the patients History and Physical, chart, labs and discussed the procedure including the risks, benefits and alternatives for the proposed anesthesia with the patient or authorized representative who has indicated his/her understanding and acceptance.   Dental advisory given  Plan Discussed with:   Anesthesia Plan Comments: (  )        Anesthesia Quick Evaluation                                   Anesthesia Evaluation    Airway        Dental   Pulmonary            Cardiovascular hypertension, + dysrhythmias Atrial Fibrillation      Neuro/Psych    GI/Hepatic   Endo/Other  diabetes, Type 2  Renal/GU      Musculoskeletal  (+) Arthritis , Osteoarthritis,  Fibromyalgia -  Abdominal   Peds  Hematology   Anesthesia Other Findings   Reproductive/Obstetrics                             Anesthesia Physical Anesthesia Plan Anesthesia Quick Evaluation                                   Anesthesia Evaluation    Reviewed: Allergy & Precautions, Patient's Chart, lab work & pertinent test results  Airway        Dental   Pulmonary neg pulmonary ROS,           Cardiovascular hypertension, negative cardio ROS  + dysrhythmias Atrial Fibrillation      Neuro/Psych negative neurological ROS     GI/Hepatic negative GI ROS, Neg liver ROS,   Endo/Other  negative endocrine ROSdiabetes, Type 2  Renal/GU negative Renal ROS     Musculoskeletal negative musculoskeletal ROS (+) Arthritis , Osteoarthritis,  Fibromyalgia -  Abdominal   Peds  Hematology negative hematology ROS (+)   Anesthesia Other Findings Day of surgery medications reviewed with the patient.  Reproductive/Obstetrics                            Anesthesia Physical Anesthesia Plan Anesthesia Quick Evaluation                                   Anesthesia Evaluation    Airway        Dental   Pulmonary           Cardiovascular hypertension, + dysrhythmias Atrial Fibrillation      Neuro/Psych    GI/Hepatic   Endo/Other  diabetes, Type 2  Renal/GU      Musculoskeletal  (+) Arthritis , Osteoarthritis,  Fibromyalgia -  Abdominal   Peds  Hematology   Anesthesia Other Findings   Reproductive/Obstetrics                             Anesthesia Physical Anesthesia Plan Anesthesia Quick Evaluation  Anesthesia Quick Evaluation  Anesthesia Evaluation    Airway        Dental   Pulmonary           Cardiovascular hypertension, + dysrhythmias Atrial Fibrillation      Neuro/Psych    GI/Hepatic   Endo/Other  diabetes, Type 2  Renal/GU      Musculoskeletal  (+) Arthritis , Osteoarthritis,  Fibromyalgia -  Abdominal   Peds  Hematology   Anesthesia Other Findings   Reproductive/Obstetrics                             Anesthesia Physical Anesthesia Plan Anesthesia Quick Evaluation                                   Anesthesia Evaluation    Reviewed: Allergy & Precautions, Patient's Chart, lab work & pertinent test results  Airway        Dental   Pulmonary neg pulmonary ROS,           Cardiovascular hypertension, negative cardio ROS  + dysrhythmias Atrial Fibrillation      Neuro/Psych negative neurological ROS     GI/Hepatic negative GI ROS, Neg liver ROS,   Endo/Other  negative endocrine ROSdiabetes, Type 2  Renal/GU negative Renal ROS     Musculoskeletal negative musculoskeletal ROS (+) Arthritis , Osteoarthritis,  Fibromyalgia -  Abdominal   Peds  Hematology negative hematology ROS (+)   Anesthesia Other Findings Day of surgery medications reviewed with the patient.  Reproductive/Obstetrics                            Anesthesia Physical Anesthesia Plan Anesthesia Quick Evaluation                                   Anesthesia Evaluation    Airway        Dental   Pulmonary           Cardiovascular hypertension, + dysrhythmias Atrial Fibrillation      Neuro/Psych    GI/Hepatic   Endo/Other  diabetes, Type 2  Renal/GU      Musculoskeletal  (+) Arthritis , Osteoarthritis,  Fibromyalgia -  Abdominal   Peds  Hematology   Anesthesia Other Findings   Reproductive/Obstetrics                             Anesthesia  Physical Anesthesia Plan Anesthesia Quick Evaluation

## 2017-02-19 NOTE — Consult Note (Signed)
Cardiology Consult    Patient ID: Daisy Pearson MRN: [id number], DOB/AGE: 27-Oct-1933   Admit date: 02/14/2017 Date of Consult: 02/19/2017  Primary Physician: Karleen Hampshire., MD  Primary Cardiologist: Elinor Parkinson MD  Requesting Provider: Dr. Dareen Piano  Reason for Consult: preoperative cardiac risk evaluation  Patient Profile    Ms. Daisy Pearson has PMH significant for HTN, Afib on amiodarone and coumadin, CAD s/p CABG x 2 (2014), hx of CVA (2010) without residual deficits, DM2, HLD, sinus node dysfunction s/p dual chamber PPM (2014 following CABG). She was admitted to [facility] with abdominal complaints and is being evaluated for cholecystectomy.   Daisy Pearson is a 83 y.o. female who is being seen today for the evaluation of preoperative cardiac risk evaluation at the request of Dr. Dareen Piano.  Past Medical History   Past Medical History:  Diagnosis Date  . Arthritis   . Diabetes mellitus without complication ([place])   . Fibromyalgia   . Hypertension   . Renal disorder     Past Surgical History:  Procedure Laterality Date  . ADRENALECTOMY  2006  . AORTIC VALVE REPLACEMENT (AVR)/CORONARY ARTERY BYPASS GRAFTING (CABG)    . PACEMAKER PLACEMENT  04/05/2013   [facility] Serial# [id number] Daisy Pearson [id number], SERIAL [id number]  . THYROIDECTOMY  1964     Allergies  Allergies  Allergen Reactions  . Esomeprazole Sodium Swelling  . Penicillins Anaphylaxis and Rash    Has patient had a PCN reaction causing immediate rash, facial/tongue/throat swelling, SOB or lightheadedness with hypotension: Yes Has patient had a PCN reaction causing severe rash involving mucus membranes or skin necrosis: No Has patient had a PCN reaction that required hospitalization: No Has patient had a PCN reaction occurring within the last 10 years: No If all of the above answers are "NO", then may proceed with Cephalosporin use.  . Clindamycin/Lincomycin Hives       . Hydrocodone Other (See  Comments)    Makes her crazy  . Nsaids Hives and Other (See Comments)    kidney failure  . Valsartan Swelling  . Prilosec [Omeprazole] Swelling and Rash    History of Present Illness    Ms. Tolan follows with cardiology at [facility] and was last seen 03/09/16. At that time, she was in her usual state of health. She is s/p CABG x 2, hx of Afib and stroke now on amiodarone and coumadin, and s/p dual chamber PPM for sinus node dysfunction. At last interrogation in 11/2016, her PPM was functioning properly.   She denies recent chest pain and shortness of breath (her anginal equivalent). She denies dizziness, lightheadedness, lower extremity swelling, and orthopnea. She can walk approximately one half flight of stairs, limited by her OA and fibromyalgia.   Inpatient Medications    . enalapril  2.5 mg Oral Daily  . [MAR Hold] famotidine  20 mg Oral Daily  . [MAR Hold] insulin aspart  0-9 Units Subcutaneous TID WC  . metoprolol tartrate  25 mg Oral BID     Outpatient Medications    Prior to Admission medications   Medication Sig Start Date End Date Taking? Authorizing Provider  acetaminophen (TYLENOL) 325 MG tablet Take 650 mg by mouth every 6 (six) hours as needed for headache (pain).   Yes [provider]  alendronate (FOSAMAX) 70 MG tablet Take 70 mg by mouth every Saturday. Take with a full glass of water on an empty stomach.   Yes [provider]  amiodarone (PACERONE) 200 MG tablet Take 200  mg by mouth daily.   Yes [provider]  atorvastatin (LIPITOR) 40 MG tablet Take 40 mg by mouth daily.   Yes [provider]  calcitRIOL (ROCALTROL) 0.5 MCG capsule Take 0.5 mcg by mouth daily.   Yes [provider]  cetirizine (ZYRTEC) 10 MG tablet Take 10 mg by mouth daily as needed for allergies.   Yes [provider]  Cholecalciferol (VITAMIN D) 2000 units tablet Take 2,000 Units by mouth daily.   Yes [provider]  enalapril  (VASOTEC) 2.5 MG tablet Take 2.5 mg by mouth daily.   Yes [provider]  EPINEPHrine (EPIPEN 2-PAK) 0.3 mg/0.3 mL IJ SOAJ injection Inject 0.3 mg into the muscle once as needed (severe allergic reaction).   Yes [provider]  Insulin Detemir (LEVEMIR FLEXTOUCH) 100 UNIT/ML Pen Inject 18 Units into the skin 2 (two) times daily.   Yes [provider]  Insulin Glulisine (APIDRA SOLOSTAR) 100 UNIT/ML Solostar Pen Inject 12 Units into the skin See admin instructions. Inject 12 units subcutaneously three times daily before meals - plus adjustment if CBG >200 - 1 unit for every 20   Yes [provider]  metaxalone (SKELAXIN) 800 MG tablet Take 800 mg by mouth 3 (three) times daily as needed for muscle spasms.   Yes [provider]  Methylfol-Methylcob-Acetylcyst (CEREFOLIN NAC) 6-2-600 MG TABS Take 1 tablet by mouth daily.   Yes [provider]  metoprolol tartrate (LOPRESSOR) 25 MG tablet Take 25 mg by mouth 2 (two) times daily.   Yes [provider]  mirtazapine (REMERON) 15 MG tablet Take 15 mg by mouth at bedtime.   Yes [provider]  nitroGLYCERIN (NITROSTAT) 0.4 MG SL tablet Place 0.4 mg under the tongue every 5 (five) minutes as needed for chest pain.   Yes [provider]  Omega-3 Fatty Acids (OMEGA 3 PO) Take 1 capsule by mouth daily.   Yes [provider]  oxyCODONE-acetaminophen (PERCOCET/ROXICET) 5-325 MG tablet Take 1 tablet by mouth 2 (two) times daily as needed for severe pain.   Yes [provider]  QUEtiapine (SEROQUEL) 25 MG tablet Take 25 mg by mouth at bedtime.   Yes [provider]  warfarin (JANTOVEN) 4 MG tablet Take 2-4 mg by mouth See admin instructions. Take 1/2 tablet (2 mg) by mouth on Saturday at bedtime, take 1 tablet (4 mg) on all other nights   Yes [provider]     Family History    Family History  Problem Relation Age of Onset  . Hypertension  Father     Social History    Social History   Social History  . Marital status: Married    Spouse name: N/A  . Number of children: N/A  . Years of education: N/A   Occupational History  . Not on file.   Social History Main Topics  . Smoking status: Never Smoker  . Smokeless tobacco: Never Used  . Alcohol use No  . Drug use: No  . Sexual activity: Not on file   Other Topics Concern  . Not on file   Social History Narrative  . No narrative on file     Review of Systems    General:  No chills, fever, night sweats or weight changes.  Cardiovascular:  No chest pain, dyspnea on exertion, edema, orthopnea, palpitations, paroxysmal nocturnal dyspnea. Dermatological: No rash, lesions/masses Respiratory: No cough, dyspnea Urologic: No hematuria, dysuria Abdominal:   + nausea, vomiting, no  diarrhea, bright red blood per rectum, melena, or hematemesis, abdominal discomfort Neurologic:  No visual changes, changes in mental status. All other systems reviewed and are otherwise negative except as noted above.  Physical Exam    Blood pressure (!) 185/78, pulse 63, temperature 98.3 F (36.8 C), temperature source Oral, resp. rate (!) 26, height 5\' 4"  (1.626 m), weight 204 lb (92.5 kg), SpO2 98 %.  General: Pleasant, NAD Psych: Normal affect. Neuro: Alert and oriented X 3. Moves all extremities spontaneously. HEENT: Normal  Neck: Supple without bruits or JVD. Lungs:  Resp regular and unlabored, CTA. Heart: RRR no s3, s4, or murmurs. Abdomen: Soft, non-tender, non-distended, BS + x 4.  Extremities: No clubbing, cyanosis or edema. DP/PT/Radials 2+ and equal bilaterally.  Labs    Troponin (Point of Care Test) No results for input(s): TROPIPOC in the last 72 hours. No results for input(s): CKTOTAL, CKMB, TROPONINI in the last 72 hours. Lab Results  Component Value Date   WBC 8.9 02/19/2017   HGB 10.2 (L) 02/19/2017   HCT 31.0 (L) 02/19/2017   MCV 95.1 02/19/2017   PLT 195  02/19/2017     Recent Labs Lab 02/18/17 0450 02/19/17 0718  NA 139 137  K 4.2 3.9  CL 109 105  CO2 21* 20*  BUN 15 14  CREATININE 1.25* 1.32*  CALCIUM 8.2* 8.1*  PROT 5.9*  --   BILITOT 1.1  --   ALKPHOS 98  --   ALT 77*  --   AST 25  --   GLUCOSE 169* 232*   Lab Results  Component Value Date   CHOL 111 02/15/2017   HDL 48 02/15/2017   LDLCALC 50 02/15/2017   TRIG 63 02/15/2017   No results found for: [facility]   Radiology Studies    Dg Chest 2 View  Result Date: 02/14/2017 CLINICAL DATA:  Mid chest pain and upper abdominal pain beginning last night. Vomiting and shortness of breath. EXAM: CHEST  2 VIEW COMPARISON:  05/03/2015 FINDINGS: Pacemaker appears unchanged. Previous median sternotomy and CABG. Heart size is stable with left ventricular prominence. Aortic atherosclerosis. The pulmonary vascularity is normal. No infiltrate, collapse or effusion. Old vertebral augmentation. IMPRESSION: No active disease.  Previous CABG.  Pacemaker. Electronically Signed   By: Nelson Chimes M.D.   On: 02/14/2017 17:08   Mr Abdomen Mrcp Wo Contrast  Result Date: 02/18/2017 CLINICAL DATA:  Severe abdominal pain. Nausea and vomiting. Elevated liver function test. Acute hepatitis. EXAM: MRI ABDOMEN WITHOUT CONTRAST  (INCLUDING MRCP) TECHNIQUE: Multiplanar multisequence MR imaging of the abdomen was performed. Heavily T2-weighted images of the biliary and pancreatic ducts were obtained, and three-dimensional MRCP images were rendered by post processing. The patient had severe back pain, and not all sequences on our standard protocol could be performed. FINDINGS: Lower chest: Atelectasis or infiltrate in right posterior lung base. Hepatobiliary: No hepatic masses identified. Diffuse gallbladder wall thickening is seen which is nonspecific and is likely related to acute pancreatitis. At least 1 and possibly 2 calculi are seen in the distal common bile duct measuring up to 5 mm. Mild diffuse biliary  ductal dilatation seen with common bile duct measuring 10 mm. Pancreas: Diffuse pancreatic edema and moderate peripancreatic inflammatory changes are seen, consistent with acute pancreatitis. No definite mass visualized on this unenhanced exam. No evidence of pancreatic ductal dilatation or pancreas divisum. No peripancreatic fluid collections identified. Spleen:  Within normal limits in size and appearance. Adrenals/Urinary Tract: No masses identified. 1.5 cm right  renal cyst noted. No evidence of hydronephrosis. Stomach/Bowel: Visualized portions within the abdomen are unremarkable. Vascular/Lymphatic: No pathologically enlarged lymph nodes identified. No abdominal aortic aneurysm. Other:  None. Musculoskeletal:  No suspicious bone lesions identified. IMPRESSION: Mild diffuse biliary ductal dilatation due to choledocholithiasis. Diffuse gallbladder wall thickening is nonspecific, but likely due to acute pancreatitis. Moderate acute pancreatitis. No evidence of peripancreatic fluid collections. Posterior right lower lobe atelectasis versus infiltrate. Electronically Signed   By: Earle Gell M.D.   On: 02/18/2017 08:13   Mr 3d Recon At Scanner  Result Date: 02/18/2017 CLINICAL DATA:  Severe abdominal pain. Nausea and vomiting. Elevated liver function test. Acute hepatitis. EXAM: MRI ABDOMEN WITHOUT CONTRAST  (INCLUDING MRCP) TECHNIQUE: Multiplanar multisequence MR imaging of the abdomen was performed. Heavily T2-weighted images of the biliary and pancreatic ducts were obtained, and three-dimensional MRCP images were rendered by post processing. The patient had severe back pain, and not all sequences on our standard protocol could be performed. FINDINGS: Lower chest: Atelectasis or infiltrate in right posterior lung base. Hepatobiliary: No hepatic masses identified. Diffuse gallbladder wall thickening is seen which is nonspecific and is likely related to acute pancreatitis. At least 1 and possibly 2 calculi are  seen in the distal common bile duct measuring up to 5 mm. Mild diffuse biliary ductal dilatation seen with common bile duct measuring 10 mm. Pancreas: Diffuse pancreatic edema and moderate peripancreatic inflammatory changes are seen, consistent with acute pancreatitis. No definite mass visualized on this unenhanced exam. No evidence of pancreatic ductal dilatation or pancreas divisum. No peripancreatic fluid collections identified. Spleen:  Within normal limits in size and appearance. Adrenals/Urinary Tract: No masses identified. 1.5 cm right renal cyst noted. No evidence of hydronephrosis. Stomach/Bowel: Visualized portions within the abdomen are unremarkable. Vascular/Lymphatic: No pathologically enlarged lymph nodes identified. No abdominal aortic aneurysm. Other:  None. Musculoskeletal:  No suspicious bone lesions identified. IMPRESSION: Mild diffuse biliary ductal dilatation due to choledocholithiasis. Diffuse gallbladder wall thickening is nonspecific, but likely due to acute pancreatitis. Moderate acute pancreatitis. No evidence of peripancreatic fluid collections. Posterior right lower lobe atelectasis versus infiltrate. Electronically Signed   By: Earle Gell M.D.   On: 02/18/2017 08:13   Dg Ercp Biliary & Pancreatic Ducts  Result Date: 02/19/2017 CLINICAL DATA:  ENDOSCOPIC RETROGRADE CHOLANGIOPANCREATOGRAPHY With SPHINCTEROTOMY for Cystic Bile Duct Stone Removal. EXAM: ERCP TECHNIQUE: Multiple spot images obtained with the fluoroscopic device and submitted for interpretation post-procedure. COMPARISON:  MRCP 02/17/2017 FINDINGS: A series of fluoroscopic spot images document endoscopic catheterization and opacification of the CBD. Images document passage of a balloon tipped catheter through the CB. There is incomplete opacification of the intrahepatic biliary tree, which appears decompressed centrally. IMPRESSION: 1. Endoscopic CBD catheterization and intervention. These images were submitted for  radiologic interpretation only. Please see the procedural report for the amount of contrast and the fluoroscopy time utilized. Electronically Signed   By: Lucrezia Europe M.D.   On: 02/19/2017 15:50   US Abdomen Limited Ruq  Result Date: 02/14/2017 CLINICAL DATA:  Abdominal pain EXAM: ULTRASOUND ABDOMEN LIMITED RIGHT UPPER QUADRANT COMPARISON:  No comparison studies available. FINDINGS: Gallbladder: No gallstones or gallbladder wall thickening. No pericholecystic fluid. The sonographer reports no sonographic Murphy's sign. Common bile duct: Diameter: 4 mm Liver: No focal lesion identified. Within normal limits in parenchymal echogenicity. IMPRESSION: No acute findings.  No evidence for cholelithiasis. Electronically Signed   By: Misty Stanley M.D.   On: 02/14/2017 18:55    ECG & Cardiac Imaging  EKG 02/15/17 and telemetry: intermittent A-paced rhythm  Echocardiogram pending  PPM interrogation ([place] 12/23/16) Presenting Rhythm: AP VS @ 60bpm Percentage RV Pacing: <1% RV Paced Percentage Biventricular Pacing: Not Applicable   No Significant Atrial or Ventricular Arrhythmias  Battery Status Adequate Battery Voltage  Lead Trends Lead Trends Stable   Assessment & Plan    Preoperative cardiac risk evaluation Ms Stump is being evaluated for laparoscopic cholecystectomy. This is a low to moderate risk procedure, higher risk if conversion to open procedure is made. Her PPM was recently interrogated and was functioning properly. She denies recent or current chest pain and shortness of breath (her anginal equivalent). Her sCr is less than 2. She is diabetic and is on a home insulin regimen.   We will obtain an echocardiogram to assess function; there is no echo in EPIC or Care Everywhere.    Given the above risk factors, according to the RCRI she is Class III risk.  She has a 6.6% risk of major cardiac event including cardiac death, nonfatal MI, and nonfatal cardiac arrest. We are  less concerned about risk for heart block given her dual chamber PPM. If echocardiogram is normal, would recommend proceeding with surgery.    Signed, Tami Lin Ellice [place], PA-C 02/19/2017, 4:31 PM [phone]

## 2017-02-19 NOTE — Transfer of Care (Signed)
Immediate Anesthesia Transfer of Care Note  Patient: Daisy Pearson  Procedure(s) Performed: Procedure(s): ENDOSCOPIC RETROGRADE CHOLANGIOPANCREATOGRAPHY (ERCP) (N/A)  Patient Location: Endoscopy Unit  Anesthesia Type:General  Level of Consciousness: awake  Airway & Oxygen Therapy: Patient Spontanous Breathing and Patient connected to nasal cannula oxygen  Post-op Assessment: Report given to RN, Post -op Vital signs reviewed and stable and Patient moving all extremities X 4  Post vital signs: Reviewed and stable  Last Vitals:  Vitals:   02/19/17 1246 02/19/17 1310  BP: (!) 159/54 (!) 196/58  Pulse: 61 66  Resp: 14 (!) 22  Temp: 37 C 36.7 C    Last Pain:  Vitals:   02/19/17 1310  TempSrc: Oral  PainSc: 8       Patients Stated Pain Goal: 2 (02/15/56 5051)  Complications: No apparent anesthesia complications

## 2017-02-19 NOTE — Anesthesia Procedure Notes (Signed)
Procedure Name: Intubation Date/Time: 02/19/2017 2:34 PM Performed by: Gaylene Brooks Pre-anesthesia Checklist: Patient identified, Emergency Drugs available, Suction available and Patient being monitored Patient Re-evaluated:Patient Re-evaluated prior to induction Oxygen Delivery Method: Circle System Utilized Preoxygenation: Pre-oxygenation with 100% oxygen Induction Type: IV induction Ventilation: Mask ventilation without difficulty and Oral airway inserted - appropriate to patient size Laryngoscope Size: Sabra Heck and 2 Grade View: Grade I Tube type: Oral Number of attempts: 1 Airway Equipment and Method: Stylet and Oral airway Placement Confirmation: ETT inserted through vocal cords under direct vision,  positive ETCO2 and breath sounds checked- equal and bilateral Secured at: 22 cm Tube secured with: Tape Dental Injury: Teeth and Oropharynx as per pre-operative assessment

## 2017-02-19 NOTE — Anesthesia Postprocedure Evaluation (Signed)
Anesthesia Post Note  Patient: Daisy Pearson  Procedure(s) Performed: Procedure(s) (LRB): ENDOSCOPIC RETROGRADE CHOLANGIOPANCREATOGRAPHY (ERCP) (N/A)     Patient location during evaluation: Endoscopy Anesthesia Type: General Level of consciousness: awake and alert, patient cooperative and oriented Pain management: pain level controlled Vital Signs Assessment: post-procedure vital signs reviewed and stable Respiratory status: nonlabored ventilation, spontaneous breathing, respiratory function stable and patient connected to nasal cannula oxygen Cardiovascular status: blood pressure returned to baseline and stable Postop Assessment: no signs of nausea or vomiting Anesthetic complications: no    Last Vitals:  Vitals:   02/19/17 1545 02/19/17 1600  BP: (!) 166/61 (!) 185/78  Pulse: 64 63  Resp: 19 (!) 26  Temp: 36.8 C     Last Pain:  Vitals:   02/19/17 1545  TempSrc: Oral  PainSc:                  Earley Grobe,E. Audra Bellard

## 2017-02-19 NOTE — Progress Notes (Signed)
   Subjective: Mrs. Masaki is doing well over the interval. Continues to have epigastric pain and nausea. Under better controlled since switching pain medications to her home medication (Percocet 5/325). Discussed the plan for ERCP today and surgery consult. She is agreeable with the plan. She doesn't have any questions or concerns at this point.   Objective: Vital signs in last 24 hours: Vitals:   02/18/17 0523 02/18/17 1554 02/18/17 1900 02/19/17 0622  BP: (!) 164/71 (!) 166/90 (!) 158/58 (!) 177/66  Pulse: 69 63 67 65  Resp: 16 17 17 18   Temp: 98.4 F (36.9 C) 99 F (37.2 C) 98.9 F (37.2 C) 99.4 F (37.4 C)  TempSrc: Oral Oral Oral Oral  SpO2: 97% 100% 100% 98%  Weight:      Height:       Physical Exam  Constitutional: She is oriented to person, place, and time and well-developed, well-nourished, and in no distress.  HENT:  Head: Normocephalic and atraumatic.  Eyes: Pupils are equal, round, and reactive to light. Conjunctivae are normal.  Cardiovascular: Normal rate, regular rhythm, normal heart sounds and intact distal pulses.   Pulmonary/Chest: Effort normal and breath sounds normal. She has no wheezes. She has no rales.  Abdominal: Soft. Bowel sounds are normal. There is tenderness (Epigastric tenderness much improved.). There is no rebound.  Musculoskeletal: She exhibits no edema.  Neurological: She is alert and oriented to person, place, and time.   Assessment/Plan: Principal Problem:   Acute pancreatitis Active Problems:   Type 2 diabetes mellitus ([place])   Acute kidney injury ([place])   Essential hypertension   A-fib ([place])  1. Acute Pancreatitis  - Epigastric pain improving, able to eat minimally - Normal triglycerides and denies EtOH use  - Hepatitis panel negative - MRCP done mild diffuse ductal dilation due to choledocholithiasis  - INR at 2.09 - GI will take for ERCP today - Will consult surgery to determine need for cholecystectomy   2. Diabetes  Mellitus  - Last A1c 7.7 - Continue sliding scale  - Appreciate Diabetes Coordinator's recommendations of adding 3 units TID, will add once she is able to tolerate diet better   3. HTN  - Will resume Enalapril on discharge  4. Afib  - Rhythm controlled with Amiodarone  - On warfarin at home, currently holding  AKI on CKD. Resolved  Dispo: Anticipated discharge in approximately >1 day(s).   Ina Homes, MD 02/19/2017, 10:28 AM  My Pager: [phone]

## 2017-02-19 NOTE — Progress Notes (Signed)
Physical Therapy Cancellation Note   02/19/17 1326  PT Visit Information  Last PT Received On 02/19/17  Reason Eval/Treat Not Completed Patient at procedure or test/unavailable; PT will check on pt later as time allows.   History of Present Illness Daisy Pearson is a 83 y.o. female with past medical history of paroxysmal atrial fibrillation currently on Coumadin, diabetes, chronic kidney disease, history of coronary artery disease, history of pacemaker placement admitted to the hospital with the abdominal pain nausea and vomiting. She was found to have elevated lipase of 798. Normal ultrasound without any cholelithiasis.   W/u for acute pancreatitis.     Earney Navy, PTA Pager: [phone]

## 2017-02-19 NOTE — Brief Op Note (Signed)
02/14/2017 - 02/19/2017  3:47 PM  PATIENT:  Daisy Pearson  83 y.o. 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 ERCP with sphincterotomy was performed. She had 3 to 4 small  black pigmented stone which were removed with balloon sweep. - Okay to resume Coumadin from tomorrow if no delayed complication or evidence of bleeding - Liquid diet, slowly advance as tolerated - Repeat LFTs in the morning. GI will follow tomorrow    Otis Brace MD, FACP 02/19/2017, 3:48 PM  Pager [phone]  If no answer or after 5 PM call [phone]

## 2017-02-20 ENCOUNTER — Ambulatory Visit: Payer: Self-pay | Admitting: Surgery

## 2017-02-20 ENCOUNTER — Inpatient Hospital Stay (HOSPITAL_COMMUNITY): Payer: Medicare Other

## 2017-02-20 DIAGNOSIS — I340 Nonrheumatic mitral (valve) insufficiency: Secondary | ICD-10-CM

## 2017-02-20 LAB — CBC WITH DIFFERENTIAL/PLATELET
BASOS ABS: 0 10*3/uL (ref 0.0–0.1)
Basophils Relative: 0 %
EOS ABS: 0.2 10*3/uL (ref 0.0–0.7)
Eosinophils Relative: 2 %
HCT: 30.3 % — ABNORMAL LOW (ref 36.0–46.0)
Hemoglobin: 10 g/dL — ABNORMAL LOW (ref 12.0–15.0)
LYMPHS ABS: 0.8 10*3/uL (ref 0.7–4.0)
Lymphocytes Relative: 9 %
MCH: 31.5 pg (ref 26.0–34.0)
MCHC: 33 g/dL (ref 30.0–36.0)
MCV: 95.6 fL (ref 78.0–100.0)
MONO ABS: 0.9 10*3/uL (ref 0.1–1.0)
Monocytes Relative: 10 %
NEUTROS PCT: 79 %
Neutro Abs: 7.1 10*3/uL (ref 1.7–7.7)
PLATELETS: 200 10*3/uL (ref 150–400)
RBC: 3.17 MIL/uL — AB (ref 3.87–5.11)
RDW: 13.1 % (ref 11.5–15.5)
WBC: 9 10*3/uL (ref 4.0–10.5)

## 2017-02-20 LAB — PREPARE FRESH FROZEN PLASMA
UNIT DIVISION: 0
Unit division: 0

## 2017-02-20 LAB — COMPREHENSIVE METABOLIC PANEL
ALT: 146 U/L — AB (ref 14–54)
AST: 114 U/L — AB (ref 15–41)
Albumin: 2.5 g/dL — ABNORMAL LOW (ref 3.5–5.0)
Alkaline Phosphatase: 196 U/L — ABNORMAL HIGH (ref 38–126)
Anion gap: 9 (ref 5–15)
BILIRUBIN TOTAL: 1.4 mg/dL — AB (ref 0.3–1.2)
BUN: 11 mg/dL (ref 6–20)
CO2: 21 mmol/L — ABNORMAL LOW (ref 22–32)
CREATININE: 1.22 mg/dL — AB (ref 0.44–1.00)
Calcium: 7.9 mg/dL — ABNORMAL LOW (ref 8.9–10.3)
Chloride: 106 mmol/L (ref 101–111)
GFR, EST AFRICAN AMERICAN: 47 mL/min — AB (ref >60)
GFR, EST NON AFRICAN AMERICAN: 40 mL/min — AB (ref >60)
Glucose, Bld: 180 mg/dL — ABNORMAL HIGH (ref 65–99)
POTASSIUM: 3.7 mmol/L (ref 3.5–5.1)
Sodium: 136 mmol/L (ref 135–145)
TOTAL PROTEIN: 5.8 g/dL — AB (ref 6.5–8.1)

## 2017-02-20 LAB — BPAM FFP
BLOOD PRODUCT EXPIRATION DATE: 201808012359
BLOOD PRODUCT EXPIRATION DATE: 201808012359
ISSUE DATE / TIME: 201807271056
ISSUE DATE / TIME: 201807271234
UNIT TYPE AND RH: 5100
UNIT TYPE AND RH: 5100

## 2017-02-20 LAB — PROTIME-INR
INR: 1.58
Prothrombin Time: 19 seconds — ABNORMAL HIGH (ref 11.4–15.2)

## 2017-02-20 LAB — SURGICAL PCR SCREEN
MRSA, PCR: NEGATIVE
STAPHYLOCOCCUS AUREUS: NEGATIVE

## 2017-02-20 LAB — ECHOCARDIOGRAM COMPLETE
HEIGHTINCHES: 64 in
WEIGHTICAEL: 3264 [oz_av]

## 2017-02-20 LAB — GLUCOSE, CAPILLARY
GLUCOSE-CAPILLARY: 178 mg/dL — AB (ref 65–99)
GLUCOSE-CAPILLARY: 164 mg/dL — AB (ref 65–99)
GLUCOSE-CAPILLARY: 196 mg/dL — AB (ref 65–99)
Glucose-Capillary: 226 mg/dL — ABNORMAL HIGH (ref 65–99)

## 2017-02-20 LAB — HEPARIN LEVEL (UNFRACTIONATED)

## 2017-02-20 LAB — LIPASE, BLOOD: LIPASE: 23 U/L (ref 11–51)

## 2017-02-20 MED ORDER — CHLORHEXIDINE GLUCONATE 4 % EX LIQD
60.0000 mL | Freq: Once | CUTANEOUS | Status: AC
  Administered 2017-02-22: 4 via TOPICAL

## 2017-02-20 MED ORDER — METAXALONE 800 MG PO TABS
800.0000 mg | ORAL_TABLET | Freq: Three times a day (TID) | ORAL | Status: DC | PRN
  Administered 2017-02-21 – 2017-02-22 (×4): 800 mg via ORAL
  Filled 2017-02-20 (×5): qty 1

## 2017-02-20 MED ORDER — CHLORHEXIDINE GLUCONATE 4 % EX LIQD
60.0000 mL | Freq: Once | CUTANEOUS | Status: AC
  Administered 2017-02-20: 4 via TOPICAL

## 2017-02-20 MED ORDER — INSULIN ASPART 100 UNIT/ML ~~LOC~~ SOLN
0.0000 [IU] | Freq: Three times a day (TID) | SUBCUTANEOUS | Status: DC
Start: 2017-02-20 — End: 2017-02-23
  Administered 2017-02-20 – 2017-02-21 (×3): 3 [IU] via SUBCUTANEOUS
  Administered 2017-02-21: 5 [IU] via SUBCUTANEOUS
  Administered 2017-02-21: 8 [IU] via SUBCUTANEOUS
  Administered 2017-02-22: 11 [IU] via SUBCUTANEOUS
  Administered 2017-02-22: 5 [IU] via SUBCUTANEOUS
  Administered 2017-02-22: 3 [IU] via SUBCUTANEOUS
  Administered 2017-02-23: 15 [IU] via SUBCUTANEOUS
  Administered 2017-02-23: 8 [IU] via SUBCUTANEOUS

## 2017-02-20 MED ORDER — INSULIN ASPART 100 UNIT/ML ~~LOC~~ SOLN
0.0000 [IU] | Freq: Every day | SUBCUTANEOUS | Status: DC
  Administered 2017-02-20 – 2017-02-21 (×2): 2 [IU] via SUBCUTANEOUS
  Administered 2017-02-22: 5 [IU] via SUBCUTANEOUS

## 2017-02-20 MED ORDER — AMLODIPINE BESYLATE 2.5 MG PO TABS
2.5000 mg | ORAL_TABLET | Freq: Every day | ORAL | Status: DC
  Administered 2017-02-20 – 2017-02-21 (×2): 2.5 mg via ORAL
  Filled 2017-02-20 (×2): qty 1

## 2017-02-20 MED ORDER — ACETAMINOPHEN 325 MG PO TABS
650.0000 mg | ORAL_TABLET | Freq: Four times a day (QID) | ORAL | Status: DC | PRN
  Administered 2017-02-20 – 2017-02-22 (×2): 650 mg via ORAL
  Filled 2017-02-20 (×2): qty 2

## 2017-02-20 MED ORDER — AMIODARONE HCL 200 MG PO TABS
200.0000 mg | ORAL_TABLET | Freq: Every day | ORAL | Status: DC
  Administered 2017-02-20 – 2017-02-23 (×4): 200 mg via ORAL
  Filled 2017-02-20 (×4): qty 1

## 2017-02-20 MED ORDER — ENALAPRIL MALEATE 5 MG PO TABS
5.0000 mg | ORAL_TABLET | Freq: Every day | ORAL | Status: DC
  Administered 2017-02-20 – 2017-02-21 (×2): 5 mg via ORAL
  Filled 2017-02-20 (×2): qty 1

## 2017-02-20 MED ORDER — VANCOMYCIN HCL IN DEXTROSE 1-5 GM/200ML-% IV SOLN
1000.0000 mg | INTRAVENOUS | Status: DC
  Filled 2017-02-20: qty 200

## 2017-02-20 MED ORDER — IMPACT ADVANCED RECOVERY PO LIQD: 1.0000 | Freq: Two times a day (BID) | ORAL | 0 refills | Status: AC

## 2017-02-20 MED ORDER — HEPARIN (PORCINE) IN NACL 100-0.45 UNIT/ML-% IJ SOLN
1150.0000 [IU]/h | INTRAMUSCULAR | Status: AC
  Administered 2017-02-20: 1000 [IU]/h via INTRAVENOUS
  Filled 2017-02-20: qty 250

## 2017-02-20 NOTE — Progress Notes (Signed)
1 Day Post-Op   Subjective/Chief Complaint: Feeling well today. Has decided she wants surgery.    Objective: Vital signs in last 24 hours: Temp:  [98 F (36.7 C)-99.6 F (37.6 C)] 99 F (37.2 C) (07/28 0508) Pulse Rate:  [31-66] 61 (07/28 0508) Resp:  [14-26] 20 (07/28 0508) BP: (159-196)/(51-78) 178/70 (07/28 0508) SpO2:  [96 %-100 %] 96 % (07/28 0508) Last BM Date: 02/18/17  Intake/Output from previous day: 07/27 0701 - 07/28 0700 In: 1040 [P.O.:540; I.V.:500] Out: 0  Intake/Output this shift: Total I/O In: 120 [P.O.:120] Out: -   General appearance: alert and cooperative Resp: unlabored respirations GI: soft, non-tender; bowel sounds normal; no masses,  no organomegaly Skin: Skin color, texture, turgor normal. No rashes or lesions  Lab Results:   Recent Labs  02/19/17 0718 02/20/17 0613  WBC 8.9 9.0  HGB 10.2* 10.0*  HCT 31.0* 30.3*  PLT 195 200   BMET  Recent Labs  02/19/17 1658 02/20/17 0613  NA 137 136  K 3.5 3.7  CL 106 106  CO2 20* 21*  GLUCOSE 216* 180*  BUN 13 11  CREATININE 1.28* 1.22*  CALCIUM 8.2* 7.9*   PT/INR  Recent Labs  02/19/17 1658 02/20/17 0613  LABPROT 18.2* 19.0*  INR 1.49 1.58   ABG No results for input(s): PHART, HCO3 in the last 72 hours.  Invalid input(s): PCO2, PO2  Studies/Results: Dg Ercp Biliary & Pancreatic Ducts  Result Date: 02/19/2017 CLINICAL DATA:  ENDOSCOPIC RETROGRADE CHOLANGIOPANCREATOGRAPHY With SPHINCTEROTOMY for Cystic Bile Duct Stone Removal. EXAM: ERCP TECHNIQUE: Multiple spot images obtained with the fluoroscopic device and submitted for interpretation post-procedure. COMPARISON:  MRCP 02/17/2017 FINDINGS: A series of fluoroscopic spot images document endoscopic catheterization and opacification of the CBD. Images document passage of a balloon tipped catheter through the CB. There is incomplete opacification of the intrahepatic biliary tree, which appears decompressed centrally. IMPRESSION: 1.  Endoscopic CBD catheterization and intervention. These images were submitted for radiologic interpretation only. Please see the procedural report for the amount of contrast and the fluoroscopy time utilized. Electronically Signed   By: Lucrezia Europe M.D.   On: 02/19/2017 15:50    Anti-infectives: Anti-infectives    Start     Dose/Rate Route Frequency Ordered Stop   02/19/17 1200  levofloxacin (LEVAQUIN) IVPB 500 mg    Comments:  Pre-ERCP Levaquin . Patient is scheduled for ERCP tomorrow at 1 pm   500 mg 100 mL/hr over 60 Minutes Intravenous  Once 02/18/17 1404 02/19/17 1445   02/18/17 1415  levofloxacin (LEVAQUIN) IVPB 500 mg  Status:  Discontinued    Comments:  Pre-ERCP Levaquin . Patient is scheduled for ERCP tomorrow at 1 pm   500 mg 100 mL/hr over 60 Minutes Intravenous  Once 02/18/17 1404 02/18/17 1404   02/14/17 1900  ciprofloxacin (CIPRO) IVPB 400 mg  Status:  Discontinued     400 mg 200 mL/hr over 60 Minutes Intravenous  Once 02/14/17 1847 02/14/17 1919   02/14/17 1900  metroNIDAZOLE (FLAGYL) IVPB 500 mg  Status:  Discontinued     500 mg 100 mL/hr over 60 Minutes Intravenous  Once 02/14/17 1847 02/14/17 1919      Assessment/Plan: s/p Procedure(s): ENDOSCOPIC RETROGRADE CHOLANGIOPANCREATOGRAPHY (ERCP) (N/A) Possible laparoscopic cholecystectomy tomorrow, pending results of echo today. Hold heparin in AM. NPO midnight.   Her daughter requested [facility] for pt- I gave her a prescription  LOS: 6 days    Daisy Pearson 02/20/2017

## 2017-02-20 NOTE — Progress Notes (Addendum)
ANTICOAGULATION CONSULT NOTE - Initial Consult  Pharmacy Consult for heparin Indication: atrial fibrillation  Allergies  Allergen Reactions  . Esomeprazole Sodium Swelling  . Penicillins Anaphylaxis and Rash    Has patient had a PCN reaction causing immediate rash, facial/tongue/throat swelling, SOB or lightheadedness with hypotension: Yes Has patient had a PCN reaction causing severe rash involving mucus membranes or skin necrosis: No Has patient had a PCN reaction that required hospitalization: No Has patient had a PCN reaction occurring within the last 10 years: No If all of the above answers are "NO", then may proceed with Cephalosporin use.  . Clindamycin/Lincomycin Hives       . Hydrocodone Other (See Comments)    Makes her crazy  . Nsaids Hives and Other (See Comments)    kidney failure  . Valsartan Swelling  . Prilosec [Omeprazole] Swelling and Rash    Patient Measurements: Height: 5\' 4"  (162.6 cm) Weight: 204 lb (92.5 kg) IBW/kg (Calculated) : 54.7 Heparin Dosing Weight: 75.6 kg  Assessment: [name] presents on 7/22 with abdominal pain, nausea, and vomiting. On Coumadin 4mg  daily exc for 2mg  on Sat PTA for Afib. Had large jump in INR on home dose prior to holding. Now to restart heparin s/p OR. GI states ok to restart Coumadin too if no complication. INR down to 1.58. Hgb 10, plts wnl.   Goal of Therapy:  Heparin level 0.3-0.7 units/ml Monitor platelets by anticoagulation protocol: Yes   Plan:  No heparin bolus Start heparin gtt at 1,000 units/hr Check 8 hr heparin level Monitor daily heparin level, CBC, s/s of bleed F/U restart of Coumadin  ADDENDUM:  Patient has decided to go ahead with lap chole tomorrow. Will stop heparin gtt at 0400 on 7/29.  Elenor Quinones, PharmD, BCPS Clinical Pharmacist Pager [phone] 02/20/2017 10:39 AM

## 2017-02-20 NOTE — Progress Notes (Signed)
   Subjective: Patient sitting comfortably in bedside chair, no acute distress. Continues to complain of epigastric abdominal pain and occasional nausea. States her last bowel movement was 2 days ago but she continues to pass flatus. States she does not like clears/ liquid diet and is requesting for her diet to be advanced. Family at bedside requesting home medications skelaxin and tylenol to be restarted.   Objective: Vital signs in last 24 hours: Vitals:   02/19/17 1600 02/19/17 1651 02/19/17 2214 02/20/17 0508  BP: (!) 185/78 (!) 174/57 (!) 178/62 (!) 178/70  Pulse: 63 (!) 31 62 61  Resp: (!) 26 (!) 24 (!) 21 20  Temp:  98.1 F (36.7 C) 99.6 F (37.6 C) 99 F (37.2 C)  TempSrc:  Oral Oral   SpO2: 98% 99% 98% 96%  Weight:      Height:       Physical Exam  Constitutional: She is oriented to person, place, and time and well-developed, well-nourished, and in no distress.  HENT:  Head: Normocephalic and atraumatic.  Eyes: Right eye exhibits no discharge. Left eye exhibits no discharge.  Cardiovascular: Normal rate, regular rhythm and intact distal pulses.   Pulmonary/Chest: Effort normal and breath sounds normal. No respiratory distress. She has no wheezes.  Breathing comfortably on RA Mild bibasilar rales   Abdominal: Soft. Bowel sounds are normal. She exhibits no distension. There is tenderness. There is no rebound and no guarding.  Epigastrium TTP  Musculoskeletal: She exhibits no edema.  Neurological: She is alert and oriented to person, place, and time.  Skin: Skin is warm and dry.   Assessment/Plan: Principal Problem:   Acute pancreatitis Active Problems:   Type 2 diabetes mellitus ([place])   Acute kidney injury ([place])   Essential hypertension   A-fib ([place])  1. Acute Pancreatitis: Underwent ERCP with removal of 3-4 stones on 7/27. Appreciate GI assistance. Surgery following and planning on doing a cholecystectomy during this hospitalization. Continues to complain of  epigastric abdominal pain. ALP and transaminases trending down. No leukocytosis or fevers.  - Advance diet to soft foods today as patient does not want to consume clears/ liquids  - Surgery following, appreciate recs - GI following, appreciate recs  - Echo pending; per cardiology for cardiac clearance for surgery    2. Diabetes Mellitus : Last A1c 7.7 - SSI-M with HS coverage   3. HTN: SBP in 170s-180s, 50s-70s.  - Start Amlodipine 2.5 mg daily - Increase dose of home Enalapril from 2.5 mg daily to 5 mg daily - Continue home metoprolol 25 mg BID  4. Afib  - Resume home Amiodarone 200 mg daily for rhythm control - Continue home metoprolol 25 mg BID for rate control   - On warfarin at home, currently holding as patient will have to go for surgery (cholecystectomy) during this hospitalization. Start heparin gtt for anticoagulation.    5. AKI on CKD. Cr at baseline (1.2).   6. Chronic back pain - Resume home skelaxin 800 mg TID prn - Tylenol 650 mg q6 prn   Dispo: Anticipated discharge in approximately >1 day(s).   Shela Leff, MD 02/20/2017, 10:12 AM  My Pager: [phone]

## 2017-02-20 NOTE — Progress Notes (Signed)
Feeling much better following yesterday's ERCP and stone extraction.  Liver chemistries remain elevated, but are about 30% improved compared to yesterday.  Impression:  1. Benign course, status post ERCP/stone extraction 2. Elevation of liver chemistries, improving 3. Chronic anticoagulation 4. Pancreatitis on admission   Plan:  1. We will sign off; please call us if we can be of further assistance in this patient's care  2. Plans for cholecystectomy tomorrow noted.  3. Patient, and daughters at bedside, advised to have her liver chemistries rechecked as outpatient until normal, or back to baseline. Her primary care internist in [facility] could take care of this.  4. Follow-up with [facility] as desired  Cleotis Nipper, M.D. Pager [phone] If no answer or after 5 PM call [phone]

## 2017-02-20 NOTE — Progress Notes (Signed)
Daisy Pearson for heparin Indication: atrial fibrillation  Allergies  Allergen Reactions  . Esomeprazole Sodium Swelling  . Penicillins Anaphylaxis and Rash    Has patient had a PCN reaction causing immediate rash, facial/tongue/throat swelling, SOB or lightheadedness with hypotension: Yes Has patient had a PCN reaction causing severe rash involving mucus membranes or skin necrosis: No Has patient had a PCN reaction that required hospitalization: No Has patient had a PCN reaction occurring within the last 10 years: No If all of the above answers are "NO", then may proceed with Cephalosporin use.  . Clindamycin/Lincomycin Hives       . Hydrocodone Other (See Comments)    Makes her crazy  . Nsaids Hives and Other (See Comments)    kidney failure  . Valsartan Swelling  . Prilosec [Omeprazole] Swelling and Rash    Patient Measurements: Height: 5\' 4"  (162.6 cm) Weight: 204 lb (92.5 kg) IBW/kg (Calculated) : 54.7 Heparin Dosing Weight: 75.6 kg  Daisy Pearson presents on 7/22 with abdominal pain, nausea, and vomiting. PTA she is on Coumadin 4mg  daily except for 2mg  on Sat for Afib. Had large jump in INR on home dose prior to holding. Now to restart heparin s/p OR. Going for lap chole in the morning.   Initial heparin level undetectable. No issues with line per RN. No bleeding noted.  Goal of Therapy:  Heparin level 0.3-0.7 units/ml Monitor platelets by anticoagulation protocol: Yes   Plan:  Increase heparin to 1150 units/hr Heparin to stop 7/29 @ 0400 in preparation for OR Monitor daily heparin level, CBC, s/s of bleed Pearson/U restart of Coumadin post-op  Daisy Pearson Daisy Pearson, PharmD, BCPS Clinical Pharmacist 02/20/2017 8:27 PM

## 2017-02-20 NOTE — Progress Notes (Signed)
   Progress Note  Patient Name: Daisy Pearson Date of Encounter: 02/20/2017  Primary Cardiologist: [facility] - Milana Huntsman MD   Please see full cardiology consultation note with recommendations per Dr. Ellyn Hack from yesterday. We await follow-up echocardiogram results. She continues on amiodarone, Norvasc, enalapril, and Lopressor. She is hemodynamically stable although hypertensive. She is tentatively scheduled for laparoscopic cholecystectomy tomorrow.  Signed, Rozann Lesches, MD  02/20/2017, 12:08 PM

## 2017-02-20 NOTE — Progress Notes (Signed)
  Echocardiogram 2D Echocardiogram has been performed.  Donata Clay 02/20/2017, 3:27 PM

## 2017-02-20 NOTE — Progress Notes (Signed)
  Date: 02/20/2017  Patient name: Daisy Pearson  Medical record number: [id number]  Date of birth: 07-Jan-1934   This patient's plan of care was discussed with the house staff. Please see their note for complete details. I concur with their findings.  I have also reviewed specialists notes, plan for cholecystectomy tomorrow.    Sid Falcon, MD 02/20/2017, 8:42 PM

## 2017-02-21 DIAGNOSIS — N179 Acute kidney failure, unspecified: Secondary | ICD-10-CM

## 2017-02-21 LAB — CBC
HCT: 31.1 % — ABNORMAL LOW (ref 36.0–46.0)
Hemoglobin: 10.3 g/dL — ABNORMAL LOW (ref 12.0–15.0)
MCH: 31.2 pg (ref 26.0–34.0)
MCHC: 33.1 g/dL (ref 30.0–36.0)
MCV: 94.2 fL (ref 78.0–100.0)
Platelets: 221 10*3/uL (ref 150–400)
RBC: 3.3 MIL/uL — ABNORMAL LOW (ref 3.87–5.11)
RDW: 13.1 % (ref 11.5–15.5)
WBC: 6.4 10*3/uL (ref 4.0–10.5)

## 2017-02-21 LAB — PROTIME-INR
INR: 1.62
Prothrombin Time: 19.4 seconds — ABNORMAL HIGH (ref 11.4–15.2)

## 2017-02-21 LAB — GLUCOSE, CAPILLARY
GLUCOSE-CAPILLARY: 199 mg/dL — AB (ref 65–99)
GLUCOSE-CAPILLARY: 240 mg/dL — AB (ref 65–99)
Glucose-Capillary: 217 mg/dL — ABNORMAL HIGH (ref 65–99)
Glucose-Capillary: 264 mg/dL — ABNORMAL HIGH (ref 65–99)

## 2017-02-21 LAB — HEPARIN LEVEL (UNFRACTIONATED): HEPARIN UNFRACTIONATED: 0.28 [IU]/mL — AB (ref 0.30–0.70)

## 2017-02-21 MED ORDER — HEPARIN (PORCINE) IN NACL 100-0.45 UNIT/ML-% IJ SOLN
1250.0000 [IU]/h | INTRAMUSCULAR | Status: AC
  Administered 2017-02-21 (×2): 1150 [IU]/h via INTRAVENOUS
  Filled 2017-02-21 (×2): qty 250

## 2017-02-21 MED ORDER — CIPROFLOXACIN IN D5W 400 MG/200ML IV SOLN
400.0000 mg | INTRAVENOUS | Status: AC
  Administered 2017-02-22: 400 mg via INTRAVENOUS
  Filled 2017-02-21: qty 200

## 2017-02-21 MED ORDER — AMLODIPINE BESYLATE 5 MG PO TABS
5.0000 mg | ORAL_TABLET | Freq: Every day | ORAL | Status: DC
  Administered 2017-02-22 – 2017-02-23 (×2): 5 mg via ORAL
  Filled 2017-02-21 (×2): qty 1

## 2017-02-21 MED ORDER — ENALAPRIL MALEATE 5 MG PO TABS
5.0000 mg | ORAL_TABLET | Freq: Once | ORAL | Status: AC
  Administered 2017-02-21: 5 mg via ORAL
  Filled 2017-02-21: qty 1

## 2017-02-21 MED ORDER — SENNOSIDES-DOCUSATE SODIUM 8.6-50 MG PO TABS
2.0000 | ORAL_TABLET | Freq: Two times a day (BID) | ORAL | Status: DC
  Administered 2017-02-21 – 2017-02-23 (×2): 2 via ORAL
  Filled 2017-02-21 (×3): qty 2

## 2017-02-21 MED ORDER — POLYETHYLENE GLYCOL 3350 17 G PO PACK
17.0000 g | PACK | Freq: Every day | ORAL | Status: DC
  Administered 2017-02-21 – 2017-02-23 (×2): 17 g via ORAL
  Filled 2017-02-21 (×2): qty 1

## 2017-02-21 MED ORDER — INSULIN DETEMIR 100 UNIT/ML ~~LOC~~ SOLN
10.0000 [IU] | Freq: Once | SUBCUTANEOUS | Status: AC
  Administered 2017-02-21: 10 [IU] via SUBCUTANEOUS
  Filled 2017-02-21: qty 0.1

## 2017-02-21 MED ORDER — ENALAPRIL MALEATE 10 MG PO TABS
10.0000 mg | ORAL_TABLET | Freq: Every day | ORAL | Status: DC
  Administered 2017-02-23: 10 mg via ORAL
  Filled 2017-02-21 (×2): qty 1

## 2017-02-21 MED ORDER — AMLODIPINE BESYLATE 2.5 MG PO TABS
2.5000 mg | ORAL_TABLET | Freq: Once | ORAL | Status: AC
  Administered 2017-02-21: 2.5 mg via ORAL
  Filled 2017-02-21: qty 1

## 2017-02-21 NOTE — Progress Notes (Signed)
    Progress Note  Patient Name: Daisy Pearson Date of Encounter: 02/20/2017  Primary Cardiologist: [facility] - Milana Huntsman MD   Please see full cardiology consultation note with recommendations per Dr. Ellyn Hack from 7/27. She continues on amiodarone, Norvasc, enalapril, and Lopressor. She is scheduled for laparoscopic cholecystectomy tomorrow.  Transthoracic Echocardiogram (7/28) Study Conclusions  - Left ventricle: The cavity size was normal. Wall thickness was   increased in a pattern of mild LVH. Systolic function was normal.   The estimated ejection fraction was in the range of 60% to 65%.   Features are consistent with a pseudonormal left ventricular   filling pattern, with concomitant abnormal relaxation and   increased filling pressure (grade 2 diastolic dysfunction). - Mitral valve: There was mild regurgitation. - Right atrium: The atrium was mildly dilated. - Tricuspid valve: There was mild-moderate regurgitation. Aortic valve was mildly thickened, mildly calcified leaflets. No regurg or stenosis noted.  No further cardiac testing planned with LVEF normal by echocardiogram. Continue current cardiac medications. If unable to take oral medications for extended period of time post op then may need to convert to IV Lopressor and potentially even Amiodarone. Increase Norvasc to 5 mg daily.  Signed, Delos Haring, PA-C  02/21/2017 9:32 AM

## 2017-02-21 NOTE — Progress Notes (Signed)
ANTICOAGULATION CONSULT NOTE - Follow Up Consult  Pharmacy Consult for heparin Indication: atrial fibrillation   Labs:  Recent Labs  02/19/17 0718 02/19/17 1658 02/20/17 0613 02/20/17 1911 02/21/17 0523  HGB 10.2*  --  10.0*  --  10.3*  HCT 31.0*  --  30.3*  --  31.1*  PLT 195  --  200  --  221  LABPROT  --  18.2* 19.0*  --  19.4*  INR  --  1.49 1.58  --  1.62  HEPARINUNFRC  --   --   --  <0.10*  --   CREATININE 1.32* 1.28* 1.22*  --   --     Assessment: 83yo female had heparin stop this am for surgery which has been postponed until tomorrow, to resume heparin for now.  Goal of Therapy:  Heparin level 0.3-0.7 units/ml Monitor platelets by anticoagulation protocol: Yes   Plan:  Will resume heparin gtt at 1150 units/hr and check level in [place], PharmD, BCPS  02/21/2017,7:47 AM

## 2017-02-21 NOTE — Progress Notes (Signed)
[facility] for heparin Indication: atrial fibrillation  Allergies  Allergen Reactions  . Esomeprazole Sodium Swelling  . Penicillins Anaphylaxis and Rash    Has patient had a PCN reaction causing immediate rash, facial/tongue/throat swelling, SOB or lightheadedness with hypotension: Yes Has patient had a PCN reaction causing severe rash involving mucus membranes or skin necrosis: No Has patient had a PCN reaction that required hospitalization: No Has patient had a PCN reaction occurring within the last 10 years: No If all of the above answers are "NO", then may proceed with Cephalosporin use.  . Clindamycin/Lincomycin Hives       . Hydrocodone Other (See Comments)    Makes her crazy  . Nsaids Hives and Other (See Comments)    kidney failure  . Valsartan Swelling  . Prilosec [Omeprazole] Swelling and Rash    Patient Measurements: Height: 5\' 4"  (162.6 cm) Weight: 204 lb (92.5 kg) IBW/kg (Calculated) : 54.7 Heparin Dosing Weight: 75.6 kg  Assessment: [name] presents on 7/22 with abdominal pain, nausea, and vomiting. PTA she is on Coumadin 4mg  daily except for 2mg  on Sat for Afib. Had large jump in INR on home dose prior to holding. Now to restart heparin s/p OR. Going for lap chole in on 7/30.  -Heparin level up to 0.28 on 1150 units/hr  Goal of Therapy:  Heparin level 0.3-0.7 units/ml Monitor platelets by anticoagulation protocol: Yes   Plan: -Increase heparin to 1250 units/hr -heparin level and CBC in am  Hildred Laser, Pharm D 02/21/2017 5:32 PM

## 2017-02-21 NOTE — Anesthesia Preprocedure Evaluation (Addendum)
Anesthesia Evaluation  Patient identified by MRN, date of birth, ID band  Reviewed: Allergy & Precautions, NPO status , Patient's Chart, lab work & pertinent test results  Airway Mallampati: III  TM Distance: >3 FB Neck ROM: Full    Dental no notable dental hx. (+) Edentulous Upper, Edentulous Lower   Pulmonary neg pulmonary ROS,    Pulmonary exam normal breath sounds clear to auscultation       Cardiovascular hypertension, + CAD  negative cardio ROS Normal cardiovascular exam+ dysrhythmias Atrial Fibrillation + pacemaker  Rhythm:Regular Rate:Normal     Neuro/Psych negative neurological ROS     GI/Hepatic negative GI ROS, Neg liver ROS,   Endo/Other  negative endocrine ROSdiabetes, Type 2  Renal/GU negative Renal ROS     Musculoskeletal negative musculoskeletal ROS (+) Arthritis , Osteoarthritis,  Fibromyalgia -  Abdominal   Peds  Hematology negative hematology ROS (+)   Anesthesia Other Findings - Left ventricle: The cavity size was normal. Wall thickness was   increased in a pattern of mild LVH. Systolic function was normal.   The estimated ejection fraction was in the range of 60% to 65%.   Features are consistent with a pseudonormal left ventricular   filling pattern, with concomitant abnormal relaxation and   increased filling pressure (grade 2 diastolic dysfunction).  Reproductive/Obstetrics                          Anesthesia Physical  Anesthesia Plan  ASA: III  Anesthesia Plan: General   Post-op Pain Management:    Induction: Intravenous  PONV Risk Score and Plan: 2 and Ondansetron, Dexamethasone and Treatment may vary due to age or medical condition  Airway Management Planned: Oral ETT  Additional Equipment:   Intra-op Plan:   Post-operative Plan: Extubation in OR  Informed Consent: I have reviewed the patients History and Physical, chart, labs and discussed the  procedure including the risks, benefits and alternatives for the proposed anesthesia with the patient or authorized representative who has indicated his/her understanding and acceptance.   Dental advisory given  Plan Discussed with:   Anesthesia Plan Comments: (  )        Anesthesia Quick Evaluation                                   Anesthesia Evaluation    Airway        Dental   Pulmonary           Cardiovascular hypertension, + dysrhythmias Atrial Fibrillation      Neuro/Psych    GI/Hepatic   Endo/Other  diabetes, Type 2  Renal/GU      Musculoskeletal  (+) Arthritis , Osteoarthritis,  Fibromyalgia -  Abdominal   Peds  Hematology   Anesthesia Other Findings   Reproductive/Obstetrics                             Anesthesia Physical Anesthesia Plan Anesthesia Quick Evaluation                                   Anesthesia Evaluation    Reviewed: Allergy & Precautions, Patient's Chart, lab work & pertinent test results  Airway        Dental   Pulmonary neg pulmonary ROS,  Cardiovascular hypertension, negative cardio ROS  + dysrhythmias Atrial Fibrillation      Neuro/Psych negative neurological ROS     GI/Hepatic negative GI ROS, Neg liver ROS,   Endo/Other  negative endocrine ROSdiabetes, Type 2  Renal/GU negative Renal ROS     Musculoskeletal negative musculoskeletal ROS (+) Arthritis , Osteoarthritis,  Fibromyalgia -  Abdominal   Peds  Hematology negative hematology ROS (+)   Anesthesia Other Findings Day of surgery medications reviewed with the patient.  Reproductive/Obstetrics                            Anesthesia Physical Anesthesia Plan Anesthesia Quick Evaluation                                   Anesthesia Evaluation    Airway        Dental   Pulmonary           Cardiovascular hypertension, + dysrhythmias Atrial  Fibrillation      Neuro/Psych    GI/Hepatic   Endo/Other  diabetes, Type 2  Renal/GU      Musculoskeletal  (+) Arthritis , Osteoarthritis,  Fibromyalgia -  Abdominal   Peds  Hematology   Anesthesia Other Findings   Reproductive/Obstetrics                             Anesthesia Physical Anesthesia Plan Anesthesia Quick Evaluation

## 2017-02-21 NOTE — Progress Notes (Signed)
2 Days Post-Op    CC:  Gallstone pancreatitis  Subjective: She is OK this AM, still a little tender on exam RUQ.  Otherwise doing well.   Objective: Vital signs in last 24 hours: Temp:  [97.6 F (36.4 C)-99.2 F (37.3 C)] 97.6 F (36.4 C) (07/29 1610) Pulse Rate:  [60-62] 62 (07/29 0608) Resp:  [17-18] 18 (07/29 0608) BP: (159-176)/(58-92) 176/58 (07/29 0608) SpO2:  [98 %-100 %] 99 % (07/29 0608) Last BM Date: 02/18/17 411 PO recorded 148 IV recorded 200 urine recorded Afebrile, VSS CBC is fine this AM No films -  Intake/Output from previous day: 07/28 0701 - 07/29 0700 In: 559.8 [P.O.:411; I.V.:148.8] Out: 200 [Urine:200] Intake/Output this shift: No intake/output data recorded.  General appearance: alert, cooperative and no distress Resp: clear to auscultation bilaterally GI: soft, still a little tender over the RUQ on exam.    Lab Results:   Recent Labs  02/20/17 0613 02/21/17 0523  WBC 9.0 6.4  HGB 10.0* 10.3*  HCT 30.3* 31.1*  PLT 200 221    BMET  Recent Labs  02/19/17 1658 02/20/17 0613  NA 137 136  K 3.5 3.7  CL 106 106  CO2 20* 21*  GLUCOSE 216* 180*  BUN 13 11  CREATININE 1.28* 1.22*  CALCIUM 8.2* 7.9*   PT/INR  Recent Labs  02/20/17 0613 02/21/17 0523  LABPROT 19.0* 19.4*  INR 1.58 1.62     Recent Labs Lab 02/16/17 0643 02/17/17 0432 02/18/17 0450 02/19/17 1658 02/20/17 0613  AST 88* 42* 25 212* 114*  ALT 175* 120* 77* 187* 146*  ALKPHOS 102 98 98 229* 196*  BILITOT 1.2 0.8 1.1 2.1* 1.4*  PROT 5.8* 5.6* 5.9* 6.1* 5.8*  ALBUMIN 2.9* 2.8* 2.7* 2.8* 2.5*     Lipase     Component Value Date/Time   LIPASE 23 02/20/2017 0613     Medications: . amiodarone  200 mg Oral Daily  . amLODipine  2.5 mg Oral Daily  . chlorhexidine  60 mL Topical Once  . enalapril  5 mg Oral Daily  . famotidine  20 mg Oral Daily  . insulin aspart  0-15 Units Subcutaneous TID WC  . insulin aspart  0-5 Units Subcutaneous QHS  . metoprolol  tartrate  25 mg Oral BID   . sodium chloride    . vancomycin     Anti-infectives    Start     Dose/Rate Route Frequency Ordered Stop   02/21/17 0600  vancomycin (VANCOCIN) IVPB 1000 mg/200 mL premix     1,000 mg 200 mL/hr over 60 Minutes Intravenous To Short Stay 02/20/17 1911 02/22/17 0600   02/19/17 1200  levofloxacin (LEVAQUIN) IVPB 500 mg    Comments:  Pre-ERCP Levaquin . Patient is scheduled for ERCP tomorrow at 1 pm   500 mg 100 mL/hr over 60 Minutes Intravenous  Once 02/18/17 1404 02/19/17 1445   02/18/17 1415  levofloxacin (LEVAQUIN) IVPB 500 mg  Status:  Discontinued    Comments:  Pre-ERCP Levaquin . Patient is scheduled for ERCP tomorrow at 1 pm   500 mg 100 mL/hr over 60 Minutes Intravenous  Once 02/18/17 1404 02/18/17 1404   02/14/17 1900  ciprofloxacin (CIPRO) IVPB 400 mg  Status:  Discontinued     400 mg 200 mL/hr over 60 Minutes Intravenous  Once 02/14/17 1847 02/14/17 1919   02/14/17 1900  metroNIDAZOLE (FLAGYL) IVPB 500 mg  Status:  Discontinued     500 mg 100 mL/hr over 60 Minutes Intravenous  Once 02/14/17 1847 02/14/17 1919       Assessment/Plan Gallstone pancreatitis//choledocholithiasis by MRCP. Scheduled for ERCP 02/19/17 CAD, status post CABG Chronic atrial fibrillation on Coumadin Permanent transvenous pacemaker Type 2 diabetes Hypertension Hyperlipidemia Fibromyalgia with chronic pain  -  Percocet/60 per month. Chronic kidney disease Body mass index is 35 FEN:  IV fluids/NPO ID: Cipro/Flagyl 1 dose 02/14/17;  Levaquin preop ERCP DVT: Heparin on hold    Plan:  We are not able to do surgery today.  We have discussed with patient and her daughter.  Will place back on diet and Heparin.  Aim for surgery tomorrow.  She is post for Dr. Rosendo Gros.     LOS: 7 days    Sequoyah Counterman 02/21/2017 [phone]

## 2017-02-21 NOTE — Progress Notes (Signed)
  Date: 02/21/2017  Patient name: Daisy Pearson  Medical record number: [id number]  Date of birth: [birth year]   I have seen and evaluated this patient and I have discussed the plan of care with the house staff. Please Dr. Elon Jester upcomign notefor complete details. I concur with their findings and discussed the plan in detail with the resident team.  Cholecystecomy appears to now be scheduled for Monday per review of surgery notes.    Sid Falcon, MD 02/21/2017, 2:53 PM

## 2017-02-21 NOTE — Progress Notes (Signed)
   Subjective: Patient lying comfortably in bed, no acute distress. Complaining of epigastric abdominal pain/ abdominal distention. Denies having any nausea or vomiting. States she is able to tolerate PO intake. States her last bowel movement was 3 days ago but she continues to pass flatus.   Objective: Vital signs in last 24 hours: Vitals:   02/20/17 0508 02/20/17 1722 02/20/17 2134 02/21/17 0608  BP: (!) 178/70 (!) 159/62 (!) 168/92 (!) 176/58  Pulse: 61 61 60 62  Resp: 20 17 18 18   Temp: 99 F (37.2 C) 98.1 F (36.7 C) 99.2 F (37.3 C) 97.6 F (36.4 C)  TempSrc:  Oral Oral Oral  SpO2: 96% 100% 98% 99%  Weight:      Height:       Physical Exam  Constitutional: She is oriented to person, place, and time and well-developed, well-nourished, and in no distress.  HENT:  Head: Normocephalic and atraumatic.  Eyes: Right eye exhibits no discharge. Left eye exhibits no discharge.  Cardiovascular: Normal rate, regular rhythm and intact distal pulses.   Pulmonary/Chest: Effort normal and breath sounds normal. No respiratory distress. She has no wheezes. She has no rales.  Abdominal: Soft. Bowel sounds are normal. She exhibits no distension. There is no tenderness. There is no rebound and no guarding.  Musculoskeletal: She exhibits no edema.  Neurological: She is alert and oriented to person, place, and time.  Skin: Skin is warm and dry.   Assessment/Plan: Principal Problem:   Acute pancreatitis Active Problems:   Type 2 diabetes mellitus ([place])   Acute kidney injury ([place])   Essential hypertension   A-fib ([place])  1. Acute Pancreatitis: Underwent ERCP with removal of 3-4 stones on 7/27. Appreciate GI assistance. Surgery following and planning on doing a cholecystectomy on 7/30. Continues to complain of epigastric abdominal pain/ abdominal distention. Abdomen soft and non-TTP. ALP and transaminases were trending down. No leukocytosis or fever.  - Continue soft foods  - Surgery following,  appreciate recs  - Cardiology clearance for surgery complete; no further testing planned     2. Diabetes Mellitus : Last A1c 7.7. CBG in 200s.  - SSI-M with HS coverage - Home Levemir at a lower dose: 10 u once today. Will resume her usual dose after surgery when she is able to tolerate PO intake again.    3. HTN: SBP in 150s-170s, 50s-90s.  - Increase dose of Amlodipine to 5 mg daily - Increase dose of Enalapril to 10 mg daily - Continue metoprolol 25 mg BID  4. Afib  - Continue Amiodarone 200 mg daily for rhythm control - Continue metoprolol 25 mg BID for rate control   - On warfarin at home, currently holding as patient will have to go for surgery (cholecystectomy) on 7/30. Currently on heparin gtt for anticoagulation.    5. AKI on CKD. Resolved. Cr at baseline (1.2).   6. Chronic back pain - Resume home skelaxin 800 mg TID prn - Tylenol 650 mg q6 prn   7. Constipation: No bowel movement in 3 days per patient. Abdomen soft and non-TTP. Passing flatus.  -Miralax daily -Senokot-S 2 tabs BID  Dispo: Anticipated discharge in approximately >1 day(s).   Shela Leff, MD 02/21/2017, 3:07 PM  My Pager: [phone]

## 2017-02-22 ENCOUNTER — Encounter (HOSPITAL_COMMUNITY): Payer: Self-pay | Admitting: Orthopedic Surgery

## 2017-02-22 ENCOUNTER — Inpatient Hospital Stay (HOSPITAL_COMMUNITY): Payer: Medicare Other | Admitting: Anesthesiology

## 2017-02-22 ENCOUNTER — Inpatient Hospital Stay (HOSPITAL_COMMUNITY): Payer: Medicare Other

## 2017-02-22 ENCOUNTER — Encounter (HOSPITAL_COMMUNITY)
Admission: EM | Disposition: A | Discharge status: Home / Self Care | Payer: Self-pay | Source: Home / Self Care | Attending: Internal Medicine

## 2017-02-22 DIAGNOSIS — K859 Acute pancreatitis without necrosis or infection, unspecified: Secondary | ICD-10-CM

## 2017-02-22 HISTORY — PX: CHOLECYSTECTOMY: SHX55

## 2017-02-22 LAB — GLUCOSE, CAPILLARY
GLUCOSE-CAPILLARY: 203 mg/dL — AB (ref 65–99)
GLUCOSE-CAPILLARY: 409 mg/dL — AB (ref 65–99)
Glucose-Capillary: 166 mg/dL — ABNORMAL HIGH (ref 65–99)
Glucose-Capillary: 183 mg/dL — ABNORMAL HIGH (ref 65–99)
Glucose-Capillary: 301 mg/dL — ABNORMAL HIGH (ref 65–99)

## 2017-02-22 LAB — PROTIME-INR
INR: 1.59
PROTHROMBIN TIME: 19.1 s — AB (ref 11.4–15.2)

## 2017-02-22 LAB — CBC
HCT: 31 % — ABNORMAL LOW (ref 36.0–46.0)
Hemoglobin: 10.3 g/dL — ABNORMAL LOW (ref 12.0–15.0)
MCH: 31.1 pg (ref 26.0–34.0)
MCHC: 33.2 g/dL (ref 30.0–36.0)
MCV: 93.7 fL (ref 78.0–100.0)
PLATELETS: 244 10*3/uL (ref 150–400)
RBC: 3.31 MIL/uL — ABNORMAL LOW (ref 3.87–5.11)
RDW: 13.2 % (ref 11.5–15.5)
WBC: 5.1 10*3/uL (ref 4.0–10.5)

## 2017-02-22 LAB — HEPARIN LEVEL (UNFRACTIONATED): Heparin Unfractionated: 0.13 IU/mL — ABNORMAL LOW (ref 0.30–0.70)

## 2017-02-22 SURGERY — LAPAROSCOPIC CHOLECYSTECTOMY WITH INTRAOPERATIVE CHOLANGIOGRAM
Anesthesia: General | Site: Abdomen

## 2017-02-22 MED ORDER — SODIUM CHLORIDE 0.9 % IR SOLN
Status: DC | PRN
  Administered 2017-02-22: 1

## 2017-02-22 MED ORDER — LIDOCAINE HCL (CARDIAC) 20 MG/ML IV SOLN
INTRAVENOUS | Status: DC | PRN
  Administered 2017-02-22: 100 mg via INTRAVENOUS

## 2017-02-22 MED ORDER — BUPIVACAINE HCL (PF) 0.25 % IJ SOLN
INTRAMUSCULAR | Status: AC
  Filled 2017-02-22: qty 30

## 2017-02-22 MED ORDER — DEXTROSE-NACL 5-0.9 % IV SOLN
INTRAVENOUS | Status: DC
  Administered 2017-02-22: 13:00:00 via INTRAVENOUS

## 2017-02-22 MED ORDER — FENTANYL CITRATE (PF) 100 MCG/2ML IJ SOLN
INTRAMUSCULAR | Status: DC | PRN
  Administered 2017-02-22: 50 ug via INTRAVENOUS

## 2017-02-22 MED ORDER — IOPAMIDOL (ISOVUE-300) INJECTION 61%
INTRAVENOUS | Status: DC | PRN
  Administered 2017-02-22: 7 mL

## 2017-02-22 MED ORDER — FENTANYL CITRATE (PF) 100 MCG/2ML IJ SOLN
25.0000 ug | INTRAMUSCULAR | Status: DC | PRN
  Administered 2017-02-22: 25 ug via INTRAVENOUS

## 2017-02-22 MED ORDER — LACTATED RINGERS IV SOLN
INTRAVENOUS | Status: DC
  Administered 2017-02-22: 09:00:00 via INTRAVENOUS

## 2017-02-22 MED ORDER — SUGAMMADEX SODIUM 200 MG/2ML IV SOLN
INTRAVENOUS | Status: DC | PRN
  Administered 2017-02-22: 200 mg via INTRAVENOUS

## 2017-02-22 MED ORDER — BUPIVACAINE HCL 0.25 % IJ SOLN
INTRAMUSCULAR | Status: DC | PRN
  Administered 2017-02-22: 9 mL

## 2017-02-22 MED ORDER — PROPOFOL 10 MG/ML IV BOLUS
INTRAVENOUS | Status: AC
  Filled 2017-02-22: qty 20

## 2017-02-22 MED ORDER — SUGAMMADEX SODIUM 200 MG/2ML IV SOLN
INTRAVENOUS | Status: AC
  Filled 2017-02-22: qty 2

## 2017-02-22 MED ORDER — CALCIUM CHLORIDE 10 % IV SOLN
INTRAVENOUS | Status: AC
  Filled 2017-02-22: qty 20

## 2017-02-22 MED ORDER — IOPAMIDOL (ISOVUE-300) INJECTION 61%
INTRAVENOUS | Status: AC
  Filled 2017-02-22: qty 50

## 2017-02-22 MED ORDER — ONDANSETRON HCL 4 MG/2ML IJ SOLN
INTRAMUSCULAR | Status: AC
  Filled 2017-02-22: qty 2

## 2017-02-22 MED ORDER — ONDANSETRON HCL 4 MG/2ML IJ SOLN: 4.0000 mg | Freq: Four times a day (QID) | INTRAMUSCULAR | Status: DC | PRN

## 2017-02-22 MED ORDER — ONDANSETRON HCL 4 MG/2ML IJ SOLN
INTRAMUSCULAR | Status: DC | PRN
  Administered 2017-02-22: 4 mg via INTRAVENOUS

## 2017-02-22 MED ORDER — PROPOFOL 10 MG/ML IV BOLUS
INTRAVENOUS | Status: DC | PRN
  Administered 2017-02-22: 100 mg via INTRAVENOUS

## 2017-02-22 MED ORDER — ONDANSETRON 4 MG PO TBDP: 4.0000 mg | ORAL_TABLET | Freq: Four times a day (QID) | ORAL | Status: DC | PRN

## 2017-02-22 MED ORDER — 0.9 % SODIUM CHLORIDE (POUR BTL) OPTIME
TOPICAL | Status: DC | PRN
  Administered 2017-02-22: 1000 mL

## 2017-02-22 MED ORDER — DEXAMETHASONE SODIUM PHOSPHATE 10 MG/ML IJ SOLN
INTRAMUSCULAR | Status: DC | PRN
  Administered 2017-02-22: 10 mg via INTRAVENOUS

## 2017-02-22 MED ORDER — MEPERIDINE HCL 25 MG/ML IJ SOLN: 6.2500 mg | INTRAMUSCULAR | Status: DC | PRN

## 2017-02-22 MED ORDER — LIDOCAINE 2% (20 MG/ML) 5 ML SYRINGE
INTRAMUSCULAR | Status: AC
  Filled 2017-02-22: qty 5

## 2017-02-22 MED ORDER — DEXAMETHASONE SODIUM PHOSPHATE 10 MG/ML IJ SOLN
INTRAMUSCULAR | Status: AC
  Filled 2017-02-22: qty 1

## 2017-02-22 MED ORDER — FENTANYL CITRATE (PF) 250 MCG/5ML IJ SOLN
INTRAMUSCULAR | Status: AC
  Filled 2017-02-22: qty 5

## 2017-02-22 MED ORDER — FENTANYL CITRATE (PF) 100 MCG/2ML IJ SOLN
INTRAMUSCULAR | Status: AC
  Filled 2017-02-22: qty 2

## 2017-02-22 MED ORDER — ROCURONIUM BROMIDE 100 MG/10ML IV SOLN
INTRAVENOUS | Status: DC | PRN
  Administered 2017-02-22: 40 mg via INTRAVENOUS

## 2017-02-22 MED ORDER — OXYCODONE HCL 5 MG PO TABS
5.0000 mg | ORAL_TABLET | ORAL | Status: DC | PRN
  Administered 2017-02-22 – 2017-02-23 (×5): 10 mg via ORAL
  Filled 2017-02-22 (×5): qty 2

## 2017-02-22 SURGICAL SUPPLY — 47 items
APPLIER CLIP 5 13 M/L LIGAMAX5 (MISCELLANEOUS) ×3
BENZOIN TINCTURE PRP APPL 2/3 (GAUZE/BANDAGES/DRESSINGS) ×3 IMPLANT
CANISTER SUCT 3000ML PPV (MISCELLANEOUS) ×3 IMPLANT
CHLORAPREP W/TINT 26ML (MISCELLANEOUS) ×3 IMPLANT
CLIP APPLIE 5 13 M/L LIGAMAX5 (MISCELLANEOUS) ×1 IMPLANT
CLIP VESOLOCK MED LG 6/CT (CLIP) ×3 IMPLANT
CLOSURE WOUND 1/2 X4 (GAUZE/BANDAGES/DRESSINGS) ×1
COVER MAYO STAND STRL (DRAPES) ×3 IMPLANT
COVER SURGICAL LIGHT HANDLE (MISCELLANEOUS) ×3 IMPLANT
COVER TRANSDUCER ULTRASND (DRAPES) ×3 IMPLANT
DRAPE C-ARM 42X72 X-RAY (DRAPES) ×3 IMPLANT
ELECT REM PT RETURN 9FT ADLT (ELECTROSURGICAL) ×3
ELECTRODE REM PT RTRN 9FT ADLT (ELECTROSURGICAL) ×1 IMPLANT
GAUZE SPONGE 2X2 8PLY STRL LF (GAUZE/BANDAGES/DRESSINGS) ×1 IMPLANT
GLOVE BIO SURGEON STRL SZ7.5 (GLOVE) ×3 IMPLANT
GLOVE BIOGEL PI IND STRL 7.0 (GLOVE) ×3 IMPLANT
GLOVE BIOGEL PI IND STRL 7.5 (GLOVE) ×1 IMPLANT
GLOVE BIOGEL PI INDICATOR 7.0 (GLOVE) ×6
GLOVE BIOGEL PI INDICATOR 7.5 (GLOVE) ×2
GOWN STRL REUS W/ TWL LRG LVL3 (GOWN DISPOSABLE) ×2 IMPLANT
GOWN STRL REUS W/ TWL XL LVL3 (GOWN DISPOSABLE) ×1 IMPLANT
GOWN STRL REUS W/TWL LRG LVL3 (GOWN DISPOSABLE) ×4
GOWN STRL REUS W/TWL XL LVL3 (GOWN DISPOSABLE) ×2
GRASPER SUT TROCAR 14GX15 (MISCELLANEOUS) ×3 IMPLANT
IV CATH 14GX2 1/4 (CATHETERS) ×3 IMPLANT
KIT BASIN OR (CUSTOM PROCEDURE TRAY) ×3 IMPLANT
KIT ROOM TURNOVER OR (KITS) ×3 IMPLANT
NEEDLE INSUFFLATION 14GA 120MM (NEEDLE) ×3 IMPLANT
NS IRRIG 1000ML POUR BTL (IV SOLUTION) ×3 IMPLANT
PAD ARMBOARD 7.5X6 YLW CONV (MISCELLANEOUS) ×6 IMPLANT
POUCH RETRIEVAL ECOSAC 10 (ENDOMECHANICALS) ×1 IMPLANT
POUCH RETRIEVAL ECOSAC 10MM (ENDOMECHANICALS) ×2
POUCH SPECIMEN RETRIEVAL 10MM (ENDOMECHANICALS) IMPLANT
SCISSORS LAP 5X35 DISP (ENDOMECHANICALS) ×3 IMPLANT
SET CHOLANGIOGRAPHY FRANKLIN (SET/KITS/TRAYS/PACK) ×3 IMPLANT
SET IRRIG TUBING LAPAROSCOPIC (IRRIGATION / IRRIGATOR) ×3 IMPLANT
SLEEVE ENDOPATH XCEL 5M (ENDOMECHANICALS) ×6 IMPLANT
SPECIMEN JAR SMALL (MISCELLANEOUS) ×3 IMPLANT
SPONGE GAUZE 2X2 STER 10/PKG (GAUZE/BANDAGES/DRESSINGS) ×2
STRIP CLOSURE SKIN 1/2X4 (GAUZE/BANDAGES/DRESSINGS) ×2 IMPLANT
SUT MNCRL AB 3-0 PS2 18 (SUTURE) ×3 IMPLANT
TOWEL OR 17X24 6PK STRL BLUE (TOWEL DISPOSABLE) ×3 IMPLANT
TOWEL OR 17X26 10 PK STRL BLUE (TOWEL DISPOSABLE) ×3 IMPLANT
TRAY LAPAROSCOPIC MC (CUSTOM PROCEDURE TRAY) ×3 IMPLANT
TROCAR XCEL NON-BLD 11X100MML (ENDOMECHANICALS) ×3 IMPLANT
TROCAR XCEL NON-BLD 5MMX100MML (ENDOMECHANICALS) ×3 IMPLANT
TUBING INSUFFLATION (TUBING) ×3 IMPLANT

## 2017-02-22 NOTE — Progress Notes (Signed)
   Subjective: Daisy Pearson is doing well this AM. She understands that she will be going to surgery today. Her abdominal pain and nausea are much improved over the interval. Discussed the plan for surgery today and as long as there are no complications and she is able to tolerate intake she will likely be discharged tomorrow. She is agreeable with the plan. Daughter at bedside. No questions or concerns.  Objective: Vital signs in last 24 hours: Vitals:   02/21/17 0608 02/21/17 1500 02/21/17 2100 02/22/17 0354  BP: (!) 176/58 (!) 174/64 (!) 171/61 (!) 143/51  Pulse: 62 61 74 77  Resp: 18 18 19 19   Temp: 97.6 F (36.4 C) 98.4 F (36.9 C) 98 F (36.7 C) 98.1 F (36.7 C)  TempSrc: Oral Oral Oral Oral  SpO2: 99% 99% 99% 97%  Weight:      Height:       Physical Exam  Constitutional: She is oriented to person, place, and time. She appears well-developed and well-nourished.  HENT:  Head: Normocephalic and atraumatic.  Eyes: Pupils are equal, round, and reactive to light. Conjunctivae are normal.  Cardiovascular: Normal rate, regular rhythm, normal heart sounds and intact distal pulses.   Pulmonary/Chest: Effort normal and breath sounds normal. She has no wheezes. She has no rales.  Abdominal: Soft. Bowel sounds are normal. There is no tenderness. There is no rebound and no guarding.  Musculoskeletal: She exhibits no edema.  Neurological: She is alert and oriented to person, place, and time.   Assessment/Plan: Principal Problem:   Acute pancreatitis Active Problems:   Type 2 diabetes mellitus ([place])   Acute kidney injury ([place])   Essential hypertension   A-fib ([place])  1. Gallstone Pancreatitis  - Abdominal pain and nausea significantly improved - ERCP with removal of 3-4 stones on 02/19/17 - Surgery plans to do a cholecystectomy today  - Post surgery with advance diet - Likely discharge tomorrow   2. Diabetes Mellitus  - Last A1c 7.7  - Continue Levemir 10 units  - SSI-M post  surgery   3. Hypertension  - Continue Amlodipine 5mg  QD, Enalapril 10mg  QD, and Metoprolol 25mg  BID  4. A-fib  - Rhythm control with Amiodarone 200mg  QD - Continue metoprolol 25mg  BID - Will restart Warfarin on 7/31  5. Constipation  - Miralax + Senokot-S 2 tabs BID  Dispo: Anticipated discharge in approximately 1-2 day(s).   Ina Homes, MD 02/22/2017, 6:52 AM My Pager: [phone]

## 2017-02-22 NOTE — Op Note (Signed)
02/22/2017  10:38 AM  PATIENT:  Daisy Pearson  83 y.o. female  PRE-OPERATIVE DIAGNOSIS:  GALLSTONE PANCREATITIS  POST-OPERATIVE DIAGNOSIS:  GALLSTONE PANCREATITIS  PROCEDURE:  Procedure(s): LAPAROSCOPIC CHOLECYSTECTOMY WITH INTRAOPERATIVE CHOLANGIOGRAM (N/A)  SURGEON:  Surgeon(s) and Role:    * Ralene Ok, MD - Primary  ANESTHESIA:   local and general  EBL:  <5cc  BLOOD ADMINISTERED:none  DRAINS: none   LOCAL MEDICATIONS USED:  BUPIVICAINE   SPECIMEN:  Source of Specimen:  gallbladder  DISPOSITION OF SPECIMEN:  PATHOLOGY  COUNTS:  YES  TOURNIQUET:  * No tourniquets in log *  DICTATION: .Dragon Dictation The patient was taken to the operating and placed in the supine position with bilateral SCDs in place. The patient was prepped and draped in the usual sterile fashion. A time out was called and all facts were verified. A pneumoperitoneum was obtained via A Veress needle technique to a pressure of 6mm of mercury.  A 57mm trochar was then placed in the right upper quadrant under visualization, and there were no injuries to any abdominal organs. A 11 mm port was then placed in the umbilical region after infiltrating with local anesthesia under direct visualization. A second and third epigastric port and right lower quadrant port placement under direct visualization, respectively. The liver was very fatty and large. The gallbladder was identified and retracted, the peritoneum was then sharply dissected from the gallbladder and this dissection was carried down to Calot's triangle. The gallbladder was identified and stripped away circumferentially and seen going into the gallbladder 360, the critical angle was obtained. A Cook catheter was used to perform an intraoperative cholangiogram. The cholangiogram showed no filling defects and emptied easily into the duodenum. The common bile duct appeared dilated. 2 clips were placed proximally one distally and the cystic duct  transected. The cystic artery was identified and 2 clips placed proximally and one distally and transected. We then proceeded to remove the gallbladder off the hepatic fossa with Bovie cautery. A retrieval bag was then placed in the abdomen and gallbladder placed in the bag. The hepatic fossa was then reexamined and hemostasis was achieved with Bovie cautery and was excellent at the end of the case. The subhepatic fossa and perihepatic fossa was then irrigated until the effluent was clear. The 11 mm trocar fascia was reapproximated with the Endo Close #1 Vicryl x2. The pneumoperitoneum was evacuated and all trochars removed under direct visulalization. The skin was then closed with 4-0 Monocryl and the skin dressed with Steri-Strips, gauze, and tape. The patient was awaken from general anesthesia and taken to the recovery room in stable condition.   PLAN OF CARE: Admit for overnight observation  PATIENT DISPOSITION:  PACU - hemodynamically stable.   Delay start of Pharmacological VTE agent (>24hrs) due to surgical blood loss or risk of bleeding: not applicable

## 2017-02-22 NOTE — Progress Notes (Signed)
    Pt did well with lap chole. No cardiology issues Will sign off.    Signed, Mertie Moores, MD  02/22/2017, 6:25 PM

## 2017-02-22 NOTE — Care Management Important Message (Signed)
Important Message  Patient Details  Name: Daisy Pearson MRN: [id number] Date of Birth: 04-24-1934   Medicare Important Message Given:  Yes  Patient signed on 02/19/2017   Orbie Pyo 02/22/2017, 9:09 AM

## 2017-02-22 NOTE — Anesthesia Procedure Notes (Signed)
Procedure Name: Intubation Date/Time: 02/22/2017 10:04 AM Performed by: Rush Farmer E Pre-anesthesia Checklist: Patient identified, Emergency Drugs available, Suction available and Patient being monitored Patient Re-evaluated:Patient Re-evaluated prior to induction Oxygen Delivery Method: Circle system utilized Preoxygenation: Pre-oxygenation with 100% oxygen Induction Type: IV induction Ventilation: Mask ventilation without difficulty Laryngoscope Size: Mac and 3 Grade View: Grade I Tube type: Oral Tube size: 7.0 mm Number of attempts: 1 Airway Equipment and Method: Stylet Placement Confirmation: ETT inserted through vocal cords under direct vision,  positive ETCO2 and breath sounds checked- equal and bilateral Secured at: 20 cm Tube secured with: Tape Dental Injury: Teeth and Oropharynx as per pre-operative assessment

## 2017-02-22 NOTE — Anesthesia Postprocedure Evaluation (Signed)
Anesthesia Post Note  Patient: Daisy Pearson  Procedure(s) Performed: Procedure(s) (LRB): LAPAROSCOPIC CHOLECYSTECTOMY WITH INTRAOPERATIVE CHOLANGIOGRAM (N/A)     Patient location during evaluation: PACU Anesthesia Type: General Level of consciousness: awake and alert Pain management: pain level controlled Vital Signs Assessment: post-procedure vital signs reviewed and stable Respiratory status: spontaneous breathing, nonlabored ventilation, respiratory function stable and patient connected to nasal cannula oxygen Cardiovascular status: blood pressure returned to baseline and stable Postop Assessment: no signs of nausea or vomiting Anesthetic complications: no    Last Vitals:  Vitals:   02/22/17 1118 02/22/17 1130  BP: (!) 172/66 (!) 180/63  Pulse: (!) 59 (!) 59  Resp: 15 16  Temp:      Last Pain:  Vitals:   02/22/17 1130  TempSrc:   PainSc: 0-No pain                 Jakyri Brunkhorst

## 2017-02-22 NOTE — Progress Notes (Signed)
Internal Medicine Attending:   I saw and examined the patient. I reviewed the resident's note and I agree with the resident's findings and plan as documented in the resident's note.  Patient feels well this morning with no new complaints. Patient is now status post cholecystectomy. She was initially admitted for worsening abdominal pain, decreased by mouth intake, nausea and vomiting and found to have acute gallstone pancreatitis. She underwent an ERCP with gastroenterology with removal 3-4 stones on 02/19/2017 and her symptoms had improved. She underwent a cholecystectomy today. We will observe her overnight and discharge her in the morning if everything remains stable. No further workup for now.

## 2017-02-22 NOTE — Progress Notes (Signed)
Physical Therapy Treatment/ Re-eval Patient Details Name: Daisy Pearson MRN: [id number] DOB: 1933-12-14 Today's Date: 02/22/2017    History of Present Illness Daisy Pearson is a 83 y.o. female with past medical history of paroxysmal atrial fibrillation currently on Coumadin, diabetes, chronic kidney disease, history of coronary artery disease, history of pacemaker placement admitted to the hospital with the abdominal pain nausea and vomiting. She was found to have elevated lipase of 798. Normal ultrasound without any cholelithiasis.   W/u for acute pancreatitis.  Pt had cholecystectomy on 7/30.      PT Comments    Pt admitted with above diagnosis. Pt currently with functional limitations due to balance and endurance deficits. Pt goals still appropriate so continue with goals set previously.  No changes in strength and pain actually less than previous sessions. Will continue to progress pt as able.  Pt will benefit from skilled PT to increase their independence and safety with mobility to allow discharge to the venue listed below.     Follow Up Recommendations  Home health PT;Supervision/Assistance - 24 hour     Equipment Recommendations  None recommended by PT    Recommendations for Other Services       Precautions / Restrictions Precautions Precautions: Fall Restrictions Weight Bearing Restrictions: No    Mobility  Bed Mobility Overal bed mobility: Needs Assistance Bed Mobility: Supine to Sit;Sit to Supine     Supine to sit: Supervision Sit to supine: Supervision   General bed mobility comments: Supervision for safety  Transfers Overall transfer level: Needs assistance Equipment used: 1 person hand held assist Transfers: Sit to/from Stand Sit to Stand: Min guard         General transfer comment: Husband asked PTA to help pt onto [facility]. PTA transfered to [facility], Min guard for safety, pt was able to wipe herself while PTA held gown up. VC's for scooting to EOB and  hand placement. Pt just had surgery today therefore did not push pt to walk at this time.   Ambulation/Gait             General Gait Details: Did not push pt as she had surgery earlier today.    Stairs            Wheelchair Mobility    Modified Rankin (Stroke Patients Only)       Balance Overall balance assessment: Needs assistance Sitting-balance support: Feet supported;No upper extremity supported Sitting balance-Leahy Scale: Good Sitting balance - Comments: pt was sitting up in bed pulling leg circulation pumps off when i arrived.    Standing balance support: During functional activity;Single extremity supported Standing balance-Leahy Scale: Fair Standing balance comment: transfers  w/o AD                            Cognition Arousal/Alertness: Awake/alert Behavior During Therapy: WFL for tasks assessed/performed Overall Cognitive Status: Within Functional Limits for tasks assessed                                        Exercises      General Comments General comments (skin integrity, edema, etc.): Pt strength has not changed since initial evaluation with pt with generalized weakness.  Of note, pts pain is much better than initial eval.       Pertinent Vitals/Pain Pain Assessment: No/denies pain    Home  Living                      Prior Function            PT Goals (current goals can now be found in the care plan section) Progress towards PT goals: Progressing toward goals    Frequency    Min 3X/week      PT Plan Current plan remains appropriate    Co-evaluation              AM-PAC PT "6 Clicks" Daily Activity  Outcome Measure  Difficulty turning over in bed (including adjusting bedclothes, sheets and blankets)?: None Difficulty moving from lying on back to sitting on the side of the bed? : None Difficulty sitting down on and standing up from a chair with arms (e.g., wheelchair, bedside  commode, etc,.)?: A Little Help needed moving to and from a bed to chair (including a wheelchair)?: A Little Help needed walking in hospital room?: A Little Help needed climbing 3-5 steps with a railing? : A Lot 6 Click Score: 19    End of Session Equipment Utilized During Treatment: Gait belt Activity Tolerance: Patient tolerated treatment well Patient left: in bed;with call bell/phone within reach;with bed alarm set;with family/visitor present Nurse Communication: Mobility status PT Visit Diagnosis: Muscle weakness (generalized) (M62.81);Dizziness and giddiness (R42)     Time: 1430-1440 PT Time Calculation (min) (ACUTE ONLY): 10 min  Charges:       Re-evaluation 1                G Codes:       Moiz Ryant,PT Acute Rehabilitation [phone] (pager)    Denice Paradise 02/22/2017, 4:20 PM

## 2017-02-22 NOTE — Progress Notes (Signed)
[place] for heparin Indication: atrial fibrillation  Allergies  Allergen Reactions  . Esomeprazole Sodium Swelling  . Penicillins Anaphylaxis and Rash    Has patient had a PCN reaction causing immediate rash, facial/tongue/throat swelling, SOB or lightheadedness with hypotension: Yes Has patient had a PCN reaction causing severe rash involving mucus membranes or skin necrosis: No Has patient had a PCN reaction that required hospitalization: No Has patient had a PCN reaction occurring within the last 10 years: No If all of the above answers are "NO", then may proceed with Cephalosporin use.  . Clindamycin/Lincomycin Hives       . Hydrocodone Other (See Comments)    Makes her crazy  . Nsaids Hives and Other (See Comments)    kidney failure  . Valsartan Swelling  . Prilosec [Omeprazole] Swelling and Rash   Patient Measurements: Height: 5' 4.02" (162.6 cm) Weight: 204 lb (92.5 kg) IBW/kg (Calculated) : 54.74 Heparin Dosing Weight: 75.6 kg  Assessment: [name] presents on 7/22 with abdominal pain, nausea, and vomiting. PTA she is on Coumadin 4mg  daily exc for 2mg  on Sat PTA for Afib. Had large jump in INR on home dose prior to holding. Now to restart heparin s/p OR. GI states ok to restart Coumadin too if no complication. Patient now agreed to do lap chole but surgery delayed till 7/30. Heparin on hold for surgery today. Now s/p OR, paged two MDs to see if we should bridge back to Coumadin with no response. MD note states to plan to restart Coumadin tomorrow. INR down to 1.59. Hgb 10.3, plts wnl.   Goal of Therapy:  Heparin level 0.3-0.7 units/ml Monitor platelets by anticoagulation protocol: Yes   Plan: Heparin gtt remains on hold per MD F/U restart of anticoag and need to bridge with Lovenox or heparin gtt   Elenor Quinones, PharmD, [facility] Clinical Pharmacist Pager [phone] 02/22/2017 2:32 PM

## 2017-02-22 NOTE — Transfer of Care (Signed)
Immediate Anesthesia Transfer of Care Note  Patient: Daisy Pearson  Procedure(s) Performed: Procedure(s): LAPAROSCOPIC CHOLECYSTECTOMY WITH INTRAOPERATIVE CHOLANGIOGRAM (N/A)  Patient Location: PACU  Anesthesia Type:General  Level of Consciousness: awake, alert  and oriented  Airway & Oxygen Therapy: Patient Spontanous Breathing and Patient connected to nasal cannula oxygen  Post-op Assessment: Report given to RN, Post -op Vital signs reviewed and stable and Patient moving all extremities X 4  Post vital signs: Reviewed and stable  Last Vitals:  Vitals:   02/22/17 1058 02/22/17 1100  BP: (!) 162/69   Pulse: 63 62  Resp: 18 20  Temp: (!) 36.3 C     Last Pain:  Vitals:   02/22/17 0830  TempSrc: Oral  PainSc:       Patients Stated Pain Goal: 2 (34/28/76 8115)  Complications: No apparent anesthesia complications

## 2017-02-22 NOTE — Progress Notes (Signed)
PT Cancellation Note  Patient Details Name: Daisy Pearson MRN: [id number] DOB: 12-27-1933   Cancelled Treatment:    Reason Eval/Treat Not Completed: Patient at procedure or test/unavailable (Pt in surgery) Will check back tomorrow for therapy session.   Daisy Pearson, SPTA [id number]   Daisy Pearson 02/22/2017, 10:37 AM

## 2017-02-23 ENCOUNTER — Encounter (HOSPITAL_COMMUNITY): Payer: Self-pay | Admitting: General Surgery

## 2017-02-23 LAB — CBC
HEMATOCRIT: 32.9 % — AB (ref 36.0–46.0)
HEMOGLOBIN: 11 g/dL — AB (ref 12.0–15.0)
MCH: 32.3 pg (ref 26.0–34.0)
MCHC: 33.4 g/dL (ref 30.0–36.0)
MCV: 96.5 fL (ref 78.0–100.0)
Platelets: 228 10*3/uL (ref 150–400)
RBC: 3.41 MIL/uL — ABNORMAL LOW (ref 3.87–5.11)
RDW: 13.7 % (ref 11.5–15.5)
WBC: 5.4 10*3/uL (ref 4.0–10.5)

## 2017-02-23 LAB — GLUCOSE, CAPILLARY
GLUCOSE-CAPILLARY: 276 mg/dL — AB (ref 65–99)
GLUCOSE-CAPILLARY: 356 mg/dL — AB (ref 65–99)

## 2017-02-23 LAB — PROTIME-INR
INR: 1.68
PROTHROMBIN TIME: 20 s — AB (ref 11.4–15.2)

## 2017-02-23 LAB — HEPARIN LEVEL (UNFRACTIONATED): Heparin Unfractionated: <0.1 IU/mL — ABNORMAL LOW (ref 0.30–0.70)

## 2017-02-23 MED ORDER — ENALAPRIL MALEATE 10 MG PO TABS: 10.0000 mg | ORAL_TABLET | Freq: Every day | ORAL | 0 refills | Status: DC

## 2017-02-23 MED ORDER — ENOXAPARIN SODIUM 150 MG/ML ~~LOC~~ SOLN: 1.5000 mg/kg | SUBCUTANEOUS | 0 refills | Status: DC

## 2017-02-23 MED ORDER — SENNOSIDES-DOCUSATE SODIUM 8.6-50 MG PO TABS: 2.0000 | ORAL_TABLET | Freq: Two times a day (BID) | ORAL | 0 refills | Status: DC

## 2017-02-23 MED ORDER — POLYETHYLENE GLYCOL 3350 17 G PO PACK: 17.0000 g | PACK | Freq: Every day | ORAL | 0 refills | Status: DC

## 2017-02-23 MED ORDER — AMLODIPINE BESYLATE 5 MG PO TABS: 5.0000 mg | ORAL_TABLET | Freq: Every day | ORAL | 0 refills | Status: AC

## 2017-02-23 NOTE — Progress Notes (Signed)
Internal Medicine Attending:   I saw and examined the patient. I reviewed the resident's note and I agree with the resident's findings and plan as documented in the resident's note.  Patient feels well today with no new complaints. She is able to tolerate an oral diet. No recurrent abdominal pain or nausea and vomiting. Patient was initially admitted with acute pancreatitis secondary to gallstones. She had an ERCP on 02/19/2017 with removal of 3-4 stones and had a cholecystectomy done yesterday. She is much improved now and is stable for discharge home today with follow-up with her PCP as well as surgery. No further workup for now. Case discussed with daughter at bedside who is in agreement with plan.

## 2017-02-23 NOTE — Discharge Instructions (Signed)
It was a pleasure to meet you, we have made the following changes:  - Please start the Lovenox and Warfarin on 08/01, follow-up with the warfarin clinic to have your INR checked as soon as possible. Once your, INR = 2.0 please stop the Lovenox and continue the Warfarin.   - Please follow-up with surgery in on 03/09/2017   - Please follow-up with your Primary care doctor to discuss the following changes to your blood pressure medications: Enalapril 10 mg daily, and Amlodipine 5 mg daily.   Laparoscopic Cholecystectomy, Care After This sheet gives you information about how to care for yourself after your procedure. Your health care provider may also give you more specific instructions. If you have problems or questions, contact your health care provider. What can I expect after the procedure? After the procedure, it is common to have:  Pain at your incision sites. You will be given medicines to control this pain.  Mild nausea or vomiting.  Bloating and possible shoulder pain from the air-like gas that was used during the procedure.  Follow these instructions at home: Incision care   Follow instructions from your health care provider about how to take care of your incisions. Make sure you: ? Wash your hands with soap and water before you change your bandage (dressing). If soap and water are not available, use hand sanitizer. ? Change your dressing as told by your health care provider. ? Leave stitches (sutures), skin glue, or adhesive strips in place. These skin closures may need to be in place for 2 weeks or longer. If adhesive strip edges start to loosen and curl up, you may trim the loose edges. Do not remove adhesive strips completely unless your health care provider tells you to do that.  Do not take baths, swim, or use a hot tub until your health care provider approves. Ask your health care provider if you can take showers. You may only be allowed to take sponge baths for  bathing.  Check your incision area every day for signs of infection. Check for: ? More redness, swelling, or pain. ? More fluid or blood. ? Warmth. ? Pus or a bad smell. Activity  Do not drive or use heavy machinery while taking prescription pain medicine.  Do not lift anything that is heavier than 10 lb (4.5 kg) until your health care provider approves.  Do not play contact sports until your health care provider approves.  Do not drive for 24 hours if you were given a medicine to help you relax (sedative).  Rest as needed. Do not return to work or school until your health care provider approves. General instructions  Take over-the-counter and prescription medicines only as told by your health care provider.  To prevent or treat constipation while you are taking prescription pain medicine, your health care provider may recommend that you: ? Drink enough fluid to keep your urine clear or pale yellow. ? Take over-the-counter or prescription medicines. ? Eat foods that are high in fiber, such as fresh fruits and vegetables, whole grains, and beans. ? Limit foods that are high in fat and processed sugars, such as fried and sweet foods. Contact a health care provider if:  You develop a rash.  You have more redness, swelling, or pain around your incisions.  You have more fluid or blood coming from your incisions.  Your incisions feel warm to the touch.  You have pus or a bad smell coming from your incisions.  You have a  fever.  One or more of your incisions breaks open. Get help right away if:  You have trouble breathing.  You have chest pain.  You have increasing pain in your shoulders.  You faint or feel dizzy when you stand.  You have severe pain in your abdomen.  You have nausea or vomiting that lasts for more than one day.  You have leg pain. This information is not intended to replace advice given to you by your health care provider. Make sure you discuss any  questions you have with your health care provider. Document Released: 07/13/2005 Document Revised: 02/01/2016 Document Reviewed: 12/30/2015 Elsevier Interactive Patient Education  2017 [place] [employer] LAPAROSCOPIC SURGERY: POST OP INSTRUCTIONS Always review your discharge instruction sheet given to you by the facility where your surgery was performed. IF YOU HAVE DISABILITY OR FAMILY LEAVE FORMS, YOU MUST BRING THEM TO THE OFFICE FOR PROCESSING.   DO NOT GIVE THEM TO YOUR DOCTOR.  1. A prescription for pain medication may be given to you upon discharge.  Take your pain medication as prescribed, if needed.  If narcotic pain medicine is not needed, then you may take acetaminophen (Tylenol) or ibuprofen (Advil) as needed. 2. Take your usually prescribed medications unless otherwise directed. 3. If you need a refill on your pain medication, please contact your pharmacy.  They will contact our office to request authorization. Prescriptions will not be filled after 5pm or on week-ends. 4. You should follow a light diet the first few days after arrival home, such as soup and crackers, etc.  Be sure to include lots of fluids daily. 5. Most patients will experience some swelling and bruising in the area of the incisions.  Ice packs will help.  Swelling and bruising can take several days to resolve.  6. It is common to experience some constipation if taking pain medication after surgery.  Increasing fluid intake and taking a stool softener (such as Colace) will usually help or prevent this problem from occurring.  A mild laxative (Milk of Magnesia or Miralax) should be taken according to package instructions if there are no bowel movements after 48 hours. 7. Unless discharge instructions indicate otherwise, you may remove your bandages 24-48 hours after surgery, and you may shower at that time.  You may have steri-strips (small skin tapes) in place directly over the  incision.  These strips should be left on the skin for 7-10 days.  If your surgeon used skin glue on the incision, you may shower in 24 hours.  The glue will flake off over the next 2-3 weeks.  Any sutures or staples will be removed at the office during your follow-up visit. 8. ACTIVITIES:  You may resume regular (light) daily activities beginning the next day--such as daily self-care, walking, climbing stairs--gradually increasing activities as tolerated.  You may have sexual intercourse when it is comfortable.  Refrain from any heavy lifting or straining until approved by your doctor. a. You may drive when you are no longer taking prescription pain medication, you can comfortably wear a seatbelt, and you can safely maneuver your car and apply brakes. b. RETURN TO WORK:  __________________________________________________________ 9. You should see your doctor in the office for a follow-up appointment approximately 2-3 weeks after your surgery.  Make sure that you call for this appointment within a day or two after you arrive home to insure a convenient appointment time. 10. OTHER INSTRUCTIONS: __________________________________________________________________________________________________________________________ __________________________________________________________________________________________________________________________ WHEN TO CALL YOUR DOCTOR: 1.  Fever over 101.0 2. Inability to urinate 3. Continued bleeding from incision. 4. Increased pain, redness, or drainage from the incision. 5. Increasing abdominal pain  The clinic staff is available to answer your questions during regular business hours.  Please dont hesitate to call and ask to speak to one of the nurses for clinical concerns.  If you have a medical emergency, go to the nearest emergency room or call 911.  A surgeon from [facility] is always on call at the hospital. [address] [place], [place],  [place]  [address] ? [address], [place], [place]   [address] [phone] ? [phone] ? FAX [phone] Web site: [email or web address]

## 2017-02-23 NOTE — Progress Notes (Signed)
Pt discharged home in stable condition. Discharge teaching provided with no concerns expressed. AVS given to pt. Electronic discharge meds script sent to pt's pharmacy of choice

## 2017-02-23 NOTE — Progress Notes (Signed)
Patient ID: Daisy Pearson, female   DOB: February 26, 1934, 83 y.o.   MRN: [id number]  [facility] Surgery Progress Note  1 Day Post-Op  Subjective: CC- gallstone pancreatitis Patient sitting up in bed eating breakfast, daughter at bedside. States that she feels great. Denies abdominal pain, nausea, vomiting. Ambulating well. States that she is ready to go home.  Objective: Vital signs in last 24 hours: Temp:  [97.4 F (36.3 C)-98.5 F (36.9 C)] 98.2 F (36.8 C) (07/31 0444) Pulse Rate:  [59-63] 59 (07/31 0444) Resp:  [15-19] 16 (07/31 0444) BP: (155-182)/(60-95) 166/72 (07/31 0444) SpO2:  [97 %-100 %] 97 % (07/31 0444) Last BM Date: 02/22/17  Intake/Output from previous day: 07/30 0701 - 07/31 0700 In: 1903.3 [P.O.:600; I.V.:1303.3] Out: 625 [Urine:600; Blood:25] Intake/Output this shift: Total I/O In: 200 [P.O.:200] Out: -   PE: Gen:  Alert, NAD, pleasant HEENT: EOM's intact, pupils equal and round Pulm:  effort normal Abd: Soft, NT/ND, +BS, incisions with C/D/I dressings Psych: A&Ox3  Skin: no rashes noted, warm and dry  Lab Results:   Recent Labs  02/22/17 0456 02/23/17 0426  WBC 5.1 5.4  HGB 10.3* 11.0*  HCT 31.0* 32.9*  PLT 244 228   BMET No results for input(s): NA, K, CL, CO2, GLUCOSE, BUN, CREATININE, CALCIUM in the last 72 hours. PT/INR  Recent Labs  02/22/17 0456 02/23/17 0426  LABPROT 19.1* 20.0*  INR 1.59 1.68   CMP     Component Value Date/Time   NA 136 02/20/2017 0613   K 3.7 02/20/2017 0613   CL 106 02/20/2017 0613   CO2 21 (L) 02/20/2017 0613   GLUCOSE 180 (H) 02/20/2017 0613   BUN 11 02/20/2017 0613   CREATININE 1.22 (H) 02/20/2017 0613   CALCIUM 7.9 (L) 02/20/2017 0613   PROT 5.8 (L) 02/20/2017 0613   ALBUMIN 2.5 (L) 02/20/2017 0613   AST 114 (H) 02/20/2017 0613   ALT 146 (H) 02/20/2017 0613   ALKPHOS 196 (H) 02/20/2017 0613   BILITOT 1.4 (H) 02/20/2017 0613   GFRNONAA 40 (L) 02/20/2017 0613   GFRAA 47 (L) 02/20/2017  0613   Lipase     Component Value Date/Time   LIPASE 23 02/20/2017 7026       Studies/Results: Dg Cholangiogram Operative  Result Date: 02/22/2017 CLINICAL DATA:  83 year old female with a history of laparoscopic cholecystectomy for cholelithiasis EXAM: INTRAOPERATIVE CHOLANGIOGRAM TECHNIQUE: Cholangiographic images from the C-arm fluoroscopic device were submitted for interpretation post-operatively. Please see the procedural report for the amount of contrast and the fluoroscopy time utilized. COMPARISON:  None. FINDINGS: Surgical instruments project over the upper abdomen. There is cannulation of the cystic duct/gallbladder neck, with antegrade infusion of contrast. Caliber of the extrahepatic ductal system within normal limits. No large filling defect identified. Free flow of contrast across the ampulla. IMPRESSION: Intraoperative cholangiogram demonstrates extrahepatic biliary ducts of unremarkable caliber, with no large filling defect identified. Free flow of contrast across the ampulla. Please refer to the dictated operative report for full details of intraoperative findings and procedure Electronically Signed   By: Corrie Mckusick D.O.   On: 02/22/2017 11:13    Anti-infectives: Anti-infectives    Start     Dose/Rate Route Frequency Ordered Stop   02/22/17 0600  ciprofloxacin (CIPRO) IVPB 400 mg     400 mg 200 mL/hr over 60 Minutes Intravenous On call to O.R. 02/21/17 1051 02/22/17 1035   02/21/17 0600  vancomycin (VANCOCIN) IVPB 1000 mg/200 mL premix  Status:  Discontinued  1,000 mg 200 mL/hr over 60 Minutes Intravenous To Short Stay 02/20/17 1911 02/22/17 0600   02/19/17 1200  levofloxacin (LEVAQUIN) IVPB 500 mg    Comments:  Pre-ERCP Levaquin . Patient is scheduled for ERCP tomorrow at 1 pm   500 mg 100 mL/hr over 60 Minutes Intravenous  Once 02/18/17 1404 02/19/17 1445   02/18/17 1415  levofloxacin (LEVAQUIN) IVPB 500 mg  Status:  Discontinued    Comments:  Pre-ERCP Levaquin  . Patient is scheduled for ERCP tomorrow at 1 pm   500 mg 100 mL/hr over 60 Minutes Intravenous  Once 02/18/17 1404 02/18/17 1404   02/14/17 1900  ciprofloxacin (CIPRO) IVPB 400 mg  Status:  Discontinued     400 mg 200 mL/hr over 60 Minutes Intravenous  Once 02/14/17 1847 02/14/17 1919   02/14/17 1900  metroNIDAZOLE (FLAGYL) IVPB 500 mg  Status:  Discontinued     500 mg 100 mL/hr over 60 Minutes Intravenous  Once 02/14/17 1847 02/14/17 1919       Assessment/Plan CAD, status post CABG Chronic atrial fibrillation on Coumadin - coumadin restarted Permanent transvenous pacemaker Type 2 diabetes Hypertension Hyperlipidemia Fibromyalgia with chronic pain - Percocet/60 per month. Chronic kidney disease Body mass index is 35  GALLSTONE PANCREATITIS S/p lap chole with IOC 7/30 Dr. Rosendo Gros - POD 1 - IOC negative - WBC WNL, afebrile - tolerating diet, pain well controlled  Plan - patient is ready for discharge from surgical standpoint. Follow-up in clinic in 2-3 weeks. She does not need to go home on antibiotics.    LOS: 9 days    Wellington Hampshire , [facility] 02/23/2017, 9:38 AM Pager: [phone] Consults: [phone] Mon-Fri 7:00 am-4:30 pm Sat-Sun 7:00 am-11:30 am

## 2017-02-23 NOTE — Progress Notes (Signed)
   Subjective: Daisy Pearson is doing well over the interval with improvement in her abdominal pain and N/V. She was able to tolerate pizza and ice cream last night. Discussed the plan for discharge today. Daughter at bedside is requesting Lovenox bridge for her Warfarin as that is what they have always done in the past. Will discuss with surgery. No questions or concerns at this point.   Objective: Vital signs in last 24 hours: Vitals:   02/22/17 1700 02/22/17 2040 02/23/17 0201 02/23/17 0444  BP: (!) 176/61 (!) 182/60 (!) 168/62 (!) 166/72  Pulse: 63 62 60 (!) 59  Resp: 16 16 16 16   Temp: 98.5 F (36.9 C) (!) 97.5 F (36.4 C) 98.1 F (36.7 C) 98.2 F (36.8 C)  TempSrc: Oral Oral Oral Oral  SpO2: 97% 97% 97% 97%  Weight:      Height:       Physical Exam  Constitutional: She is oriented to person, place, and time. She appears well-developed and well-nourished. No distress.  HENT:  Head: Normocephalic and atraumatic.  Eyes: Pupils are equal, round, and reactive to light. Conjunctivae are normal.  Cardiovascular: Normal rate and intact distal pulses.  Exam reveals no friction rub.   No murmur heard. Irregular rhythm  Pulmonary/Chest: Effort normal and breath sounds normal.  Faint crackles in the lower lung fields  Abdominal: Soft. Bowel sounds are normal. She exhibits no mass. There is tenderness (deep palpation). There is no guarding.  Musculoskeletal: She exhibits no edema.  Neurological: She is alert and oriented to person, place, and time.  Skin: Skin is warm and dry.   Assessment/Plan: Principal Problem:   Acute pancreatitis Active Problems:   Type 2 diabetes mellitus ([place])   Acute kidney injury ([place])   Essential hypertension   A-fib ([place])  1. Gallstone Pancreatitis  - Abdominal pain and nausea significantly improved - ERCP with removal of 3-4 stones on 02/19/17 - S/p cholecystectomy; surgery agrees with discharge and follow-up - Tolerating oral intake - Discharge  today   2. Diabetes Mellitus  - Last A1c 7.7  - Continue Levemir 10 units  - SSI-M   3. Hypertension  - Continue Amlodipine 5mg  QD, Enalapril 10mg  QD, and Metoprolol 25mg  BID  4. A-fib  - Rhythm control with Amiodarone 200mg  QD - Continue metoprolol 25mg  BID - Family requesting Lovenox bridge, will need follow-up with PCP to check INR shortly after discharge   5. Constipation  - Miralax + Senokot-S 2 tabs BID  Dispo: Anticipated discharge in approximately 0 day(s).   Ina Homes, MD 02/23/2017, 11:51 AM My Pager: [phone]

## 2017-02-23 NOTE — Progress Notes (Signed)
Physical Therapy Treatment Patient Details Name: Daisy Pearson MRN: [id number] DOB: March 04, 1934 Today's Date: 02/23/2017    History of Present Illness Daisy Pearson is a 83 y.o. female with past medical history of paroxysmal atrial fibrillation currently on Coumadin, diabetes, chronic kidney disease, history of coronary artery disease, history of pacemaker placement admitted to the hospital with the abdominal pain nausea and vomiting. She was found to have elevated lipase of 798. Normal ultrasound without any cholelithiasis.   W/u for acute pancreatitis.  Pt had cholecystectomy on 7/30.      PT Comments    Pt overall steady with ambulation and mobilizing well. Pt limited by fatigue, however able to perform functional activities in short bursts with rest breaks in between. Daughter reports she has been assisting pt with ambulation and showering this morning. Pt expected to d/c today with HHPT. Pt would benefit from continued skilled PT to increase functional independence.   Follow Up Recommendations  Home health PT;Supervision/Assistance - 24 hour     Equipment Recommendations  None recommended by PT    Recommendations for Other Services       Precautions / Restrictions Precautions Precautions: Fall Restrictions Weight Bearing Restrictions: No Other Position/Activity Restrictions:  (Pt able to tolerate long sitting in recliner for ~1 hour. )    Mobility  Bed Mobility Overal bed mobility: Needs Assistance Bed Mobility: Supine to Sit     Supine to sit: Supervision     General bed mobility comments: Supervision for safety,   Transfers Overall transfer level: Needs assistance Equipment used: Rolling walker (2 wheeled) Transfers: Sit to/from Stand Sit to Stand: Min guard         General transfer comment: Pt able to stand w/o use of UEs.   Ambulation/Gait Ambulation/Gait assistance: Min guard Ambulation Distance (Feet): 120 Feet Assistive device: Rolling  walker (2 wheeled) Gait Pattern/deviations: Step-through pattern Gait velocity: decreased. Gait velocity interpretation: Below normal speed for age/gender General Gait Details: Pt with overall steady gait, showed less fatigue using RW than previous sessions using HHA. Cueing for walker proximity.   Stairs            Wheelchair Mobility    Modified Rankin (Stroke Patients Only)       Balance Overall balance assessment: Needs assistance Sitting-balance support: Feet supported;No upper extremity supported Sitting balance-Leahy Scale: Good     Standing balance support: During functional activity;Single extremity supported Standing balance-Leahy Scale: Fair Standing balance comment: transfers  w/o AD                            Cognition Arousal/Alertness: Awake/alert Behavior During Therapy: WFL for tasks assessed/performed Overall Cognitive Status: Within Functional Limits for tasks assessed                                        Exercises      General Comments General comments (skin integrity, edema, etc.): Daughter in a RN and was present through out session. Daughter reports she has been up walking with pt and assisted pt with shower this AM. No LOB or problems to report.      Pertinent Vitals/Pain Pain Assessment: No/denies pain Pain Location: RUQ of abdomen Pain Descriptors / Indicators: Aching Pain Intervention(s): Monitored during session    Home Living  Prior Function            PT Goals (current goals can now be found in the care plan section) Acute Rehab PT Goals Patient Stated Goal: To return to home.  PT Goal Formulation: With patient Time For Goal Achievement: 03/01/17 Potential to Achieve Goals: Good Progress towards PT goals: Progressing toward goals    Frequency    Min 3X/week      PT Plan Current plan remains appropriate    Co-evaluation              AM-PAC PT "6  Clicks" Daily Activity  Outcome Measure  Difficulty turning over in bed (including adjusting bedclothes, sheets and blankets)?: None Difficulty moving from lying on back to sitting on the side of the bed? : None Difficulty sitting down on and standing up from a chair with arms (e.g., wheelchair, bedside commode, etc,.)?: A Little Help needed moving to and from a bed to chair (including a wheelchair)?: A Little Help needed walking in hospital room?: A Little Help needed climbing 3-5 steps with a railing? : A Little 6 Click Score: 20    End of Session   Activity Tolerance: Patient tolerated treatment well;Patient limited by fatigue Patient left: with family/visitor present;in chair;with call bell/phone within reach Nurse Communication: Mobility status PT Visit Diagnosis: Muscle weakness (generalized) (M62.81);Dizziness and giddiness (R42) Pain - Right/Left: Right     Time: 7903-8333 PT Time Calculation (min) (ACUTE ONLY): 11 min  Charges:  $Gait Training: 8-22 mins                    G Codes:       Benjiman Core, [place] Pager [phone] Acute Rehab   Allena Katz 02/23/2017, 11:28 AM

## 2017-05-15 ENCOUNTER — Other Ambulatory Visit: Payer: Self-pay | Admitting: Internal Medicine

## 2017-06-14 ENCOUNTER — Other Ambulatory Visit: Payer: Self-pay | Admitting: Internal Medicine

## 2017-07-14 ENCOUNTER — Other Ambulatory Visit: Payer: Self-pay | Admitting: Internal Medicine

## 2017-08-13 ENCOUNTER — Other Ambulatory Visit: Payer: Self-pay | Admitting: Internal Medicine

## 2019-07-01 ENCOUNTER — Emergency Department (HOSPITAL_BASED_OUTPATIENT_CLINIC_OR_DEPARTMENT_OTHER)
Admission: EM | Admit: 2019-07-01 | Discharge: 2019-07-01 | Disposition: A | Discharge status: Home / Self Care | Payer: Medicare Other | Attending: Emergency Medicine | Admitting: Emergency Medicine

## 2019-07-01 ENCOUNTER — Other Ambulatory Visit: Payer: Self-pay

## 2019-07-01 ENCOUNTER — Emergency Department (HOSPITAL_BASED_OUTPATIENT_CLINIC_OR_DEPARTMENT_OTHER): Payer: Medicare Other

## 2019-07-01 DIAGNOSIS — Z794 Long term (current) use of insulin: Secondary | ICD-10-CM | POA: Diagnosis not present

## 2019-07-01 DIAGNOSIS — T148XXA Other injury of unspecified body region, initial encounter: Secondary | ICD-10-CM

## 2019-07-01 DIAGNOSIS — Z79899 Other long term (current) drug therapy: Secondary | ICD-10-CM | POA: Insufficient documentation

## 2019-07-01 DIAGNOSIS — M25512 Pain in left shoulder: Secondary | ICD-10-CM | POA: Insufficient documentation

## 2019-07-01 DIAGNOSIS — X58XXXA Exposure to other specified factors, initial encounter: Secondary | ICD-10-CM | POA: Diagnosis not present

## 2019-07-01 DIAGNOSIS — Y939 Activity, unspecified: Secondary | ICD-10-CM | POA: Diagnosis not present

## 2019-07-01 DIAGNOSIS — Y999 Unspecified external cause status: Secondary | ICD-10-CM | POA: Diagnosis not present

## 2019-07-01 DIAGNOSIS — Y929 Unspecified place or not applicable: Secondary | ICD-10-CM | POA: Diagnosis not present

## 2019-07-01 DIAGNOSIS — S40011A Contusion of right shoulder, initial encounter: Secondary | ICD-10-CM | POA: Insufficient documentation

## 2019-07-01 DIAGNOSIS — N184 Chronic kidney disease, stage 4 (severe): Secondary | ICD-10-CM | POA: Diagnosis not present

## 2019-07-01 DIAGNOSIS — E1122 Type 2 diabetes mellitus with diabetic chronic kidney disease: Secondary | ICD-10-CM | POA: Diagnosis not present

## 2019-07-01 DIAGNOSIS — Z8673 Personal history of transient ischemic attack (TIA), and cerebral infarction without residual deficits: Secondary | ICD-10-CM | POA: Diagnosis not present

## 2019-07-01 DIAGNOSIS — Z950 Presence of cardiac pacemaker: Secondary | ICD-10-CM | POA: Insufficient documentation

## 2019-07-01 DIAGNOSIS — I129 Hypertensive chronic kidney disease with stage 1 through stage 4 chronic kidney disease, or unspecified chronic kidney disease: Secondary | ICD-10-CM | POA: Insufficient documentation

## 2019-07-01 LAB — CBC WITH DIFFERENTIAL/PLATELET
Abs Immature Granulocytes: 0.01 10*3/uL (ref 0.00–0.07)
Basophils Absolute: 0.1 10*3/uL (ref 0.0–0.1)
Basophils Relative: 1 %
Eosinophils Absolute: 0.3 10*3/uL (ref 0.0–0.5)
Eosinophils Relative: 4 %
HCT: 40.8 % (ref 36.0–46.0)
Hemoglobin: 12.8 g/dL (ref 12.0–15.0)
Immature Granulocytes: 0 %
Lymphocytes Relative: 32 %
Lymphs Abs: 2.6 10*3/uL (ref 0.7–4.0)
MCH: 29.7 pg (ref 26.0–34.0)
MCHC: 31.4 g/dL (ref 30.0–36.0)
MCV: 94.7 fL (ref 80.0–100.0)
Monocytes Absolute: 0.7 10*3/uL (ref 0.1–1.0)
Monocytes Relative: 8 %
Neutro Abs: 4.6 10*3/uL (ref 1.7–7.7)
Neutrophils Relative %: 55 %
Platelets: 278 10*3/uL (ref 150–400)
RBC: 4.31 MIL/uL (ref 3.87–5.11)
RDW: 13.3 % (ref 11.5–15.5)
WBC: 8.2 10*3/uL (ref 4.0–10.5)
nRBC: 0 % (ref 0.0–0.2)

## 2019-07-01 LAB — BASIC METABOLIC PANEL
Anion gap: 8 (ref 5–15)
BUN: 32 mg/dL — ABNORMAL HIGH (ref 8–23)
CO2: 22 mmol/L (ref 22–32)
Calcium: 9.8 mg/dL (ref 8.9–10.3)
Chloride: 109 mmol/L (ref 98–111)
Creatinine, Ser: 2.12 mg/dL — ABNORMAL HIGH (ref 0.44–1.00)
GFR calc Af Amer: 24 mL/min — ABNORMAL LOW (ref >60)
GFR calc non Af Amer: 21 mL/min — ABNORMAL LOW (ref >60)
Glucose, Bld: 65 mg/dL — ABNORMAL LOW (ref 70–99)
Potassium: 4.3 mmol/L (ref 3.5–5.1)
Sodium: 139 mmol/L (ref 135–145)

## 2019-07-01 LAB — PROTIME-INR
INR: 1.3 — ABNORMAL HIGH (ref 0.8–1.2)
Prothrombin Time: 16.2 seconds — ABNORMAL HIGH (ref 11.4–15.2)

## 2019-07-01 LAB — CBG MONITORING, ED
Glucose-Capillary: 55 mg/dL — ABNORMAL LOW (ref 70–99)
Glucose-Capillary: 82 mg/dL (ref 70–99)

## 2019-07-01 NOTE — ED Notes (Signed)
Patient transported to X-ray 

## 2019-07-01 NOTE — ED Notes (Signed)
Pt given apple juice per VORB from Dr. Ralene Bathe

## 2019-07-01 NOTE — ED Notes (Signed)
ED Provider at bedside. 

## 2019-07-01 NOTE — ED Notes (Signed)
MD aware of CBG 55. Pt alert and oriented. She is eating candy from her purse. Pt declined snack, states "I'm going to eat dinner when I leave and that's 10 minutes away"

## 2019-07-01 NOTE — ED Provider Notes (Signed)
[facility] EMERGENCY DEPARTMENT Provider Note   CSN: [id number] Arrival date & time: 07/01/19  1454     History   Chief Complaint Chief Complaint  Patient presents with  . Shoulder Pain    HPI Daisy Pearson is a 85 y.o. female.     The history is provided by the patient, a relative and medical records. No language interpreter was used.  Shoulder Pain  Daisy Pearson is a 85 y.o. female who presents to the Emergency Department complaining of shoulder pain. She presents to the emergency department accompanied by her daughter for evaluation of acute on chronic left shoulder pain. She has been experiencing pain in left shoulder for over a year. Over the last few days two weeks she has experienced increased pain in the shoulder. The pain is constant but it is worse with movement. Today her daughter was applying Voltaren gel to the area and noticed that there was an area of induration to the shoulder. Is unclear how long that has been there. Patient denies any trauma's. She is right-hand dominant. She does have a history of diabetes and takes Pradaxa. Pain is located in the left shoulder and it radiates up to the neck. Past Medical History:  Diagnosis Date  . Acute pancreatitis 02/14/2017   Archie Endo 02/14/2017  . Arthritis    "knees, skeletal joints" (02/19/2017)  . Chronic lower back pain   . CKD (chronic kidney disease), stage IV ([place])    hx/notes 02/14/2017  . Coronary artery disease   . Fibromyalgia   . Hyperlipidemia    hx/notes 02/14/2017  . Hypertension   . PAF (paroxysmal atrial fibrillation) ([place])    hx/notes 02/14/2017  . Presence of permanent cardiac pacemaker   . Sleep apnea    "wore mask til ~ 18 months ago; not a problem now" (02/19/2017)  . Stroke [facility]) 2010   "got TPA: completely resolved" (02/19/2017)  . Type II diabetes mellitus ([place])    hx/notes 02/14/2017    Patient Active Problem List   Diagnosis Date Noted  . Type 2 diabetes mellitus  ([place]) 02/16/2017  . Acute kidney injury ([place]) 02/16/2017  . Essential hypertension 02/16/2017  . A-fib ([place]) 02/16/2017  . Acute gallstone pancreatitis 02/14/2017    Past Surgical History:  Procedure Laterality Date  . 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 AORTIC VALVE REPLACEMENT (AVR)/CORONARY ARTERY BYPASS GRAFTING (CABG)    . BACK SURGERY    . BREAST SURGERY     "had some calcified tissue removed"  . CARDIAC CATHETERIZATION  2014  . CARDIAC VALVE REPLACEMENT    . CHOLECYSTECTOMY N/A 02/22/2017   Procedure: LAPAROSCOPIC CHOLECYSTECTOMY WITH INTRAOPERATIVE CHOLANGIOGRAM;  Surgeon: Ralene Ok, MD;  Location: [facility];  Service: General;  Laterality: N/A;  . CORONARY ARTERY BYPASS GRAFT  2014   CABG X2  . ERCP  02/19/2017  . ERCP N/A 02/19/2017   Procedure: ENDOSCOPIC RETROGRADE CHOLANGIOPANCREATOGRAPHY (ERCP);  Surgeon: Clarene Essex, MD;  Location: [facility];  Service: Endoscopy;  Laterality: N/A;  . FIXATION KYPHOPLASTY LUMBAR SPINE    . INSERT / REPLACE / REMOVE PACEMAKER  04/05/2013   [employer] Serial# [id number], [id number], SERIAL [id number]  . KNEE ARTHROSCOPY Right    "torn meniscus"  . PARTIAL THYMECTOMY  1964     OB History   No obstetric history on file.      Home Medications    Prior to Admission medications   Medication Sig Start Date End Date Taking? Authorizing  Provider  Buprenorphine HCl (BELBUCA) 300 MCG FILM Place inside cheek. 03/20/19  Yes [provider]  dabigatran (PRADAXA) 75 MG CAPS capsule Take by mouth. 10/12/18  Yes [provider]  acetaminophen (TYLENOL) 325 MG tablet Take 650 mg by mouth every 6 (six) hours as needed for headache (pain).    [provider]  alendronate (FOSAMAX) 70 MG tablet Take 70 mg by mouth every Saturday. Take with a full glass of water on an empty stomach.    [provider]  amiodarone (PACERONE) 200 MG tablet Take 200 mg by mouth daily.    [provider]  amLODipine (NORVASC) 5 MG tablet Take 1 tablet (5 mg total) by mouth daily. 02/24/17   Ina Homes, MD  atorvastatin (LIPITOR) 40 MG tablet Take 40 mg by mouth daily.    [provider]  calcitRIOL (ROCALTROL) 0.5 MCG capsule Take 0.5 mcg by mouth daily.    [provider]  cetirizine (ZYRTEC) 10 MG tablet Take 10 mg by mouth daily as needed for allergies.    [provider]  Cholecalciferol (VITAMIN D) 2000 units tablet Take 2,000 Units by mouth daily.    [provider]  enalapril (VASOTEC) 10 MG tablet Take 1 tablet (10 mg total) by mouth daily. 02/24/17   Ina Homes, MD  enoxaparin (LOVENOX) 150 MG/ML injection Inject 0.93 mLs (140 mg total) into the skin daily. 02/24/17 02/24/18  Ina Homes, MD  EPINEPHrine (EPIPEN 2-PAK) 0.3 mg/0.3 mL IJ SOAJ injection Inject 0.3 mg into the muscle once as needed (severe allergic reaction).    [provider]  Insulin Detemir (LEVEMIR FLEXTOUCH) 100 UNIT/ML Pen Inject 18 Units into the skin 2 (two) times daily.    [provider]  Insulin Glulisine (APIDRA SOLOSTAR) 100 UNIT/ML Solostar Pen Inject 12 Units into the skin See admin instructions. Inject 12 units subcutaneously three times daily before meals - plus adjustment if CBG >200 - 1 unit for every 20    [provider]  metaxalone (SKELAXIN) 800 MG tablet Take 800 mg by mouth 3 (three) times daily as needed for muscle spasms.    [provider]  Methylfol-Methylcob-Acetylcyst (CEREFOLIN NAC) 6-2-600 MG TABS Take 1 tablet by mouth daily.    [provider]  metoprolol tartrate (LOPRESSOR) 25 MG tablet Take 25 mg by mouth 2 (two) times daily.    [provider]  mirtazapine (REMERON) 15 MG tablet Take 15 mg by mouth at bedtime.    [provider]  nitroGLYCERIN (NITROSTAT) 0.4 MG SL tablet Place 0.4 mg under the tongue every 5 (five) minutes as needed for chest pain.    [provider]  Omega-3 Fatty Acids (OMEGA 3 PO) Take 1 capsule by mouth daily.    [provider]  oxyCODONE-acetaminophen (PERCOCET/ROXICET) 5-325 MG tablet Take 1 tablet by mouth 2 (two) times daily as needed for severe pain.    [provider]  polyethylene glycol (MIRALAX / GLYCOLAX) packet Take 17 g by mouth daily. 02/24/17   Ina Homes, MD  QUEtiapine (SEROQUEL) 25 MG tablet Take 25 mg by mouth at bedtime.    [provider]  senna-docusate (SENOKOT-S) 8.6-50 MG tablet Take 2 tablets by mouth 2 (two) times daily. 02/23/17   Ina Homes, MD  warfarin (JANTOVEN) 4 MG tablet Take 2-4 mg by mouth See admin instructions. Take 1/2 tablet (2 mg) by mouth on Saturday at bedtime, take 1 tablet (4 mg) on all other nights  [provider]    Family History Family History  Problem Relation Age of Onset  . Hypertension Father     Social History Social History   Tobacco Use  . Smoking status: Never Smoker  . Smokeless tobacco: Never Used  Substance Use Topics  . Alcohol use: No  . Drug use: No     Allergies   Esomeprazole sodium, Penicillins, Clindamycin/lincomycin, Hydrocodone, Nsaids, Valsartan, and Prilosec [omeprazole]   Review of Systems Review of Systems  All other systems reviewed and are negative.    Physical Exam Updated Vital Signs BP (!) 115/56 (BP Location: Right Arm)   Pulse 66   Temp 98.1 F (36.7 C) (Oral)   Resp 16   Ht 5\' 4"  (1.626 m)   Wt 94.3 kg   SpO2 98%   BMI 35.70 kg/m   Physical Exam Vitals signs and nursing note reviewed.  Constitutional:      Appearance: She is well-developed.  HENT:     Head: Normocephalic and atraumatic.  Cardiovascular:     Rate and Rhythm: Normal rate and regular rhythm.     Heart sounds: No murmur.  Pulmonary:     Effort: Pulmonary effort is normal. No respiratory distress.     Breath sounds: Normal breath sounds.  Abdominal:     Palpations: Abdomen is soft.      Tenderness: There is no abdominal tenderness. There is no guarding or rebound.  Musculoskeletal:        General: No tenderness.     Comments: 2+ radial pulses bilaterally. There is tenderness to palpation over the left anterior shoulder joint. The left lateral shoulder does have an area of firmness and induration with overlying telangectasia and minimal ecchymosis  Area is approximately 3 by 4 cm in size. There is no erythema. Range of motion is preserved throughout the shoulder.  Skin:    General: Skin is warm and dry.  Neurological:     Mental Status: She is alert and oriented to person, place, and time.     Comments: Five out of five strength and bilateral upper extremities with sensation to light touch intact and bilateral upper extremities  Psychiatric:        Behavior: Behavior normal.      ED Treatments / Results  Labs (all labs ordered are listed, but only abnormal results are displayed) Labs Reviewed  BASIC METABOLIC PANEL - Abnormal; Notable for the following components:      Result Value   Glucose, Bld 65 (*)    BUN 32 (*)    Creatinine, Ser 2.12 (*)    GFR calc non Af Amer 21 (*)    GFR calc Af Amer 24 (*)    All other components within normal limits  PROTIME-INR - Abnormal; Notable for the following components:   Prothrombin Time 16.2 (*)    INR 1.3 (*)    All other components within normal limits  CBC WITH DIFFERENTIAL/PLATELET    EKG None  Radiology Dg Shoulder Left  Result Date: 07/01/2019 CLINICAL DATA:  Acute on chronic left shoulder pain with overlying hematoma. EXAM: LEFT SHOULDER - 2+ VIEW COMPARISON:  None. FINDINGS: There is no evidence of fracture or dislocation. An os effect fried meant near the greater tuberosity likely reflects chronic degenerative change. Soft tissue density overlying the shoulder likely represents a hematoma. Median sternotomy wires are noted. IMPRESSION: 1. No acute fracture or dislocation. 2. Soft tissue density overlying the  shoulder likely represents a hematoma. 3. Probable  chronic degenerative change at the greater tuberosity. Electronically Signed   By: Zerita Boers M.D.   On: 07/01/2019 17:08    Procedures Procedures (including critical care time)  Medications Ordered in ED Medications - No data to display   Initial Impression / Assessment and Plan / ED Course  I have reviewed the triage vital signs and the nursing notes.  Pertinent labs & imaging results that were available during my care of the patient were reviewed by me and considered in my medical decision making (see chart for details).       patient here for evaluation of atraumatic left shoulder pain. On examination she is non-toxic appearing, neurovascular intact. Exam is consistent with hematoma although she has no history of trauma. Swelling is located in the soft tissue area and not in the joint although it does overfly the joint. She is on Pradaxa but has no evidence of major hemorrhage. Hemoglobin is within normal limits. She has CKD and creatinine is near her baseline. Discussed with patient and daughter home care for shoulder pain, likely hematoma. Discussed PCP, orthopedics follow-up.  Final Clinical Impressions(s) / ED Diagnoses   Final diagnoses:  Acute pain of left shoulder  Hematoma    ED Discharge Orders    None       Quintella Reichert, MD 07/01/19 [phone]

## 2019-07-01 NOTE — ED Triage Notes (Signed)
Pts family member reports pain, swelling and redness to left shoulder x 2 weeks, with worsening x 2 days. Pt denies fever

## 2019-08-17 ENCOUNTER — Encounter (HOSPITAL_COMMUNITY): Payer: Self-pay | Admitting: Emergency Medicine

## 2019-08-17 ENCOUNTER — Other Ambulatory Visit: Payer: Self-pay

## 2019-08-17 ENCOUNTER — Emergency Department (HOSPITAL_COMMUNITY): Payer: Medicare Other

## 2019-08-17 ENCOUNTER — Inpatient Hospital Stay (HOSPITAL_COMMUNITY)
Admission: EM | Admit: 2019-08-17 | Discharge: 2019-08-20 | DRG: 177 | Disposition: A | Discharge status: Home Health | Payer: Medicare Other | Attending: Family Medicine | Admitting: Family Medicine

## 2019-08-17 DIAGNOSIS — G894 Chronic pain syndrome: Secondary | ICD-10-CM | POA: Diagnosis present

## 2019-08-17 DIAGNOSIS — E119 Type 2 diabetes mellitus without complications: Secondary | ICD-10-CM

## 2019-08-17 DIAGNOSIS — Z8673 Personal history of transient ischemic attack (TIA), and cerebral infarction without residual deficits: Secondary | ICD-10-CM | POA: Diagnosis not present

## 2019-08-17 DIAGNOSIS — Z880 Allergy status to penicillin: Secondary | ICD-10-CM | POA: Diagnosis not present

## 2019-08-17 DIAGNOSIS — N184 Chronic kidney disease, stage 4 (severe): Secondary | ICD-10-CM | POA: Diagnosis present

## 2019-08-17 DIAGNOSIS — M545 Low back pain: Secondary | ICD-10-CM | POA: Diagnosis present

## 2019-08-17 DIAGNOSIS — U071 COVID-19: Principal | ICD-10-CM | POA: Diagnosis present

## 2019-08-17 DIAGNOSIS — G9341 Metabolic encephalopathy: Secondary | ICD-10-CM | POA: Diagnosis present

## 2019-08-17 DIAGNOSIS — Z7901 Long term (current) use of anticoagulants: Secondary | ICD-10-CM

## 2019-08-17 DIAGNOSIS — E1165 Type 2 diabetes mellitus with hyperglycemia: Secondary | ICD-10-CM | POA: Diagnosis not present

## 2019-08-17 DIAGNOSIS — G9340 Encephalopathy, unspecified: Secondary | ICD-10-CM | POA: Diagnosis not present

## 2019-08-17 DIAGNOSIS — I10 Essential (primary) hypertension: Secondary | ICD-10-CM | POA: Diagnosis not present

## 2019-08-17 DIAGNOSIS — E785 Hyperlipidemia, unspecified: Secondary | ICD-10-CM | POA: Diagnosis present

## 2019-08-17 DIAGNOSIS — N179 Acute kidney failure, unspecified: Secondary | ICD-10-CM | POA: Diagnosis present

## 2019-08-17 DIAGNOSIS — E860 Dehydration: Secondary | ICD-10-CM | POA: Diagnosis present

## 2019-08-17 DIAGNOSIS — J9601 Acute respiratory failure with hypoxia: Secondary | ICD-10-CM | POA: Diagnosis present

## 2019-08-17 DIAGNOSIS — I4891 Unspecified atrial fibrillation: Secondary | ICD-10-CM | POA: Diagnosis present

## 2019-08-17 DIAGNOSIS — Z7983 Long term (current) use of bisphosphonates: Secondary | ICD-10-CM | POA: Diagnosis not present

## 2019-08-17 DIAGNOSIS — Z794 Long term (current) use of insulin: Secondary | ICD-10-CM | POA: Diagnosis not present

## 2019-08-17 DIAGNOSIS — F329 Major depressive disorder, single episode, unspecified: Secondary | ICD-10-CM | POA: Diagnosis present

## 2019-08-17 DIAGNOSIS — I2510 Atherosclerotic heart disease of native coronary artery without angina pectoris: Secondary | ICD-10-CM | POA: Diagnosis present

## 2019-08-17 DIAGNOSIS — I129 Hypertensive chronic kidney disease with stage 1 through stage 4 chronic kidney disease, or unspecified chronic kidney disease: Secondary | ICD-10-CM | POA: Diagnosis present

## 2019-08-17 DIAGNOSIS — I480 Paroxysmal atrial fibrillation: Secondary | ICD-10-CM | POA: Diagnosis present

## 2019-08-17 DIAGNOSIS — Z888 Allergy status to other drugs, medicaments and biological substances status: Secondary | ICD-10-CM | POA: Diagnosis not present

## 2019-08-17 DIAGNOSIS — G4733 Obstructive sleep apnea (adult) (pediatric): Secondary | ICD-10-CM | POA: Diagnosis present

## 2019-08-17 DIAGNOSIS — N390 Urinary tract infection, site not specified: Secondary | ICD-10-CM | POA: Diagnosis present

## 2019-08-17 DIAGNOSIS — Z79899 Other long term (current) drug therapy: Secondary | ICD-10-CM

## 2019-08-17 DIAGNOSIS — Z885 Allergy status to narcotic agent status: Secondary | ICD-10-CM

## 2019-08-17 DIAGNOSIS — Z8249 Family history of ischemic heart disease and other diseases of the circulatory system: Secondary | ICD-10-CM

## 2019-08-17 DIAGNOSIS — R4182 Altered mental status, unspecified: Secondary | ICD-10-CM

## 2019-08-17 DIAGNOSIS — T380X5A Adverse effect of glucocorticoids and synthetic analogues, initial encounter: Secondary | ICD-10-CM | POA: Diagnosis not present

## 2019-08-17 DIAGNOSIS — E1122 Type 2 diabetes mellitus with diabetic chronic kidney disease: Secondary | ICD-10-CM | POA: Diagnosis present

## 2019-08-17 DIAGNOSIS — Z951 Presence of aortocoronary bypass graft: Secondary | ICD-10-CM | POA: Diagnosis not present

## 2019-08-17 DIAGNOSIS — J9600 Acute respiratory failure, unspecified whether with hypoxia or hypercapnia: Secondary | ICD-10-CM | POA: Diagnosis not present

## 2019-08-17 DIAGNOSIS — M170 Bilateral primary osteoarthritis of knee: Secondary | ICD-10-CM | POA: Diagnosis present

## 2019-08-17 DIAGNOSIS — J189 Pneumonia, unspecified organism: Secondary | ICD-10-CM | POA: Insufficient documentation

## 2019-08-17 LAB — URINALYSIS, COMPLETE (UACMP) WITH MICROSCOPIC
Bilirubin Urine: NEGATIVE
Glucose, UA: 50 mg/dL — AB
Hgb urine dipstick: NEGATIVE
Ketones, ur: NEGATIVE mg/dL
Nitrite: NEGATIVE
Protein, ur: NEGATIVE mg/dL
Specific Gravity, Urine: 1.016 (ref 1.005–1.030)
pH: 5 (ref 5.0–8.0)

## 2019-08-17 LAB — CBC WITH DIFFERENTIAL/PLATELET
Abs Immature Granulocytes: 0.01 10*3/uL (ref 0.00–0.07)
Basophils Absolute: 0 10*3/uL (ref 0.0–0.1)
Basophils Relative: 0 %
Eosinophils Absolute: 0 10*3/uL (ref 0.0–0.5)
Eosinophils Relative: 0 %
HCT: 37.1 % (ref 36.0–46.0)
Hemoglobin: 12 g/dL (ref 12.0–15.0)
Immature Granulocytes: 0 %
Lymphocytes Relative: 15 %
Lymphs Abs: 0.5 10*3/uL — ABNORMAL LOW (ref 0.7–4.0)
MCH: 30.8 pg (ref 26.0–34.0)
MCHC: 32.3 g/dL (ref 30.0–36.0)
MCV: 95.4 fL (ref 80.0–100.0)
Monocytes Absolute: 0.3 10*3/uL (ref 0.1–1.0)
Monocytes Relative: 9 %
Neutro Abs: 2.3 10*3/uL (ref 1.7–7.7)
Neutrophils Relative %: 76 %
Platelets: 138 10*3/uL — ABNORMAL LOW (ref 150–400)
RBC: 3.89 MIL/uL (ref 3.87–5.11)
RDW: 14.1 % (ref 11.5–15.5)
WBC: 3 10*3/uL — ABNORMAL LOW (ref 4.0–10.5)
nRBC: 0 % (ref 0.0–0.2)

## 2019-08-17 LAB — CBC
HCT: 38.4 % (ref 36.0–46.0)
Hemoglobin: 12 g/dL (ref 12.0–15.0)
MCH: 30.4 pg (ref 26.0–34.0)
MCHC: 31.3 g/dL (ref 30.0–36.0)
MCV: 97.2 fL (ref 80.0–100.0)
Platelets: 154 10*3/uL (ref 150–400)
RBC: 3.95 MIL/uL (ref 3.87–5.11)
RDW: 14.1 % (ref 11.5–15.5)
WBC: 3.6 10*3/uL — ABNORMAL LOW (ref 4.0–10.5)
nRBC: 0 % (ref 0.0–0.2)

## 2019-08-17 LAB — COMPREHENSIVE METABOLIC PANEL
ALT: 24 U/L (ref 0–44)
AST: 37 U/L (ref 15–41)
Albumin: 3.4 g/dL — ABNORMAL LOW (ref 3.5–5.0)
Alkaline Phosphatase: 52 U/L (ref 38–126)
Anion gap: 14 (ref 5–15)
BUN: 26 mg/dL — ABNORMAL HIGH (ref 8–23)
CO2: 21 mmol/L — ABNORMAL LOW (ref 22–32)
Calcium: 9.5 mg/dL (ref 8.9–10.3)
Chloride: 101 mmol/L (ref 98–111)
Creatinine, Ser: 2.02 mg/dL — ABNORMAL HIGH (ref 0.44–1.00)
GFR calc Af Amer: 26 mL/min — ABNORMAL LOW (ref >60)
GFR calc non Af Amer: 22 mL/min — ABNORMAL LOW (ref >60)
Glucose, Bld: 271 mg/dL — ABNORMAL HIGH (ref 70–99)
Potassium: 4.5 mmol/L (ref 3.5–5.1)
Sodium: 136 mmol/L (ref 135–145)
Total Bilirubin: 0.9 mg/dL (ref 0.3–1.2)
Total Protein: 6.6 g/dL (ref 6.5–8.1)

## 2019-08-17 LAB — LACTIC ACID, PLASMA: Lactic Acid, Venous: 1.5 mmol/L (ref 0.5–1.9)

## 2019-08-17 LAB — PROTIME-INR
INR: 1 (ref 0.8–1.2)
Prothrombin Time: 13.3 seconds (ref 11.4–15.2)

## 2019-08-17 LAB — CBG MONITORING, ED: Glucose-Capillary: 238 mg/dL — ABNORMAL HIGH (ref 70–99)

## 2019-08-17 LAB — TSH: TSH: 2.665 u[IU]/mL (ref 0.350–4.500)

## 2019-08-17 LAB — AMMONIA: Ammonia: 9 umol/L (ref 9–35)

## 2019-08-17 LAB — D-DIMER, QUANTITATIVE: D-Dimer, Quant: <0.27 ug/mL-FEU (ref 0.00–0.50)

## 2019-08-17 LAB — PROCALCITONIN: Procalcitonin: <0.1 ng/mL

## 2019-08-17 LAB — APTT: aPTT: 39 seconds — ABNORMAL HIGH (ref 24–36)

## 2019-08-17 MED ORDER — LEVOFLOXACIN IN D5W 750 MG/150ML IV SOLN
750.0000 mg | Freq: Once | INTRAVENOUS | Status: AC
  Administered 2019-08-17: 750 mg via INTRAVENOUS
  Filled 2019-08-17: qty 150

## 2019-08-17 MED ORDER — LACTATED RINGERS IV BOLUS (SEPSIS)
500.0000 mL | Freq: Once | INTRAVENOUS | Status: AC
  Administered 2019-08-17: 500 mL via INTRAVENOUS

## 2019-08-17 NOTE — ED Provider Notes (Signed)
[facility] EMERGENCY DEPARTMENT Provider Note   CSN: [id number] Arrival date & time: 08/17/19  1428     History Chief Complaint  Patient presents with  . Altered Mental Status    Daisy Pearson is a 86 y.o. female.  HPI 86 year old female with history of CKD, CAD, hyperlipidemia, A. fib currently on anticoagulation, pacemaker, TIA, diabetes, presenting to the emergency department for altered mental status over the past 3 to 4 days.  Per daughter, patient's appetite has decreased, increased lethargy, malaise and fatigue.  Low-grade fever at home of 100.4, no cough or shortness of breath, no diarrhea, nausea or vomiting.  More forgetfulness as well over the past few days.  No urinary symptoms or abdominal pain, no focal neurological symptoms, daughter denies any trouble ambulating or using extremities.  No falls or trauma, no sick contacts or recent travel.    Past Medical History:  Diagnosis Date  . Acute pancreatitis 02/14/2017   Archie Endo 02/14/2017  . Arthritis    "knees, skeletal joints" (02/19/2017)  . Chronic lower back pain   . CKD (chronic kidney disease), stage IV ([place])    hx/notes 02/14/2017  . Coronary artery disease   . Fibromyalgia   . Hyperlipidemia    hx/notes 02/14/2017  . Hypertension   . PAF (paroxysmal atrial fibrillation) ([place])    hx/notes 02/14/2017  . Presence of permanent cardiac pacemaker   . Sleep apnea    "wore mask til ~ 18 months ago; not a problem now" (02/19/2017)  . Stroke [facility]) 2010   "got TPA: completely resolved" (02/19/2017)  . Type II diabetes mellitus ([place])    hx/notes 02/14/2017    Patient Active Problem List   Diagnosis Date Noted  . Type 2 diabetes mellitus ([place]) 02/16/2017  . Acute kidney injury ([place]) 02/16/2017  . Essential hypertension 02/16/2017  . A-fib ([place]) 02/16/2017  . Acute gallstone pancreatitis 02/14/2017    Past Surgical History:  Procedure Laterality Date  . 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 AORTIC VALVE REPLACEMENT (AVR)/CORONARY ARTERY BYPASS GRAFTING (CABG)    . BACK SURGERY    . BREAST SURGERY     "had some calcified tissue removed"  . CARDIAC CATHETERIZATION  2014  . CARDIAC VALVE REPLACEMENT    . CHOLECYSTECTOMY N/A 02/22/2017   Procedure: LAPAROSCOPIC CHOLECYSTECTOMY WITH INTRAOPERATIVE CHOLANGIOGRAM;  Surgeon: Ralene Ok, MD;  Location: [facility];  Service: General;  Laterality: N/A;  . CORONARY ARTERY BYPASS GRAFT  2014   CABG X2  . ERCP  02/19/2017  . ERCP N/A 02/19/2017   Procedure: ENDOSCOPIC RETROGRADE CHOLANGIOPANCREATOGRAPHY (ERCP);  Surgeon: Clarene Essex, MD;  Location: [facility];  Service: Endoscopy;  Laterality: N/A;  . FIXATION KYPHOPLASTY LUMBAR SPINE    . INSERT / REPLACE / REMOVE PACEMAKER  04/05/2013   [employer] Serial# [id number], [id number], SERIAL [id number]  . KNEE ARTHROSCOPY Right    "torn meniscus"  . PARTIAL THYMECTOMY  1964     OB History   No obstetric history on file.     Family History  Problem Relation Age of Onset  . Hypertension Father     Social History   Tobacco Use  . Smoking status: Never Smoker  . Smokeless tobacco: Never Used  Substance Use Topics  . Alcohol use: No  . Drug use: No    Home Medications Prior to Admission medications   Medication Sig Start Date End Date Taking? Authorizing Provider  acetaminophen (TYLENOL) 325 MG tablet Take 650 mg  by mouth every 6 (six) hours as needed for headache (pain).    [provider]  alendronate (FOSAMAX) 70 MG tablet Take 70 mg by mouth every Saturday. Take with a full glass of water on an empty stomach.    [provider]  amiodarone (PACERONE) 200 MG tablet Take 200 mg by mouth daily.    [provider]  amLODipine (NORVASC) 5 MG tablet Take 1 tablet (5 mg total) by mouth daily. 02/24/17   Ina Homes, MD  atorvastatin (LIPITOR) 40 MG tablet Take 40 mg by mouth daily.    [provider]    Buprenorphine HCl (BELBUCA) 300 MCG FILM Place inside cheek. 03/20/19   [provider]  calcitRIOL (ROCALTROL) 0.5 MCG capsule Take 0.5 mcg by mouth daily.    [provider]  cetirizine (ZYRTEC) 10 MG tablet Take 10 mg by mouth daily as needed for allergies.    [provider]  Cholecalciferol (VITAMIN D) 2000 units tablet Take 2,000 Units by mouth daily.    [provider]  dabigatran (PRADAXA) 75 MG CAPS capsule Take by mouth. 10/12/18   [provider]  enalapril (VASOTEC) 10 MG tablet Take 1 tablet (10 mg total) by mouth daily. 02/24/17   Ina Homes, MD  enoxaparin (LOVENOX) 150 MG/ML injection Inject 0.93 mLs (140 mg total) into the skin daily. 02/24/17 02/24/18  Ina Homes, MD  EPINEPHrine (EPIPEN 2-PAK) 0.3 mg/0.3 mL IJ SOAJ injection Inject 0.3 mg into the muscle once as needed (severe allergic reaction).    [provider]  Insulin Detemir (LEVEMIR FLEXTOUCH) 100 UNIT/ML Pen Inject 18 Units into the skin 2 (two) times daily.    [provider]  Insulin Glulisine (APIDRA SOLOSTAR) 100 UNIT/ML Solostar Pen Inject 12 Units into the skin See admin instructions. Inject 12 units subcutaneously three times daily before meals - plus adjustment if CBG >200 - 1 unit for every 20    [provider]  metaxalone (SKELAXIN) 800 MG tablet Take 800 mg by mouth 3 (three) times daily as needed for muscle spasms.    [provider]  Methylfol-Methylcob-Acetylcyst (CEREFOLIN NAC) 6-2-600 MG TABS Take 1 tablet by mouth daily.    [provider]  metoprolol tartrate (LOPRESSOR) 25 MG tablet Take 25 mg by mouth 2 (two) times daily.    [provider]  mirtazapine (REMERON) 15 MG tablet Take 15 mg by mouth at bedtime.    [provider]  nitroGLYCERIN (NITROSTAT) 0.4 MG SL tablet Place 0.4 mg under the tongue every 5 (five) minutes as needed for chest pain.    [provider]  Omega-3 Fatty  Acids (OMEGA 3 PO) Take 1 capsule by mouth daily.    [provider]  oxyCODONE-acetaminophen (PERCOCET/ROXICET) 5-325 MG tablet Take 1 tablet by mouth 2 (two) times daily as needed for severe pain.    [provider]  polyethylene glycol (MIRALAX / GLYCOLAX) packet Take 17 g by mouth daily. 02/24/17   Ina Homes, MD  QUEtiapine (SEROQUEL) 25 MG tablet Take 25 mg by mouth at bedtime.    [provider]  senna-docusate (SENOKOT-S) 8.6-50 MG tablet Take 2 tablets by mouth 2 (two) times daily. 02/23/17   Ina Homes, MD  warfarin (JANTOVEN) 4 MG tablet Take 2-4 mg by mouth See admin instructions. Take 1/2 tablet (2 mg) by mouth on Saturday at bedtime, take 1 tablet (4 mg) on all other nights    [provider]    Allergies  Esomeprazole sodium, Penicillins, Clindamycin/lincomycin, Hydrocodone, Nsaids, Valsartan, Hydrocodone-acetaminophen, and Prilosec [omeprazole]  Review of Systems   Review of Systems  Constitutional: Positive for activity change and fatigue. Negative for chills and fever.  HENT: Negative for ear pain and sore throat.   Eyes: Negative for pain and visual disturbance.  Respiratory: Negative for cough and shortness of breath.   Cardiovascular: Negative for chest pain and palpitations.  Gastrointestinal: Negative for abdominal pain and vomiting.  Genitourinary: Negative for dysuria and hematuria.  Musculoskeletal: Negative for arthralgias and back pain.  Skin: Negative for color change and rash.  Neurological: Negative for seizures and syncope.  Psychiatric/Behavioral: Positive for confusion and decreased concentration.  All other systems reviewed and are negative.   Physical Exam Updated Vital Signs BP (!) 151/96 (BP Location: Right Arm) Comment: Simultaneous filing. User may not have seen previous data.  Pulse 68 Comment: Simultaneous filing. User may not have seen previous data.  Temp 98.2 F (36.8 C) (Oral)   Resp 20   Ht  5\' 3"  (1.6 m)   Wt 96.6 kg   SpO2 97% Comment: Simultaneous filing. User may not have seen previous data.  BMI 37.73 kg/m   Physical Exam Vitals and nursing note reviewed.  Constitutional:      General: She is not in acute distress.    Appearance: Normal appearance. She is well-developed. She is not ill-appearing, toxic-appearing or diaphoretic.     Comments: Elderly, frail, cachectic appearing  HENT:     Head: Normocephalic and atraumatic.     Right Ear: External ear normal.     Left Ear: External ear normal.     Nose: Nose normal.  Eyes:     Conjunctiva/sclera: Conjunctivae normal.  Cardiovascular:     Rate and Rhythm: Normal rate and regular rhythm.     Heart sounds: No murmur.  Pulmonary:     Effort: Pulmonary effort is normal. No respiratory distress.     Breath sounds: Normal breath sounds.  Abdominal:     Palpations: Abdomen is soft.     Tenderness: There is no abdominal tenderness. There is no guarding or rebound.     Hernia: No hernia is present.  Musculoskeletal:        General: No swelling or tenderness.     Cervical back: Normal range of motion and neck supple. No rigidity.  Skin:    General: Skin is warm and dry.  Neurological:     General: No focal deficit present.     Mental Status: She is alert and oriented to person, place, and time.     Cranial Nerves: No cranial nerve deficit.     Sensory: No sensory deficit.     Motor: No weakness.     Coordination: Coordination normal.  Psychiatric:        Mood and Affect: Mood normal.        Behavior: Behavior normal.     ED Results / Procedures / Treatments   Labs (all labs ordered are listed, but only abnormal results are displayed) Labs Reviewed  COMPREHENSIVE METABOLIC PANEL - Abnormal; Notable for the following components:      Result Value   CO2 21 (*)    Glucose, Bld 271 (*)    BUN 26 (*)    Creatinine, Ser 2.02 (*)    Albumin 3.4 (*)    GFR calc non Af Amer 22 (*)    GFR calc Af Amer 26 (*)     All other components within normal  limits  CBC - Abnormal; Notable for the following components:   WBC 3.6 (*)    All other components within normal limits  APTT - Abnormal; Notable for the following components:   aPTT 39 (*)    All other components within normal limits  CBC WITH DIFFERENTIAL/PLATELET - Abnormal; Notable for the following components:   WBC 3.0 (*)    Platelets 138 (*)    Lymphs Abs 0.5 (*)    All other components within normal limits  URINALYSIS, COMPLETE (UACMP) WITH MICROSCOPIC - Abnormal; Notable for the following components:   APPearance HAZY (*)    Glucose, UA 50 (*)    Leukocytes,Ua TRACE (*)    Bacteria, UA FEW (*)    All other components within normal limits  CBG MONITORING, ED - Abnormal; Notable for the following components:   Glucose-Capillary 238 (*)    All other components within normal limits  SARS CORONAVIRUS 2 (TAT 6-24 HRS)  CULTURE, BLOOD (ROUTINE X 2)  CULTURE, BLOOD (ROUTINE X 2)  URINE CULTURE  PROTIME-INR  TSH  AMMONIA  LACTIC ACID, PLASMA  D-DIMER, QUANTITATIVE (NOT AT [facility])  PROCALCITONIN  LACTIC ACID, PLASMA  LACTIC ACID, PLASMA  LACTIC ACID, PLASMA    EKG EKG Interpretation  Date/Time:  Thursday August 17 2019 14:48:39 EST Ventricular Rate:  65 PR Interval:  212 QRS Duration: 84 QT Interval:  454 QTC Calculation: 472 R Axis:   27 Text Interpretation: Atrial-paced rhythm with prolonged AV conduction Possible Inferior infarct , age undetermined Abnormal ECG No significant change since last tracing Confirmed by Wandra Arthurs [phone]) on 08/17/2019 8:32:46 PM   Radiology DG Chest 2 View  Result Date: 08/17/2019 CLINICAL DATA:  Confusion and lethargy.  Altered mental status. EXAM: CHEST - 2 VIEW COMPARISON:  02/14/2017 FINDINGS: Low lung volumes. Focal airspace disease in the right mid lung is compatible with pneumonia. There is a small nodular opacity in the left mid lung superimposed on the anterior second rib. Interstitial  markings are diffusely coarsened with chronic features. The cardio pericardial silhouette is enlarged. Right permanent pacemaker noted. The visualized bony structures of the thorax are intact. Status post vertebral augmentation in the region of the thoracolumbar junction. IMPRESSION: Focal airspace disease right mid lung with focal nodular opacity in the left mid lung. Features likely reflect multifocal pneumonia, but close follow-up recommended to ensure resolution. Electronically Signed   By: Misty Stanley M.D.   On: 08/17/2019 15:41   CT Head Wo Contrast  Result Date: 08/17/2019 CLINICAL DATA:  86 year old female with concern for intracranial hemorrhage. EXAM: CT HEAD WITHOUT CONTRAST TECHNIQUE: Contiguous axial images were obtained from the base of the skull through the vertex without intravenous contrast. COMPARISON:  None. FINDINGS: Brain: Moderate age-related atrophy and chronic microvascular ischemic changes. There is no acute intracranial hemorrhage. No mass effect or midline shift. No extra-axial fluid collection. Vascular: No hyperdense vessel or unexpected calcification. Skull: Normal. Negative for fracture or focal lesion. Sinuses/Orbits: No acute finding. Other: None IMPRESSION: 1. No acute intracranial hemorrhage. 2. Age-related atrophy and chronic microvascular ischemic changes. Electronically Signed   By: Anner Crete M.D.   On: 08/17/2019 22:28    Procedures Procedures (including critical care time)  Medications Ordered in ED Medications  lactated ringers bolus 500 mL (500 mLs Intravenous New Bag/Given 08/17/19 2151)  levofloxacin (LEVAQUIN) IVPB 750 mg (has no administration in time range)    ED Course  I have reviewed the triage vital signs and the nursing notes.  Pertinent labs & imaging results that were available during my care of the patient were reviewed by me and considered in my medical decision making (see chart for details).    MDM Rules/Calculators/A&P                       86 year old female presenting with altered mental status.  On arrival hemodynamically stable, afebrile.  Patient elderly and frail-appearing but otherwise nontoxic.  Patient has a nonfocal neurological exam, otherwise physical exam largely benign, see physical exam above.  Chest x-ray consistent with a right middle lobe pneumonia, given Levaquin in the ED.  Patient's kidney function on her CMP is mildly elevated from her past readings, likely secondary to decreased p.o. intake.  Per patient's daughter, patient is currently not at her baseline, more drowsy than usual and not answering questions appropriately.  Given her altered mental status as well as her pneumonia, Covid swab was sent as well as CT head was obtained.  Pending at this time.  Patient need to be admitted to the hospital for her pneumonia as well as her altered mental status. CT head reassuring. Otherwise labs reassuring as well. Pending COVID swab.  The attending physician was present and available for all medical decision making and procedures related to this patient's care.  Final Clinical Impression(s) / ED Diagnoses Final diagnoses:  Altered mental status, unspecified altered mental status type  Dehydration  Community acquired pneumonia of right middle lobe of lung    Rx / DC Orders ED Discharge Orders    None       Kizzie Fantasia, MD 08/17/19 2239    Drenda Freeze, MD 08/17/19 2300

## 2019-08-17 NOTE — ED Notes (Signed)
Pt's family stated she feels like Pt's vision is getting worse.

## 2019-08-17 NOTE — ED Triage Notes (Signed)
Pt arrives with daughter who reports increase in confusion for 2 days and increased lethargy for 4 days. Reports pt has been shaking and not eating much for about 4 days. Hx of CVA with no deficits. Denies any falls.

## 2019-08-18 ENCOUNTER — Encounter (HOSPITAL_COMMUNITY): Payer: Self-pay | Admitting: Internal Medicine

## 2019-08-18 DIAGNOSIS — J9600 Acute respiratory failure, unspecified whether with hypoxia or hypercapnia: Secondary | ICD-10-CM

## 2019-08-18 DIAGNOSIS — N179 Acute kidney failure, unspecified: Secondary | ICD-10-CM | POA: Diagnosis present

## 2019-08-18 DIAGNOSIS — G9340 Encephalopathy, unspecified: Secondary | ICD-10-CM | POA: Diagnosis present

## 2019-08-18 DIAGNOSIS — U071 COVID-19: Principal | ICD-10-CM

## 2019-08-18 LAB — GLUCOSE, CAPILLARY
Glucose-Capillary: 294 mg/dL — ABNORMAL HIGH (ref 70–99)
Glucose-Capillary: 351 mg/dL — ABNORMAL HIGH (ref 70–99)

## 2019-08-18 LAB — CBG MONITORING, ED
Glucose-Capillary: 215 mg/dL — ABNORMAL HIGH (ref 70–99)
Glucose-Capillary: 293 mg/dL — ABNORMAL HIGH (ref 70–99)

## 2019-08-18 LAB — SARS CORONAVIRUS 2 (TAT 6-24 HRS): SARS Coronavirus 2: POSITIVE — AB

## 2019-08-18 LAB — RESPIRATORY PANEL BY RT PCR (FLU A&B, COVID)
Influenza A by PCR: NEGATIVE
Influenza B by PCR: NEGATIVE
SARS Coronavirus 2 by RT PCR: POSITIVE — AB

## 2019-08-18 LAB — C-REACTIVE PROTEIN: CRP: 1.7 mg/dL — ABNORMAL HIGH (ref <1.0)

## 2019-08-18 LAB — D-DIMER, QUANTITATIVE: D-Dimer, Quant: 0.41 ug/mL-FEU (ref 0.00–0.50)

## 2019-08-18 LAB — FERRITIN: Ferritin: 65 ng/mL (ref 11–307)

## 2019-08-18 LAB — TROPONIN I (HIGH SENSITIVITY)
Troponin I (High Sensitivity): 16 ng/L (ref <18)
Troponin I (High Sensitivity): 17 ng/L (ref <18)

## 2019-08-18 MED ORDER — DABIGATRAN ETEXILATE MESYLATE 75 MG PO CAPS
75.0000 mg | ORAL_CAPSULE | Freq: Two times a day (BID) | ORAL | Status: DC
  Administered 2019-08-18 – 2019-08-20 (×5): 75 mg via ORAL
  Filled 2019-08-18 (×6): qty 1

## 2019-08-18 MED ORDER — ONDANSETRON HCL 4 MG PO TABS: 4.0000 mg | ORAL_TABLET | Freq: Four times a day (QID) | ORAL | Status: DC | PRN

## 2019-08-18 MED ORDER — DEXAMETHASONE SODIUM PHOSPHATE 10 MG/ML IJ SOLN
6.0000 mg | INTRAMUSCULAR | Status: DC
  Administered 2019-08-18 – 2019-08-19 (×2): 6 mg via INTRAVENOUS
  Filled 2019-08-18 (×3): qty 1

## 2019-08-18 MED ORDER — QUETIAPINE FUMARATE 25 MG PO TABS
25.0000 mg | ORAL_TABLET | Freq: Every day | ORAL | Status: DC
  Administered 2019-08-18 – 2019-08-19 (×2): 25 mg via ORAL
  Filled 2019-08-18 (×2): qty 1

## 2019-08-18 MED ORDER — AMLODIPINE BESYLATE 5 MG PO TABS
5.0000 mg | ORAL_TABLET | Freq: Every day | ORAL | Status: DC
  Administered 2019-08-18 – 2019-08-20 (×3): 5 mg via ORAL
  Filled 2019-08-18 (×3): qty 1

## 2019-08-18 MED ORDER — INSULIN ASPART 100 UNIT/ML ~~LOC~~ SOLN
0.0000 [IU] | Freq: Three times a day (TID) | SUBCUTANEOUS | Status: DC
  Administered 2019-08-18 (×2): 5 [IU] via SUBCUTANEOUS
  Administered 2019-08-18: 9 [IU] via SUBCUTANEOUS
  Administered 2019-08-19: 10:00:00 2 [IU] via SUBCUTANEOUS
  Administered 2019-08-19 (×2): 7 [IU] via SUBCUTANEOUS
  Administered 2019-08-20: 09:00:00 2 [IU] via SUBCUTANEOUS

## 2019-08-18 MED ORDER — SODIUM CHLORIDE 0.9 % IV SOLN
200.0000 mg | Freq: Once | INTRAVENOUS | Status: AC
  Administered 2019-08-18: 200 mg via INTRAVENOUS
  Filled 2019-08-18: qty 200

## 2019-08-18 MED ORDER — AMIODARONE HCL 100 MG PO TABS
200.0000 mg | ORAL_TABLET | Freq: Every day | ORAL | Status: DC
  Administered 2019-08-18 – 2019-08-20 (×3): 200 mg via ORAL
  Filled 2019-08-18 (×3): qty 2

## 2019-08-18 MED ORDER — ACETAMINOPHEN 325 MG PO TABS: 650.0000 mg | ORAL_TABLET | Freq: Four times a day (QID) | ORAL | Status: DC | PRN

## 2019-08-18 MED ORDER — MIRTAZAPINE 15 MG PO TABS
15.0000 mg | ORAL_TABLET | Freq: Every day | ORAL | Status: DC
  Administered 2019-08-18 – 2019-08-19 (×2): 15 mg via ORAL
  Filled 2019-08-18 (×2): qty 1

## 2019-08-18 MED ORDER — SODIUM CHLORIDE 0.9 % IV SOLN
100.0000 mg | Freq: Every day | INTRAVENOUS | Status: DC
  Administered 2019-08-19: 100 mg via INTRAVENOUS
  Filled 2019-08-18 (×2): qty 20

## 2019-08-18 MED ORDER — INSULIN DETEMIR 100 UNIT/ML ~~LOC~~ SOLN
18.0000 [IU] | Freq: Two times a day (BID) | SUBCUTANEOUS | Status: DC
  Administered 2019-08-18 – 2019-08-20 (×5): 18 [IU] via SUBCUTANEOUS
  Filled 2019-08-18 (×6): qty 0.18

## 2019-08-18 MED ORDER — BUPRENORPHINE HCL 450 MCG BU FILM
450.0000 ug | ORAL_FILM | Freq: Two times a day (BID) | BUCCAL | Status: DC
  Administered 2019-08-18: 300 ug via BUCCAL
  Administered 2019-08-19: 450 ug via BUCCAL
  Filled 2019-08-18 (×3): qty 1

## 2019-08-18 MED ORDER — ACETAMINOPHEN 650 MG RE SUPP: 650.0000 mg | Freq: Four times a day (QID) | RECTAL | Status: DC | PRN

## 2019-08-18 MED ORDER — ONDANSETRON HCL 4 MG/2ML IJ SOLN: 4.0000 mg | Freq: Four times a day (QID) | INTRAMUSCULAR | Status: DC | PRN

## 2019-08-18 MED ORDER — ATORVASTATIN CALCIUM 40 MG PO TABS
40.0000 mg | ORAL_TABLET | Freq: Every day | ORAL | Status: DC
  Administered 2019-08-18 – 2019-08-20 (×3): 40 mg via ORAL
  Filled 2019-08-18 (×3): qty 1

## 2019-08-18 MED ORDER — METAXALONE 800 MG PO TABS
800.0000 mg | ORAL_TABLET | Freq: Three times a day (TID) | ORAL | Status: DC | PRN
  Filled 2019-08-18 (×2): qty 1

## 2019-08-18 MED ORDER — SODIUM CHLORIDE 0.45 % IV SOLN: INTRAVENOUS | Status: DC

## 2019-08-18 MED ORDER — METOPROLOL TARTRATE 25 MG PO TABS
25.0000 mg | ORAL_TABLET | Freq: Two times a day (BID) | ORAL | Status: DC
  Administered 2019-08-18 – 2019-08-20 (×5): 25 mg via ORAL
  Filled 2019-08-18 (×5): qty 1

## 2019-08-18 MED ORDER — NITROGLYCERIN 0.4 MG SL SUBL: 0.4000 mg | SUBLINGUAL_TABLET | SUBLINGUAL | Status: DC | PRN

## 2019-08-18 MED ORDER — VITAMIN D 25 MCG (1000 UNIT) PO TABS
2000.0000 [IU] | ORAL_TABLET | Freq: Every day | ORAL | Status: DC
  Administered 2019-08-18 – 2019-08-20 (×3): 2000 [IU] via ORAL
  Filled 2019-08-18 (×3): qty 2

## 2019-08-18 NOTE — Progress Notes (Signed)
Pharmacy Medication Storage Note  Storing home medications for Daisy Pearson in pharmacy secured storage.   Medication storage bag number: [id number]  Delivered to pharmacy @ 1300 08/18/19  Medications will be returned to patient/caregiver upon discharge.  Daisy Pearson 08/18/19 1:49 PM

## 2019-08-18 NOTE — Plan of Care (Signed)
x

## 2019-08-18 NOTE — Progress Notes (Signed)
Patient admitted to the hospital earlier this morning by Dr. Hal Hope  Patient seen and examined. She knows that she is in the hospital, but does not know the date. She does not know why she came to the hospital. She does not have any focal neurologic deficits. She has crackles at the right lung base. Remainder of exam is unremarkable  A/P:  1. COVID 19 PNA. Chest xray showed bilateral infiltrates. Started on remdesivir and IV steroids. Follow inflammatory markers 2. Acute encephalopathy. Likely related to dehydration from decreased po intake. CT head negative. Continue to follow 3. AKI on CKD3. Baseline creatinine around 1.2. On presentation, creatinine found to be 2.0. Started on gentle hydration. Enalapril currently on hold. Continue to follow renal function 4. Insulin dependent Diabetes Mellitus. Chronically on levemir and apidra. Blood sugars elevated related to steroids. Continue on levemir and SSI. 5. A fib. Anticoagulated with pradaxa. Chronically on amiodarone and metoprolol. HR stable. 6. History of complete heart block s/p pacemaker 7. HTN. Chronically on norvasc, metoprolol 8. HLD. On statin 9. Leukopenia. Related to viral infection. Continue to follow 10. Chronic pain syndrome. Continue on home dose of buprenorphine.   [employer]

## 2019-08-18 NOTE — Progress Notes (Signed)
RN found patient naked, trying to get out of the chair. Pt stated that was going home. Pt. Is having diarrhea and is not aware of the episodes. Chair alarm on, call light at bedside, floor padding placed by bed. Will continuo to monitor.

## 2019-08-18 NOTE — ED Notes (Signed)
Lunch Tray Ordered@ 1025. 

## 2019-08-18 NOTE — H&P (Signed)
History and Physical    Daisy Pearson [id number] DOB: [birth year] DOA: 08/17/2019  PCP: Karleen Hampshire., MD  Patient coming from: Home.  Chief Complaint: Weakness and confusion.  History provided by patient's daughter.  HPI: Daisy Pearson is a 86 y.o. female with history of CAD status post CABG, atrial fibrillation, complete heart block status post pacemaker, chronic kidney disease stage III hyperlipidemia has been recently feeling increasingly weak with poor appetite and over the last 2 days been having increasing confusion.  Per patient's daughter patient's daughter was diagnosed with Covid infection 2 weeks ago.  Patient not have any chest pain or shortness of breath nausea vomiting or diarrhea.  ED Course: In the ER patient initially was confused but became more oriented after hydration presently oriented to her name.  Follows commands moves all extremities CT head unremarkable.  Covid test was positive.  Chest x-ray showed bilateral infiltrates.  Labs show acute renal failure with creatinine worsening from 1.2 presently is around 12.02.  EKG shows paced rhythm.  CBC largely unremarkable.  CRP was 1.5 high-sensitivity troponin XVI.  65.  Given the acute encephalopathy with chest x-ray showing bilateral infiltrates patient started on initially per antibiotics and switched to remdesivir and IV Decadron once Covid was positive.  Review of Systems: As per HPI, rest all negative.   Past Medical History:  Diagnosis Date  . Acute pancreatitis 02/14/2017   Archie Endo 02/14/2017  . Arthritis    "knees, skeletal joints" (02/19/2017)  . Chronic lower back pain   . CKD (chronic kidney disease), stage IV ([place])    hx/notes 02/14/2017  . Coronary artery disease   . Fibromyalgia   . Hyperlipidemia    hx/notes 02/14/2017  . Hypertension   . PAF (paroxysmal atrial fibrillation) ([place])    hx/notes 02/14/2017  . Presence of permanent cardiac pacemaker   . Sleep apnea    "wore mask til ~ 18  months ago; not a problem now" (02/19/2017)  . Stroke [facility]) 2010   "got TPA: completely resolved" (02/19/2017)  . Type II diabetes mellitus ([place])    hx/notes 02/14/2017    Past Surgical History:  Procedure Laterality Date  . 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 AORTIC VALVE REPLACEMENT (AVR)/CORONARY ARTERY BYPASS GRAFTING (CABG)    . BACK SURGERY    . BREAST SURGERY     "had some calcified tissue removed"  . CARDIAC CATHETERIZATION  2014  . CARDIAC VALVE REPLACEMENT    . CHOLECYSTECTOMY N/A 02/22/2017   Procedure: LAPAROSCOPIC CHOLECYSTECTOMY WITH INTRAOPERATIVE CHOLANGIOGRAM;  Surgeon: Ralene Ok, MD;  Location: [facility];  Service: General;  Laterality: N/A;  . CORONARY ARTERY BYPASS GRAFT  2014   CABG X2  . ERCP  02/19/2017  . ERCP N/A 02/19/2017   Procedure: ENDOSCOPIC RETROGRADE CHOLANGIOPANCREATOGRAPHY (ERCP);  Surgeon: Clarene Essex, MD;  Location: [facility];  Service: Endoscopy;  Laterality: N/A;  . FIXATION KYPHOPLASTY LUMBAR SPINE    . INSERT / REPLACE / REMOVE PACEMAKER  04/05/2013   [employer] Serial# [id number], [id number], SERIAL [id number]  . KNEE ARTHROSCOPY Right    "torn meniscus"  . PARTIAL THYMECTOMY  1964     reports that she has never smoked. She has never used smokeless tobacco. She reports that she does not drink alcohol or use drugs.  Allergies  Allergen Reactions  . Esomeprazole Sodium Swelling  . Penicillins Anaphylaxis and Rash    Has patient had a PCN reaction causing immediate rash, facial/tongue/throat swelling, SOB or  lightheadedness with hypotension: Yes Has patient had a PCN reaction causing severe rash involving mucus membranes or skin necrosis: No Has patient had a PCN reaction that required hospitalization: No Has patient had a PCN reaction occurring within the last 10 years: No If all of the above answers are "NO", then may proceed with Cephalosporin use.  . Clindamycin/Lincomycin Hives       . Hydrocodone  Other (See Comments)    Makes her crazy  . Nsaids Hives and Other (See Comments)    kidney failure  . Valsartan Swelling  . Hydrocodone-Acetaminophen Anxiety    Other reaction(s): Confusion (intolerance), Malaise (intolerance)  . Prilosec [Omeprazole] Swelling and Rash    Family History  Problem Relation Age of Onset  . Hypertension Father     Prior to Admission medications   Medication Sig Start Date End Date Taking? Authorizing Provider  acetaminophen (TYLENOL) 325 MG tablet Take 650 mg by mouth every 6 (six) hours as needed for headache (pain).    [provider]  alendronate (FOSAMAX) 70 MG tablet Take 70 mg by mouth every Saturday. Take with a full glass of water on an empty stomach.    [provider]  amiodarone (PACERONE) 200 MG tablet Take 200 mg by mouth daily.    [provider]  amLODipine (NORVASC) 5 MG tablet Take 1 tablet (5 mg total) by mouth daily. 02/24/17   Ina Homes, MD  atorvastatin (LIPITOR) 40 MG tablet Take 40 mg by mouth daily.    [provider]  Buprenorphine HCl (BELBUCA) 300 MCG FILM Place inside cheek. 03/20/19   [provider]  calcitRIOL (ROCALTROL) 0.5 MCG capsule Take 0.5 mcg by mouth daily.    [provider]  cetirizine (ZYRTEC) 10 MG tablet Take 10 mg by mouth daily as needed for allergies.    [provider]  Cholecalciferol (VITAMIN D) 2000 units tablet Take 2,000 Units by mouth daily.    [provider]  dabigatran (PRADAXA) 75 MG CAPS capsule Take by mouth. 10/12/18   [provider]  enalapril (VASOTEC) 10 MG tablet Take 1 tablet (10 mg total) by mouth daily. 02/24/17   Ina Homes, MD  enoxaparin (LOVENOX) 150 MG/ML injection Inject 0.93 mLs (140 mg total) into the skin daily. 02/24/17 02/24/18  Ina Homes, MD  EPINEPHrine (EPIPEN 2-PAK) 0.3 mg/0.3 mL IJ SOAJ injection Inject 0.3 mg into the muscle once as needed (severe allergic reaction).    [provider]  Insulin Detemir (LEVEMIR FLEXTOUCH) 100 UNIT/ML Pen Inject 18 Units into the skin 2 (two) times daily.    [provider]  Insulin Glulisine (APIDRA SOLOSTAR) 100 UNIT/ML Solostar Pen Inject 12 Units into the skin See admin instructions. Inject 12 units subcutaneously three times daily before meals - plus adjustment if CBG >200 - 1 unit for every 20    [provider]  metaxalone (SKELAXIN) 800 MG tablet Take 800 mg by mouth 3 (three) times daily as needed for muscle spasms.    [provider]  Methylfol-Methylcob-Acetylcyst (CEREFOLIN NAC) 6-2-600 MG TABS Take 1 tablet by mouth daily.    [provider]  metoprolol tartrate (LOPRESSOR) 25 MG tablet Take 25 mg by mouth 2 (two) times daily.    [provider]  mirtazapine (REMERON) 15 MG tablet Take 15 mg by mouth at bedtime.    [provider]  nitroGLYCERIN (NITROSTAT) 0.4 MG SL tablet Place 0.4 mg under the tongue every 5 (five) minutes as needed  for chest pain.    [provider]  Omega-3 Fatty Acids (OMEGA 3 PO) Take 1 capsule by mouth daily.    [provider]  oxyCODONE-acetaminophen (PERCOCET/ROXICET) 5-325 MG tablet Take 1 tablet by mouth 2 (two) times daily as needed for severe pain.    [provider]  polyethylene glycol (MIRALAX / GLYCOLAX) packet Take 17 g by mouth daily. 02/24/17   Ina Homes, MD  QUEtiapine (SEROQUEL) 25 MG tablet Take 25 mg by mouth at bedtime.    [provider]  senna-docusate (SENOKOT-S) 8.6-50 MG tablet Take 2 tablets by mouth 2 (two) times daily. 02/23/17   Ina Homes, MD  warfarin (JANTOVEN) 4 MG tablet Take 2-4 mg by mouth See admin instructions. Take 1/2 tablet (2 mg) by mouth on Saturday at bedtime, take 1 tablet (4 mg) on all other nights    [provider]    Physical Exam: Constitutional: Moderately built and nourished. Vitals:   08/17/19 2045 08/18/19 0028 08/18/19 0100 08/18/19  0200  BP: (!) 151/96 (!) 152/59 (!) 114/49   Pulse: 68 65 65 64  Resp: 20 20 20 16   Temp: 98.2 F (36.8 C)     TempSrc: Oral     SpO2: 97% 97% 98% 96%  Weight:      Height:       Eyes: Anicteric no pallor. ENMT: No discharge from the ears eyes nose or mouth. Neck: No mass or.  No neck rigidity. Respiratory: No rhonchi or crepitations. Cardiovascular: S1-S2 heard. Abdomen: Soft nontender bowel sounds present. Musculoskeletal: No edema.  No joint effusion. Skin: No rash. Neurologic: Alert awake oriented to name moves all extremities 5 x 5. Psychiatric: Oriented to name.   Labs on Admission: I have personally reviewed following labs and imaging studies  CBC: Recent Labs  Lab 08/17/19 1455 08/17/19 2120  WBC 3.6* 3.0*  NEUTROABS  --  2.3  HGB 12.0 12.0  HCT 38.4 37.1  MCV 97.2 95.4  PLT 154 0000000*   Basic Metabolic Panel: Recent Labs  Lab 08/17/19 1455  NA 136  K 4.5  CL 101  CO2 21*  GLUCOSE 271*  BUN 26*  CREATININE 2.02*  CALCIUM 9.5   GFR: Estimated Creatinine Clearance: 23.4 mL/min (A) (by C-G formula based on SCr of 2.02 mg/dL (H)). Liver Function Tests: Recent Labs  Lab 08/17/19 1455  AST 37  ALT 24  ALKPHOS 52  BILITOT 0.9  PROT 6.6  ALBUMIN 3.4*   No results for input(s): LIPASE, AMYLASE in the last 168 hours. Recent Labs  Lab 08/17/19 2120  AMMONIA 9   Coagulation Profile: Recent Labs  Lab 08/17/19 2120  INR 1.0   Cardiac Enzymes: No results for input(s): CKTOTAL, CKMB, CKMBINDEX, TROPONINI in the last 168 hours. BNP (last 3 results) No results for input(s): PROBNP in the last 8760 hours. HbA1C: No results for input(s): HGBA1C in the last 72 hours. CBG: Recent Labs  Lab 08/17/19 1932 08/18/19 0038  GLUCAP 238* 215*   Lipid Profile: No results for input(s): CHOL, HDL, LDLCALC, TRIG, CHOLHDL, LDLDIRECT in the last 72 hours. Thyroid Function Tests: Recent Labs    08/17/19 2120  TSH 2.665   Anemia Panel: No results for  input(s): VITAMINB12, FOLATE, FERRITIN, TIBC, IRON, RETICCTPCT in the last 72 hours. Urine analysis:    Component Value Date/Time   COLORURINE YELLOW 08/17/2019 2035   APPEARANCEUR HAZY (A) 08/17/2019 2035   LABSPEC 1.016 08/17/2019 2035   PHURINE 5.0 08/17/2019 2035  GLUCOSEU 50 (A) 08/17/2019 2035   HGBUR NEGATIVE 08/17/2019 2035   BILIRUBINUR NEGATIVE 08/17/2019 2035   KETONESUR NEGATIVE 08/17/2019 2035   PROTEINUR NEGATIVE 08/17/2019 2035   NITRITE NEGATIVE 08/17/2019 2035   LEUKOCYTESUR TRACE (A) 08/17/2019 2035   Sepsis Labs: @LABRCNTIP (procalcitonin:4,lacticidven:4) ) Recent Results (from the past 240 hour(s))  SARS CORONAVIRUS 2 (TAT 6-24 HRS) Nasopharyngeal Nasopharyngeal Swab     Status: Abnormal   Collection Time: 08/17/19  8:35 PM   Specimen: Nasopharyngeal Swab  Result Value Ref Range Status   SARS Coronavirus 2 POSITIVE (A) NEGATIVE Final    Comment: RESULT CALLED TO, READ BACK BY AND VERIFIED WITH: Jacklyn Shell, RN AT [id number] ON 08/18/2019 BY SAINVILUS S (NOTE) SARS-CoV-2 target nucleic acids are DETECTED. The SARS-CoV-2 RNA is generally detectable in upper and lower respiratory specimens during the acute phase of infection. Positive results are indicative of the presence of SARS-CoV-2 RNA. Clinical correlation with patient history and other diagnostic information is  necessary to determine patient infection status. Positive results do not rule out bacterial infection or co-infection with other viruses.  The expected result is Negative. Fact Sheet for Patients: SugarRoll.be Fact Sheet for Healthcare Providers: https://www.woods-mathews.com/ This test is not yet approved or cleared by the [place] FDA and  has been authorized for detection and/or diagnosis of SARS-CoV-2 by FDA under an Emergency Use Authorization (EUA). This EUA will remain  in effect (meaning this test can b e used) for the duration of the COVID-19  declaration under Section 564(b)(1) of the Act, 21 U.S.C. section 360bbb-3(b)(1), unless the authorization is terminated or revoked sooner. Performed at [facility], [place] [address] [place], [place] [address]   Respiratory Panel by RT PCR (Flu A&B, Covid) - Nasopharyngeal Swab     Status: Abnormal   Collection Time: 08/17/19 11:42 PM   Specimen: Nasopharyngeal Swab  Result Value Ref Range Status   SARS Coronavirus 2 by RT PCR POSITIVE (A) NEGATIVE Final    Comment: RESULT CALLED TO, READ BACK BY AND VERIFIED WITH: WOODY TN 08/18/19 0106 JDW (NOTE) SARS-CoV-2 target nucleic acids are DETECTED. SARS-CoV-2 RNA is generally detectable in upper respiratory specimens  during the acute phase of infection. Positive results are indicative of the presence of the identified virus, but do not rule out bacterial infection or co-infection with other pathogens not detected by the test. Clinical correlation with patient history and other diagnostic information is necessary to determine patient infection status. The expected result is Negative. Fact Sheet for Patients:  PinkCheek.be Fact Sheet for Healthcare Providers: GravelBags.it This test is not yet approved or cleared by the [place] FDA and  has been authorized for detection and/or diagnosis of SARS-CoV-2 by FDA under an Emergency Use Authorization (EUA).  This EUA will remain in effect (meaning this test can be used) for the dur ation of  the COVID-19 declaration under Section 564(b)(1) of the Act, 21 U.S.C. section 360bbb-3(b)(1), unless the authorization is terminated or revoked sooner.    Influenza A by PCR NEGATIVE NEGATIVE Final   Influenza B by PCR NEGATIVE NEGATIVE Final    Comment: (NOTE) The Xpert Xpress SARS-CoV-2/FLU/RSV assay is intended as an aid in  the diagnosis of influenza from Nasopharyngeal swab specimens and  should not be used as a sole basis for  treatment. Nasal washings and  aspirates are unacceptable for Xpert Xpress SARS-CoV-2/FLU/RSV  testing. Fact Sheet for Patients: PinkCheek.be Fact Sheet for Healthcare Providers: GravelBags.it This test is not yet approved or  cleared by the [place] and  has been authorized for detection and/or diagnosis of SARS-CoV-2 by  FDA under an Emergency Use Authorization (EUA). This EUA will remain  in effect (meaning this test can be used) for the duration of the  Covid-19 declaration under Section 564(b)(1) of the Act, 21  U.S.C. section 360bbb-3(b)(1), unless the authorization is  terminated or revoked. Performed at [facility], [place] [address], [place], [place] [address]      Radiological Exams on Admission: DG Chest 2 View  Result Date: 08/17/2019 CLINICAL DATA:  Confusion and lethargy.  Altered mental status. EXAM: CHEST - 2 VIEW COMPARISON:  02/14/2017 FINDINGS: Low lung volumes. Focal airspace disease in the right mid lung is compatible with pneumonia. There is a small nodular opacity in the left mid lung superimposed on the anterior second rib. Interstitial markings are diffusely coarsened with chronic features. The cardio pericardial silhouette is enlarged. Right permanent pacemaker noted. The visualized bony structures of the thorax are intact. Status post vertebral augmentation in the region of the thoracolumbar junction. IMPRESSION: Focal airspace disease right mid lung with focal nodular opacity in the left mid lung. Features likely reflect multifocal pneumonia, but close follow-up recommended to ensure resolution. Electronically Signed   By: Misty Stanley M.D.   On: 08/17/2019 15:41   CT Head Wo Contrast  Result Date: 08/17/2019 CLINICAL DATA:  86 year old female with concern for intracranial hemorrhage. EXAM: CT HEAD WITHOUT CONTRAST TECHNIQUE: Contiguous axial images were obtained from the base of the skull  through the vertex without intravenous contrast. COMPARISON:  None. FINDINGS: Brain: Moderate age-related atrophy and chronic microvascular ischemic changes. There is no acute intracranial hemorrhage. No mass effect or midline shift. No extra-axial fluid collection. Vascular: No hyperdense vessel or unexpected calcification. Skull: Normal. Negative for fracture or focal lesion. Sinuses/Orbits: No acute finding. Other: None IMPRESSION: 1. No acute intracranial hemorrhage. 2. Age-related atrophy and chronic microvascular ischemic changes. Electronically Signed   By: Anner Crete M.D.   On: 08/17/2019 22:28    EKG: Independently reviewed.  Paced rhythm.  Assessment/Plan Principal Problem:   Acute respiratory failure due to COVID-19 [facility]) Active Problems:   Type 2 diabetes mellitus (HCC)   Essential hypertension   A-fib (HCC)   Acute encephalopathy   ARF (acute renal failure) ([place])    1. Acute respiratory failure hypoxia secondary to Covid infection for which I have started patient on IV remdesivir and Decadron.  Closely monitor respiratory status and inflammatory markers. 2. Acute encephalopathy could be related to Covid infection.  CT head unremarkable patient appears nonfocal.  Closely monitor. 3. Acute on chronic kidney disease stage III likely from worsening oral intake from Covid infection.  Received fluid bolus in the ER.  Will hold off patient's ACE inhibitor for now.  Follow metabolic panel closely. 4. Diabetes mellitus type 2 on Levemir.  Full [place] CBGs since patient is on Decadron. 5. A. fib with history of pacemaker for complete heart block on Pradaxa metoprolol and amiodarone. 6. History of CAD status post CABG denies any chest pain.  On Pradaxa statins and beta-blockers. 7. History of chronic pain on buprenorphine.  And Skelaxin as needed. 8. History of depression on Seroquel which was recently started.  Given the acute respiratory failure with Covid infection and worsening  renal function will need close monitoring for any further deterioration and will need inpatient status.   DVT prophylaxis: Pradaxa. Code Status: Full code as confirmed with patient's daughter. Family Communication:  Patient's daughter. Disposition Plan: To be determined. Consults called: None. Admission status: Inpatient.   Rise Patience MD [facility] Pager [phone].  If 7PM-7AM, please contact night-coverage [email or web address] Password [email or web address]  08/18/2019, 3:10 AM

## 2019-08-18 NOTE — ED Notes (Signed)
[place] RN and updated.

## 2019-08-18 NOTE — Progress Notes (Signed)
Due to patients AMS, Bupreorphine held per doctor.  Hand delivered medication to pharmacy.

## 2019-08-18 NOTE — Progress Notes (Signed)
PROGRESS NOTE  Brief Narrative: Daisy Pearson is an 86 y.o. female with a history of CAD s/p CABG x2 and AVR 2014, AFib, CHB s/p PPM on pradaxa, CVA, IDT2DM, stage IV CKD, among others who [facility] with altered mental status, decreased per oral intake for 4 days. Patient admitted earlier this morning for covid-19 pneumonia, dehydration, and seen later by additional hospitalist, transfer to [facility] initiated.  Subjective: Does not know what day/week/month/year it is, cannot volunteer where she is but confirms she knew it was the hospital and once I tell her she has covid, she recalls being told that. She has no current complaints, is drinking water freely without N/V/D, no chest or other pain. No dyspnea, on room air at rest.   Objective: BP (!) 145/110   Pulse 61   Temp 97.6 F (36.4 C) (Oral)   Resp (!) 25   Ht 5\' 3"  (1.6 m)   Wt 96.6 kg   SpO2 97%   BMI 37.73 kg/m   Gen: Elderly, nontoxic female Pulm: Some R > L basilar crackles and nonlabored on room air  CV: RRR, no murmur, no JVD, no edema GI: Soft, NT, ND, +BS  Neuro: Alert and oriented. No focal deficits. Skin: No rashes, lesions or ulcers on visualized skin  Assessment & Plan: Principal Problem:   Acute respiratory failure due to COVID-19 [facility]) Active Problems:   Type 2 diabetes mellitus (HCC)   Essential hypertension   A-fib (HCC)   Acute encephalopathy   ARF (acute renal failure) (HCC)  Acute hypoxemic respiratory failure due to covid-19 pneumonia: SARS-CoV-2 PCR positive 1/21. - Continue remdesivir 1/22 - 1/26. Monitor LFTs - Continue steroids due to hypoxemia.  - CRP minimally elevated, will be trended daily. PCT reassuringly negative, agree with stopping abx.  - Bilateral opacities on CXR w/recommendation to repeat radiographic surveillance after resolution.  - Vitamin C, zinc - Encourage OOB, IS, FV, and awake proning if able - Tylenol and antitussives prn - Continue airborne, contact  precautions while admitted. Isolation period would be recommended for 21 days from positive testing. - Maintain euvolemia, currently appears dehydrated.  - Avoid NSAIDs   IDT2DM: Well-controlled chronically with HbA1c 6.4% 08/09/2019 but now steroid-induced hyperglycemia  - Continue levemir (18u BID home dose) + SSI (20u TIDWC home dose) and adjust as indicated.   Acute metabolic encephalopathy: CT head shows chronic atrophy, microvascular disease without acute abnormality. Likely due to covid-19 and dehydration/renal failure. Ammonia wnl.  - Delirium precautions - Minimize sedating medications, consider holding seroquel (recently started)  Mild AKI on stage IV CKD: SCr 2 on admission from recent baseline of 1.8-1.9 as outpatient since at least Oct 2020. - Avoid nephrotoxins, hypotension, hold ACEi, monitor Cr in AM and UOP throughout the day.   Chronic atrial fibrillation s/p ablation and PPM, CAD s/p CABG: No chest pain, HS troponin wnl: - Continue metoprolol, amiodarone, pradaxa  HTN:  - Norvasc, metoprolol  HLD: Latest LDL was 68, HDL 64. - Statin  History of CVA:  - Continue anticoagulation, statin, risk factor control.  OSA: On BiPAP.   Chronic pain syndrome: At [facility] joint/lower back - Consider holding buprenorphine (pain management prescribed buprenorphine HCL (BELBUCA) 450 mcg film inside cheek q12h, up from 356mcg dose 12/29) if sedation noted/encephalopathy continues, denies current pain.  - Continue metaxalone (long term medication)   Daisy Pour, MD Pager on amion 08/18/2019, 10:43 AM

## 2019-08-18 NOTE — Progress Notes (Signed)
RN spoke with daughter Danella Maiers when pt. Arrived. Facetime with pt. State pt. Got the first dose of covid vaccination 2 weeks ago. Will need a 2nd dose. Please call daughter at [phone]

## 2019-08-19 DIAGNOSIS — E119 Type 2 diabetes mellitus without complications: Secondary | ICD-10-CM

## 2019-08-19 DIAGNOSIS — I10 Essential (primary) hypertension: Secondary | ICD-10-CM

## 2019-08-19 DIAGNOSIS — I4891 Unspecified atrial fibrillation: Secondary | ICD-10-CM

## 2019-08-19 LAB — COMPREHENSIVE METABOLIC PANEL
ALT: 23 U/L (ref 0–44)
AST: 28 U/L (ref 15–41)
Albumin: 3.1 g/dL — ABNORMAL LOW (ref 3.5–5.0)
Alkaline Phosphatase: 45 U/L (ref 38–126)
Anion gap: 8 (ref 5–15)
BUN: 35 mg/dL — ABNORMAL HIGH (ref 8–23)
CO2: 23 mmol/L (ref 22–32)
Calcium: 9 mg/dL (ref 8.9–10.3)
Chloride: 106 mmol/L (ref 98–111)
Creatinine, Ser: 1.52 mg/dL — ABNORMAL HIGH (ref 0.44–1.00)
GFR calc Af Amer: 36 mL/min — ABNORMAL LOW (ref >60)
GFR calc non Af Amer: 31 mL/min — ABNORMAL LOW (ref >60)
Glucose, Bld: 234 mg/dL — ABNORMAL HIGH (ref 70–99)
Potassium: 4.6 mmol/L (ref 3.5–5.1)
Sodium: 137 mmol/L (ref 135–145)
Total Bilirubin: 0.8 mg/dL (ref 0.3–1.2)
Total Protein: 6.3 g/dL — ABNORMAL LOW (ref 6.5–8.1)

## 2019-08-19 LAB — CBC WITH DIFFERENTIAL/PLATELET
Abs Immature Granulocytes: 0.01 10*3/uL (ref 0.00–0.07)
Basophils Absolute: 0 10*3/uL (ref 0.0–0.1)
Basophils Relative: 0 %
Eosinophils Absolute: 0 10*3/uL (ref 0.0–0.5)
Eosinophils Relative: 0 %
HCT: 32.7 % — ABNORMAL LOW (ref 36.0–46.0)
Hemoglobin: 10.7 g/dL — ABNORMAL LOW (ref 12.0–15.0)
Immature Granulocytes: 1 %
Lymphocytes Relative: 21 %
Lymphs Abs: 0.4 10*3/uL — ABNORMAL LOW (ref 0.7–4.0)
MCH: 30.3 pg (ref 26.0–34.0)
MCHC: 32.7 g/dL (ref 30.0–36.0)
MCV: 92.6 fL (ref 80.0–100.0)
Monocytes Absolute: 0.2 10*3/uL (ref 0.1–1.0)
Monocytes Relative: 12 %
Neutro Abs: 1.1 10*3/uL — ABNORMAL LOW (ref 1.7–7.7)
Neutrophils Relative %: 66 %
Platelets: 136 10*3/uL — ABNORMAL LOW (ref 150–400)
RBC: 3.53 MIL/uL — ABNORMAL LOW (ref 3.87–5.11)
RDW: 14 % (ref 11.5–15.5)
WBC: 1.7 10*3/uL — ABNORMAL LOW (ref 4.0–10.5)
nRBC: 0 % (ref 0.0–0.2)

## 2019-08-19 LAB — GLUCOSE, CAPILLARY
Glucose-Capillary: 262 mg/dL — ABNORMAL HIGH (ref 70–99)
Glucose-Capillary: 185 mg/dL — ABNORMAL HIGH (ref 70–99)
Glucose-Capillary: 321 mg/dL — ABNORMAL HIGH (ref 70–99)
Glucose-Capillary: 313 mg/dL — ABNORMAL HIGH (ref 70–99)
Glucose-Capillary: 347 mg/dL — ABNORMAL HIGH (ref 70–99)

## 2019-08-19 LAB — C-REACTIVE PROTEIN: CRP: 1.4 mg/dL — ABNORMAL HIGH (ref <1.0)

## 2019-08-19 LAB — D-DIMER, QUANTITATIVE: D-Dimer, Quant: 0.38 ug/mL-FEU (ref 0.00–0.50)

## 2019-08-19 MED ORDER — FOSFOMYCIN TROMETHAMINE 3 G PO PACK
3.0000 g | PACK | Freq: Once | ORAL | Status: AC
  Administered 2019-08-19: 3 g via ORAL
  Filled 2019-08-19: qty 3

## 2019-08-19 MED ORDER — INSULIN ASPART 100 UNIT/ML ~~LOC~~ SOLN
10.0000 [IU] | Freq: Three times a day (TID) | SUBCUTANEOUS | Status: DC
  Administered 2019-08-19 – 2019-08-20 (×3): 10 [IU] via SUBCUTANEOUS

## 2019-08-19 NOTE — Progress Notes (Signed)
PROGRESS NOTE  Daisy Pearson  [id number] DOB: 12-26-1933 DOA: 08/17/2019 PCP: Karleen Hampshire., MD   Brief Narrative: Daisy Pearson is an 86 y.o. female with a history of CAD s/p CABG x2 and AVR 2014, AFib, CHB s/p PPM on pradaxa, CVA, IDT2DM, stage III-IV CKD, among others who [facility] with altered mental statu and decreased per oral intake for 4 days. She was hypoxemic with opacities bilaterally on CXR. Levaquin was given, and after SARS-CoV-2 testing returned positive remdesivir and steroids were started and the patient was admitted to [facility] 1/22. Hypoxemia has resolved. Hospitalization complicated by delirium and steroid-induced hyperglycemia.  Assessment & Plan: Principal Problem:   Acute respiratory failure due to COVID-19 [facility]) Active Problems:   Type 2 diabetes mellitus ([place])   Essential hypertension   A-fib (HCC)   Acute encephalopathy   ARF (acute renal failure) (HCC)  Acute hypoxemic respiratory failure due to covid-19 pneumonia: SARS-CoV-2 PCR positive 1/21. - Continue remdesivir 1/22 - 1/26. Monitor LFTs, remain wnl. - Continue steroids due to hypoxemia. With delirium, hyperglycemia, UTI, and rapid resolution of hypoxemia, we will stop steroids - CRP minimally elevated and improved. PCT reassuringly negative. - Bilateral opacities on CXR w/recommendation to repeat radiographic surveillance after resolution.  - Vitamin C, zinc - Encourage OOB, IS, FV, and awake proning if able - Tylenol and antitussives prn - Continue airborne, contact precautions while admitted. Isolation period would be recommended for 21 days from positive testing. - Maintain euvolemia, currently appears dehydrated.  - Avoid NSAIDs   IDT2DM: Well-controlled chronically with HbA1c 6.4% 08/09/2019 but now steroid-induced hyperglycemia  - Continue levemir (18u BID home dose) + SSI (20u TIDWC home dose), add 10u mealtime insulin for elevated postprandials, no hypoglycemia  noted.  Acute metabolic encephalopathy: CT head shows chronic atrophy, microvascular disease without acute abnormality. Likely due to covid-19 and dehydration/renal failure. Ammonia wnl.  - Delirium precautions - Minimize sedating medications, consider holding seroquel (recently started)  Mild AKI on stage IIIb CKD: SCr 2 on admission from baseline of 1.5 to which creatinine has returned. Note stage IV CKD in previous notes for putative recent baseline of 1.8-1.9 as outpatient since at least Oct 2020, though this may overestimate renal insufficiency. - Avoid nephrotoxins, hypotension, hold ACEi, monitor Cr regularly  Chronic atrial fibrillation s/p ablation and PPM, CAD s/p CABG: No chest pain, HS troponin wnl: - Continue metoprolol, amiodarone, pradaxa  HTN:  - Norvasc, metoprolol  HLD: Latest LDL was 68, HDL 64. - Statin  History of CVA:  - Continue anticoagulation, statin, risk factor control.  OSA: On BiPAP.   Chronic pain syndrome: At [facility] joint/lower back - Consider holding buprenorphine (pain management prescribed buprenorphine HCL (BELBUCA) 450 mcg film inside cheek q12h, up from 393mcg dose 12/29) if sedation noted/encephalopathy continues, denies current pain.  - Continue metaxalone (long term medication)  UTI: Patient has occasional UTIs in the past with most symptoms related to confusion. Culture growing GNRs. Daughter reports efficacy of bactrim in the past, though with her elevated creatinine, this is less desirable, which also goes for FQs. Patient has a true PCN allergy, so we will avoid PCN/cephelosporins.  - Give fosfomycin x1 (no evidence of sepsis), monitor urine culture data  DVT prophylaxis: Pradaxa Code Status: Full Family Communication: Daughter by phone this afternoon Disposition Plan: Return home w/HH once stable. Hoping to minimize duration of hospitalization, possible DC 1/24.   Consultants:   None  Procedures:    None  Antimicrobials:  Levaquin 1/22  Fosfomycin 1/23  Remdesivir 1/22 - 1/26   Subjective: More alert today, more aware of surroundings, though did have agitated delirium last night. This is a common occurrence per her daughter when she is sick, hospitalized, and has a UTI. No dyspnea. Walked near her baseline with PT today without hypoxemia on room air, denies dyspnea.  Objective: Vitals:   08/18/19 1115 08/18/19 1600 08/18/19 2005 08/19/19 0400  BP: (!) 153/67 132/82 (!) 145/53 (!) 143/66  Pulse: 73 60    Resp: 16 20    Temp: 98.6 F (37 C) (!) 97.1 F (36.2 C) 98.6 F (37 C) (!) 96.8 F (36 C)  TempSrc: Oral Axillary Oral Axillary  SpO2: 96% 96%    Weight:      Height:        Intake/Output Summary (Last 24 hours) at 08/19/2019 1455 Last data filed at 08/19/2019 0957 Gross per 24 hour  Intake 1080 ml  Output 750 ml  Net 330 ml   Filed Weights   08/17/19 2031  Weight: 96.6 kg    Gen: Calm elderly female in no distress Pulm: Non-labored breathing room air. Clear to auscultation bilaterally.  CV: Regular rate and rhythm. No murmur, rub, or gallop. No JVD, no pedal edema. GI: Abdomen soft, non-tender, non-distended, with normoactive bowel sounds. No organomegaly or masses felt. Ext: Warm, no deformities Skin: No rashes, lesions or ulcers Neuro: Alert and oriented to person, "hospital," and "pneumonia," not time. No focal neurological deficits. Psych: Judgement and insight appear impaired. Mood & affect appropriate.   Data Reviewed: I have personally reviewed following labs and imaging studies  CBC: Recent Labs  Lab 08/17/19 1455 08/17/19 2120 08/19/19 0247  WBC 3.6* 3.0* 1.7*  NEUTROABS  --  2.3 1.1*  HGB 12.0 12.0 10.7*  HCT 38.4 37.1 32.7*  MCV 97.2 95.4 92.6  PLT 154 138* [id number]*   Basic Metabolic Panel: Recent Labs  Lab 08/17/19 1455 08/19/19 0247  NA 136 137  K 4.5 4.6  CL 101 106  CO2 21* 23  GLUCOSE 271* 234*  BUN 26* 35*  CREATININE  2.02* 1.52*  CALCIUM 9.5 9.0   GFR: Estimated Creatinine Clearance: 31 mL/min (A) (by C-G formula based on SCr of 1.52 mg/dL (H)). Liver Function Tests: Recent Labs  Lab 08/17/19 1455 08/19/19 0247  AST 37 28  ALT 24 23  ALKPHOS 52 45  BILITOT 0.9 0.8  PROT 6.6 6.3*  ALBUMIN 3.4* 3.1*   No results for input(s): LIPASE, AMYLASE in the last 168 hours. Recent Labs  Lab 08/17/19 2120  AMMONIA 9   Coagulation Profile: Recent Labs  Lab 08/17/19 2120  INR 1.0   Cardiac Enzymes: No results for input(s): CKTOTAL, CKMB, CKMBINDEX, TROPONINI in the last 168 hours. BNP (last 3 results) No results for input(s): PROBNP in the last 8760 hours. HbA1C: No results for input(s): HGBA1C in the last 72 hours. CBG: Recent Labs  Lab 08/18/19 1138 08/18/19 1828 08/18/19 2135 08/19/19 0743 08/19/19 1206  GLUCAP 294* 351* 262* 185* 321*   Lipid Profile: No results for input(s): CHOL, HDL, LDLCALC, TRIG, CHOLHDL, LDLDIRECT in the last 72 hours. Thyroid Function Tests: Recent Labs    08/17/19 2120  TSH 2.665   Anemia Panel: Recent Labs    08/18/19 0312  FERRITIN 65   Urine analysis:    Component Value Date/Time   COLORURINE YELLOW 08/17/2019 2035   APPEARANCEUR HAZY (A) 08/17/2019 2035   LABSPEC 1.016 08/17/2019 2035  PHURINE 5.0 08/17/2019 2035   GLUCOSEU 50 (A) 08/17/2019 2035   HGBUR NEGATIVE 08/17/2019 2035   BILIRUBINUR NEGATIVE 08/17/2019 2035   KETONESUR NEGATIVE 08/17/2019 2035   PROTEINUR NEGATIVE 08/17/2019 2035   NITRITE NEGATIVE 08/17/2019 2035   LEUKOCYTESUR TRACE (A) 08/17/2019 2035   Recent Results (from the past 240 hour(s))  SARS CORONAVIRUS 2 (TAT 6-24 HRS) Nasopharyngeal Nasopharyngeal Swab     Status: Abnormal   Collection Time: 08/17/19  8:35 PM   Specimen: Nasopharyngeal Swab  Result Value Ref Range Status   SARS Coronavirus 2 POSITIVE (A) NEGATIVE Final    Comment: RESULT CALLED TO, READ BACK BY AND VERIFIED WITH: Jacklyn Shell, RN AT [id number] ON  08/18/2019 BY SAINVILUS S (NOTE) SARS-CoV-2 target nucleic acids are DETECTED. The SARS-CoV-2 RNA is generally detectable in upper and lower respiratory specimens during the acute phase of infection. Positive results are indicative of the presence of SARS-CoV-2 RNA. Clinical correlation with patient history and other diagnostic information is  necessary to determine patient infection status. Positive results do not rule out bacterial infection or co-infection with other viruses.  The expected result is Negative. Fact Sheet for Patients: SugarRoll.be Fact Sheet for Healthcare Providers: https://www.woods-mathews.com/ This test is not yet approved or cleared by the [place] FDA and  has been authorized for detection and/or diagnosis of SARS-CoV-2 by FDA under an Emergency Use Authorization (EUA). This EUA will remain  in effect (meaning this test can b e used) for the duration of the COVID-19 declaration under Section 564(b)(1) of the Act, 21 U.S.C. section 360bbb-3(b)(1), unless the authorization is terminated or revoked sooner. Performed at [facility], [place] [address] [place], [place] [address]   Blood Culture (routine x 2)     Status: None (Preliminary result)   Collection Time: 08/17/19  9:21 PM   Specimen: BLOOD  Result Value Ref Range Status   Specimen Description BLOOD LEFT ANTECUBITAL  Final   Special Requests   Final    BOTTLES DRAWN AEROBIC AND ANAEROBIC Blood Culture adequate volume   Culture   Final    NO GROWTH 2 DAYS Performed at [facility], [place] [address] [place], [place] [address]    Report Status PENDING  Incomplete  Blood Culture (routine x 2)     Status: None (Preliminary result)   Collection Time: 08/17/19  9:37 PM   Specimen: BLOOD RIGHT HAND  Result Value Ref Range Status   Specimen Description BLOOD RIGHT HAND  Final   Special Requests   Final    BOTTLES DRAWN AEROBIC AND ANAEROBIC Blood  Culture adequate volume   Culture   Final    NO GROWTH 2 DAYS Performed at [facility], [place] [address], [place], [place] [address]    Report Status PENDING  Incomplete  Urine culture     Status: Abnormal (Preliminary result)   Collection Time: 08/17/19  9:37 PM   Specimen: Urine, Catheterized  Result Value Ref Range Status   Specimen Description URINE, CATHETERIZED  Final   Special Requests NONE  Final   Culture (A)  Final    >=100,000 COLONIES/mL GRAM NEGATIVE RODS IDENTIFICATION AND SUSCEPTIBILITIES TO FOLLOW Performed at [facility], [place] [address], [place], [place] [address]    Report Status PENDING  Incomplete  Respiratory Panel by RT PCR (Flu A&B, Covid) - Nasopharyngeal Swab     Status: Abnormal   Collection Time: 08/17/19 11:42 PM   Specimen: Nasopharyngeal Swab  Result  Value Ref Range Status   SARS Coronavirus 2 by RT PCR POSITIVE (A) NEGATIVE Final    Comment: RESULT CALLED TO, READ BACK BY AND VERIFIED WITH: WOODY TN 08/18/19 0106 JDW (NOTE) SARS-CoV-2 target nucleic acids are DETECTED. SARS-CoV-2 RNA is generally detectable in upper respiratory specimens  during the acute phase of infection. Positive results are indicative of the presence of the identified virus, but do not rule out bacterial infection or co-infection with other pathogens not detected by the test. Clinical correlation with patient history and other diagnostic information is necessary to determine patient infection status. The expected result is Negative. Fact Sheet for Patients:  PinkCheek.be Fact Sheet for Healthcare Providers: GravelBags.it This test is not yet approved or cleared by the [place] FDA and  has been authorized for detection and/or diagnosis of SARS-CoV-2 by FDA under an Emergency Use Authorization (EUA).  This EUA will remain in effect (meaning this test can be used) for the dur ation of  the COVID-19  declaration under Section 564(b)(1) of the Act, 21 U.S.C. section 360bbb-3(b)(1), unless the authorization is terminated or revoked sooner.    Influenza A by PCR NEGATIVE NEGATIVE Final   Influenza B by PCR NEGATIVE NEGATIVE Final    Comment: (NOTE) The Xpert Xpress SARS-CoV-2/FLU/RSV assay is intended as an aid in  the diagnosis of influenza from Nasopharyngeal swab specimens and  should not be used as a sole basis for treatment. Nasal washings and  aspirates are unacceptable for Xpert Xpress SARS-CoV-2/FLU/RSV  testing. Fact Sheet for Patients: PinkCheek.be Fact Sheet for Healthcare Providers: GravelBags.it This test is not yet approved or cleared by the [place] FDA and  has been authorized for detection and/or diagnosis of SARS-CoV-2 by  FDA under an Emergency Use Authorization (EUA). This EUA will remain  in effect (meaning this test can be used) for the duration of the  Covid-19 declaration under Section 564(b)(1) of the Act, 21  U.S.C. section 360bbb-3(b)(1), unless the authorization is  terminated or revoked. Performed at [facility], [place] [address] [place], [place] [address]       Radiology Studies: DG Chest 2 View  Result Date: 08/17/2019 CLINICAL DATA:  Confusion and lethargy.  Altered mental status. EXAM: CHEST - 2 VIEW COMPARISON:  02/14/2017 FINDINGS: Low lung volumes. Focal airspace disease in the right mid lung is compatible with pneumonia. There is a small nodular opacity in the left mid lung superimposed on the anterior second rib. Interstitial markings are diffusely coarsened with chronic features. The cardio pericardial silhouette is enlarged. Right permanent pacemaker noted. The visualized bony structures of the thorax are intact. Status post vertebral augmentation in the region of the thoracolumbar junction. IMPRESSION: Focal airspace disease right mid lung with focal nodular opacity in the left  mid lung. Features likely reflect multifocal pneumonia, but close follow-up recommended to ensure resolution. Electronically Signed   By: Misty Stanley M.D.   On: 08/17/2019 15:41   CT Head Wo Contrast  Result Date: 08/17/2019 CLINICAL DATA:  86 year old female with concern for intracranial hemorrhage. EXAM: CT HEAD WITHOUT CONTRAST TECHNIQUE: Contiguous axial images were obtained from the base of the skull through the vertex without intravenous contrast. COMPARISON:  None. FINDINGS: Brain: Moderate age-related atrophy and chronic microvascular ischemic changes. There is no acute intracranial hemorrhage. No mass effect or midline shift. No extra-axial fluid collection. Vascular: No hyperdense vessel or unexpected calcification. Skull: Normal. Negative for fracture or focal lesion. Sinuses/Orbits: No acute finding. Other: None IMPRESSION: 1.  No acute intracranial hemorrhage. 2. Age-related atrophy and chronic microvascular ischemic changes. Electronically Signed   By: Anner Crete M.D.   On: 08/17/2019 22:28    Scheduled Meds:  amiodarone  200 mg Oral Daily   amLODipine  5 mg Oral Daily   atorvastatin  40 mg Oral Daily   Buprenorphine HCl  450 mcg Buccal BID   cholecalciferol  2,000 Units Oral Daily   dabigatran  75 mg Oral Q12H   dexamethasone (DECADRON) injection  6 mg Intravenous Q24H   insulin aspart  0-9 Units Subcutaneous TID WC   insulin detemir  18 Units Subcutaneous BID   metoprolol tartrate  25 mg Oral BID   mirtazapine  15 mg Oral QHS   QUEtiapine  25 mg Oral QHS   Continuous Infusions:  remdesivir 100 mg in NS 100 mL 100 mg (08/19/19 0957)     LOS: 2 days   Time spent: 35 minutes.  [name], MD [facility] [email or web address] 08/19/2019, 2:55 PM

## 2019-08-19 NOTE — Evaluation (Signed)
Physical Therapy Evaluation Patient Details Name: Daisy Pearson MRN: [id number] DOB: Jul 10, 1934 Today's Date: 08/19/2019   History of Present Illness  86 y.o. female with a history of CAD s/p CABG x2 and AVR 2014, AFib, CHB s/p PPM on pradaxa, CVA, IDT2DM, stage IV CKD, among others who [facility] with altered mental status, decreased per oral intake for 4 days. Patient admitted earlier this morning for covid-19 pneumonia, dehydration, and seen later by additional hospitalist, transfer to [facility] initiated.  Clinical Impression   Pt admitted with above diagnosis. Was living in basement apartment of daughters home, was moderately independent with ADLs, when unable to perform tasks daughter would help out, has 6 steps to basement and is usually superviseed with these. Daughter reports pt gets altered mental status when hospitalized but usually recovers quickly once in home environment. Pt currently with functional limitations due to the deficits listed below (see PT Problem List). This am pt did very well with mobility was able to stand from recliner with min guard assist and RW, ambulated in room approx 61ft (daughter states she usually does not ambulate great distances), pt was on room air and maintained sats in high 90s throughout session. Pt will benefit from skilled PT to increase their independence and safety with mobility to allow discharge to the venue listed below.       Follow Up Recommendations Home health PT    Equipment Recommendations  Rolling walker with 5" wheels    Recommendations for Other Services OT consult     Precautions / Restrictions Precautions Precautions: Fall Restrictions Weight Bearing Restrictions: No      Mobility  Bed Mobility               General bed mobility comments: Pt received sitting in recliner  Transfers Overall transfer level: Needs assistance Equipment used: Rolling walker (2 wheeled) Transfers: Sit to/from  Stand;Stand Pivot Transfers Sit to Stand: Min guard Stand pivot transfers: Min guard          Ambulation/Gait Ambulation/Gait assistance: Min guard;Min assist Gait Distance (Feet): 52 Feet Assistive device: Rolling walker (2 wheeled) Gait Pattern/deviations: Step-through pattern     General Gait Details: ambulated approx 75ft with RW and min/min guard assist, on room air and remained in 90s throughout , 1 LOB noted w/ ambulation  Stairs            Wheelchair Mobility    Modified Rankin (Stroke Patients Only)       Balance Overall balance assessment: Needs assistance Sitting-balance support: Feet supported Sitting balance-Leahy Scale: Fair     Standing balance support: During functional activity Standing balance-Leahy Scale: Poor Standing balance comment: 1 LOB w/ gait                             Pertinent Vitals/Pain Pain Assessment: No/denies pain    Home Living Family/patient expects to be discharged to:: Private residence Living Arrangements: Spouse/significant other Available Help at Discharge: Family(daughter will stay with pt when dc home) Type of Home: House(states lives in basement apartment in daughters house) Home Access: Stairs to enter Entrance Stairs-Rails: Can reach both Entrance Stairs-Number of Steps: 6 Home Layout: One level Home Equipment: Environmental consultant - 2 wheels;Wheelchair - manual;Shower seat(bed rail)      Prior Function Level of Independence: Independent         Comments: if had problem daughter was there     Hand Dominance  Extremity/Trunk Assessment   Upper Extremity Assessment Upper Extremity Assessment: Generalized weakness    Lower Extremity Assessment Lower Extremity Assessment: Generalized weakness       Communication   Communication: No difficulties  Cognition Arousal/Alertness: Awake/alert Behavior During Therapy: WFL for tasks assessed/performed Overall Cognitive Status: No  family/caregiver present to determine baseline cognitive functioning                                 General Comments: daughter states that she has altered mental status when hospitalizations but recoveres very quickly once home      General Comments      Exercises     Assessment/Plan    PT Assessment Patient needs continued PT services  PT Problem List Decreased strength;Decreased activity tolerance;Decreased balance;Decreased mobility;Decreased coordination;Decreased knowledge of use of DME;Decreased safety awareness       PT Treatment Interventions Gait training;Functional mobility training;Therapeutic activities;Therapeutic exercise;Stair training;Balance training;Neuromuscular re-education;Patient/family education    PT Goals (Current goals can be found in the Care Plan section)  Acute Rehab PT Goals Patient Stated Goal: to go home PT Goal Formulation: With patient Time For Goal Achievement: 09/02/19 Potential to Achieve Goals: Fair    Frequency Min 3X/week   Barriers to discharge        Co-evaluation               AM-PAC PT "6 Clicks" Mobility  Outcome Measure Help needed turning from your back to your side while in a flat bed without using bedrails?: A Little Help needed moving from lying on your back to sitting on the side of a flat bed without using bedrails?: A Little Help needed moving to and from a bed to a chair (including a wheelchair)?: A Little Help needed standing up from a chair using your arms (e.g., wheelchair or bedside chair)?: A Little Help needed to walk in hospital room?: A Little Help needed climbing 3-5 steps with a railing? : A Lot 6 Click Score: 17    End of Session Equipment Utilized During Treatment: Gait belt Activity Tolerance: Patient limited by fatigue;Patient limited by lethargy Patient left: in chair;with call bell/phone within reach;with chair alarm set Nurse Communication: Mobility status PT Visit Diagnosis:  Other abnormalities of gait and mobility (R26.89);Muscle weakness (generalized) (M62.81)    Time: [id number] PT Time Calculation (min) (ACUTE ONLY): 22 min   Charges:   PT Evaluation $PT Eval Moderate Complexity: 1 Mod PT Treatments $Gait Training: 8-22 mins       Horald Chestnut, PT    Delford Field 08/19/2019, 12:39 PM

## 2019-08-19 NOTE — Progress Notes (Signed)
Pt has been disoriented but cooperative and following directions majority of shift, however, pt is now agitated and refusing care, attempted a few times to give pt IV steroid, each time pt refused to let RN use IV and stated that 'I don't need those meds and if I need anything I'll go to my doctor,' attempted to reorient pt to current situation multiple times and pt not able to be reoriented at this time

## 2019-08-19 NOTE — Plan of Care (Signed)
  Problem: Respiratory: Goal: Will maintain a patent airway Outcome: Progressing Goal: Complications related to the disease process, condition or treatment will be avoided or minimized Outcome: Progressing   

## 2019-08-20 LAB — CBC WITH DIFFERENTIAL/PLATELET
Abs Immature Granulocytes: 0.02 10*3/uL (ref 0.00–0.07)
Basophils Absolute: 0 10*3/uL (ref 0.0–0.1)
Basophils Relative: 0 %
Eosinophils Absolute: 0 10*3/uL (ref 0.0–0.5)
Eosinophils Relative: 0 %
HCT: 33.7 % — ABNORMAL LOW (ref 36.0–46.0)
Hemoglobin: 11.2 g/dL — ABNORMAL LOW (ref 12.0–15.0)
Immature Granulocytes: 1 %
Lymphocytes Relative: 16 %
Lymphs Abs: 0.5 10*3/uL — ABNORMAL LOW (ref 0.7–4.0)
MCH: 30.4 pg (ref 26.0–34.0)
MCHC: 33.2 g/dL (ref 30.0–36.0)
MCV: 91.6 fL (ref 80.0–100.0)
Monocytes Absolute: 0.3 10*3/uL (ref 0.1–1.0)
Monocytes Relative: 9 %
Neutro Abs: 2.2 10*3/uL (ref 1.7–7.7)
Neutrophils Relative %: 74 %
Platelets: 168 10*3/uL (ref 150–400)
RBC: 3.68 MIL/uL — ABNORMAL LOW (ref 3.87–5.11)
RDW: 13.8 % (ref 11.5–15.5)
WBC: 3 10*3/uL — ABNORMAL LOW (ref 4.0–10.5)
nRBC: 0 % (ref 0.0–0.2)

## 2019-08-20 LAB — COMPREHENSIVE METABOLIC PANEL
ALT: 26 U/L (ref 0–44)
AST: 33 U/L (ref 15–41)
Albumin: 3.4 g/dL — ABNORMAL LOW (ref 3.5–5.0)
Alkaline Phosphatase: 48 U/L (ref 38–126)
Anion gap: 9 (ref 5–15)
BUN: 36 mg/dL — ABNORMAL HIGH (ref 8–23)
CO2: 22 mmol/L (ref 22–32)
Calcium: 9.1 mg/dL (ref 8.9–10.3)
Chloride: 105 mmol/L (ref 98–111)
Creatinine, Ser: 1.55 mg/dL — ABNORMAL HIGH (ref 0.44–1.00)
GFR calc Af Amer: 36 mL/min — ABNORMAL LOW (ref >60)
GFR calc non Af Amer: 31 mL/min — ABNORMAL LOW (ref >60)
Glucose, Bld: 226 mg/dL — ABNORMAL HIGH (ref 70–99)
Potassium: 4.4 mmol/L (ref 3.5–5.1)
Sodium: 136 mmol/L (ref 135–145)
Total Bilirubin: 1.1 mg/dL (ref 0.3–1.2)
Total Protein: 6.7 g/dL (ref 6.5–8.1)

## 2019-08-20 LAB — URINE CULTURE: Culture: >=100000 — AB

## 2019-08-20 LAB — D-DIMER, QUANTITATIVE: D-Dimer, Quant: 0.35 ug/mL-FEU (ref 0.00–0.50)

## 2019-08-20 LAB — GLUCOSE, CAPILLARY
Glucose-Capillary: 165 mg/dL — ABNORMAL HIGH (ref 70–99)
Glucose-Capillary: 156 mg/dL — ABNORMAL HIGH (ref 70–99)

## 2019-08-20 LAB — C-REACTIVE PROTEIN: CRP: 0.7 mg/dL (ref <1.0)

## 2019-08-20 NOTE — TOC Transition Note (Signed)
Transition of Care [facility]) - CM/SW Discharge Note Marvetta Gibbons RN, BSN Transitions of Care Unit 4E- RN Case Manager ([place]) [phone]   Patient Details  Name: Daisy Pearson MRN: [id number] Date of Birth: 05-01-1934  Transition of Care [facility]) CM/SW Contact:  Dawayne Patricia, RN Phone Number: 08/20/2019, 8:55 AM   Clinical Narrative:    Pt stable for transition home today, orders placed for HHPT/OT and DME-RW, call made to daughter Manuela Schwartz to discuss transition needs- choice offered for HHPT/OT needs- however daughter states they do not have a preference and to "just find an agency that can provide the needed services"- confirmed with daughter that pt already has RW and w/c at home- no other DME needs identified. Daughter plans to transport pt home via car. Call made to [facility] with [facility] referral- referral has been accepted for HHPT/OT needs. No DME needs.    Final next level of care: Home w Home Health Services Barriers to Discharge: No Barriers Identified   Patient Goals and CMS Choice   CMS Medicare.gov Compare Post Acute Care list provided to:: Patient Represenative (must comment)(daughter) Choice offered to / list presented to : Adult Children  Discharge Placement                 Home with [facility]      Discharge Plan and Services   Discharge Planning Services: CM Consult Post Acute Care Choice: Home Health          DME Arranged: Walker rolling(pt has one already at home) DME Agency: NA       HH Arranged: PT, OT HH Agency: [place] Date [facility] Agency Contacted: 08/20/19 Time [place]: [phone] Representative spoke with at [place]: [place] ([place]) Interventions     Readmission Risk Interventions Readmission Risk Prevention Plan 08/20/2019  Transportation Screening Complete  PCP or Specialist Appt within 3-5 Days Complete  HRI or [place] Complete  Social Work Consult  for [place] Planning/Counseling Complete  Palliative Care Screening Not Applicable  Medication Review Press photographer) Complete  Some recent data might be hidden

## 2019-08-20 NOTE — Progress Notes (Signed)
Several attempts to administer remdsevir, had a hard time starting IV, first IV occluded, second IV infiltrated. Final attempt successful. Remdesivir administered. Discharge process completed. Patients daughter contacted. Patient brought to her daughter's car via wheel chair.   Patients narcotics given to daughter. Counted by pharmacy and placed in plastic bag.

## 2019-08-20 NOTE — Discharge Summary (Addendum)
Physician Discharge Summary  Daisy Pearson [id number] DOB: 1934/04/12 DOA: 08/17/2019  PCP: Karleen Hampshire., MD  Admit date: 08/17/2019 Discharge date: 08/20/2019  Admitted From: Home Disposition: Home   Recommendations for Outpatient Follow-up:  1. Follow up with PCP in 1-2 weeks 2. Please obtain CMP/CBC in one week 3. Follow up urine culture and susceptibilities (GNR without speciation at time of discharge, blood cultures NGx3D)  Home Health: PT, OT Equipment/Devices: Rolling walker Discharge Condition: Stable, improved CODE STATUS: Full Diet recommendation: Heart healthy, carb-modified  Brief/Interim Summary: Daisy Pearson [phone] y.o.femalewith a history of CAD s/p CABG x2 and AVR 2014, AFib, CHB s/p PPM on pradaxa, CVA, IDT2DM, stage III-IV CKD, among others who [facility] with altered mental statu and decreased per oral intake for 4 days.She was hypoxemic with opacities bilaterally on CXR. Levaquin was given, and after SARS-CoV-2 testing returned positive remdesivir and steroids were started and the patient was admitted to [facility] 1/22. Fosfomycin was also given for UTI without sepsis. Hypoxemia quickly resolved. Hospitalization has been complicated by delirium and steroid-induced hyperglycemia. Due to significant improvement and normalization of inflammatory markers as well as resolution of hypoxemia, steroids were stopped. She remains significantly clinically improved after 3rd dose of remdesivir, near functional baseline and will be discharged home with home health services having confirmed she will have transportation to complete 5 days of remdesivir in the outpatient infusion center.  Discharge Diagnoses:  Principal Problem:   Acute respiratory failure due to COVID-19 [facility]) Active Problems:   Type 2 diabetes mellitus ([place])   Essential hypertension   A-fib (HCC)   Acute encephalopathy   ARF (acute renal failure) (HCC)  Acute hypoxemic respiratory  failure due to covid-19 pneumonia: SARS-CoV-2 PCRpositive 1/21. - Continue remdesivir 1/22 - 1/26. LFTs have remained normal. - With delirium, hyperglycemia, UTI, and rapid resolution of hypoxemia, we will stop steroids - CRP minimally elevated and improved. PCT reassuringly negative. - Bilateral opacities on CXR w/recommendation to repeat radiographic surveillance after resolution. - Vitamin C, zinc - Encourage OOB, IS, FV, and awake proning if able - Tylenol and antitussives prn - Continue airborne, contact precautions while admitted. Isolation period would be recommended for 21 days from positive testing. - Maintain euvolemia, currently appears dehydrated.  - Avoid NSAIDs  IDT2DM:Well-controlled chronically with HbA1c 6.4% 08/09/2019 but now steroid-induced hyperglycemia - Continue levemir(18u BID home dose)+ SSI(20u TIDWC home dose), add 10u mealtime insulin for elevated postprandials, no hypoglycemia noted.  Acute metabolic encephalopathy: CT head shows chronic atrophy, microvascular disease without acute abnormality. Likely multifactorial due to UTI, covid-19 and dehydration/renal failure. Ammonia wnl.  - Delirium precautions - Continue home medications, consider titration of seroquel.  MildAKI on stage IIIbCKD: SCr 2 on admission from baseline of 1.5 to which creatinine has returned. Note stage IV CKD in previous notes for putative recent baseline of 1.8-1.9 as outpatient since at least Oct 2020, though this may overestimate renal insufficiency. - Avoid nephrotoxins, hypotension, monitor Cr regularly - Now that back to baseline and taking good po, we will restart home ACE  Chronic atrial fibrillation s/p ablation and PPM, CAD s/p CABG: No chest pain, HS troponin wnl: - Continue metoprolol, amiodarone, pradaxa  HTN:  - Norvasc, metoprolol, ACEi  [id number] LDL was 68, HDL 64. - Statin  History of CVA:  - Continue anticoagulation, statin, risk factor  control.  OSA: On BiPAP.  Chronic pain syndrome:At SI joint/lower back - Consider holding buprenorphine(pain management prescribedbuprenorphine HCL (BELBUCA)458mcg film inside cheek  q12h, up from 34mcg dose 12/29)if sedation noted/encephalopathy continues, denies current pain. - Continue metaxalone (long term medication)  UTI: Patient has occasional UTIs in the past with most symptoms related to confusion. Culture growing GNRs (NOS at time of DC). Daughter reports efficacy of bactrim in the past, though with her elevated creatinine, this is less desirable, which also goes for FQs. Patient has a true PCN allergy, so we will avoid PCN/cephalosporins.  - Gave fosfomycin x1 (no evidence of sepsis), monitor urine culture data at follow up.  Discharge Instructions Discharge Instructions    Diet - low sodium heart healthy   Complete by: As directed    Discharge instructions   Complete by: As directed    You are being discharged from the hospital after treatment for covid-19 infection. You are felt to be stable enough to no longer require inpatient monitoring, testing, and treatment, though you will need to follow the recommendations below: - Continue taking remdesivir at the outpatient infusion center. You will return on Monday and Tuesday for these treatments to the front entrance of this hospital. The security guard can direct you to this entrance and will radio for our staff to come get you from the car.  - Since you do not require oxygen, and your inflammatory markers have normalized, no further treatments with steroids are necessary. - Per CDC guidelines, you will need to remain in isolation for 21 days from your first positive covid test. - Do not take NSAID medications (including, but not limited to, ibuprofen, advil, motrin, naproxen, aleve, goody's powder, etc.) - Follow up with your doctor in the next week via telehealth or seek medical attention right away if your symptoms get  WORSE.  - Consider donating plasma after you have recovered (either 14 days after a negative test or 28 days after symptoms have completely resolved) because your antibodies to this virus may be helpful to give to others with life-threatening infections. Please go to the website www.[email or web address] if you would like to consider volunteering for plasma donation.    Directions for you at home:  Wear a facemask You should wear a facemask that covers your nose and mouth when you are in the same room with other people and when you visit a healthcare provider. People who live with or visit you should also wear a facemask while they are in the same room with you.  Separate yourself from other people in your home As much as possible, you should stay in a different room from other people in your home. Also, you should use a separate bathroom, if available.  Avoid sharing household items You should not share dishes, drinking glasses, cups, eating utensils, towels, bedding, or other items with other people in your home. After using these items, you should wash them thoroughly with soap and water.  Cover your coughs and sneezes Cover your mouth and nose with a tissue when you cough or sneeze, or you can cough or sneeze into your sleeve. Throw used tissues in a lined trash can, and immediately wash your hands with soap and water for at least 20 seconds or use an alcohol-based hand rub.  Wash your [facility] your hands often and thoroughly with soap and water for at least 20 seconds. You can use an alcohol-based hand sanitizer if soap and water are not available and if your hands are not visibly dirty. Avoid touching your eyes, nose, and mouth with unwashed hands.  Directions for those who live with, or provide  care at home for you:  Limit the number of people who have contact with the patient If possible, have only one caregiver for the patient. Other household members should stay in another home or  place of residence. If this is not possible, they should stay in another room, or be separated from the patient as much as possible. Use a separate bathroom, if available. Restrict visitors who do not have an essential need to be in the home.  Ensure good ventilation Make sure that shared spaces in the home have good air flow, such as from an air conditioner or an opened window, weather permitting.  Wash your hands often Wash your hands often and thoroughly with soap and water for at least 20 seconds. You can use an alcohol based hand sanitizer if soap and water are not available and if your hands are not visibly dirty. Avoid touching your eyes, nose, and mouth with unwashed hands. Use disposable paper towels to dry your hands. If not available, use dedicated cloth towels and replace them when they become wet.  Wear a facemask and gloves Wear a disposable facemask at all times in the room and gloves when you touch or have contact with the patient's blood, body fluids, and/or secretions or excretions, such as sweat, saliva, sputum, nasal mucus, vomit, urine, or feces.  Ensure the mask fits over your nose and mouth tightly, and do not touch it during use. Throw out disposable facemasks and gloves after using them. Do not reuse. Wash your hands immediately after removing your facemask and gloves. If your personal clothing becomes contaminated, carefully remove clothing and launder. Wash your hands after handling contaminated clothing. Place all used disposable facemasks, gloves, and other waste in a lined container before disposing them with other household waste. Remove gloves and wash your hands immediately after handling these items.  Do not share dishes, glasses, or other household items with the patient Avoid sharing household items. You should not share dishes, drinking glasses, cups, eating utensils, towels, bedding, or other items with a patient who is confirmed to have, or being evaluated  for, COVID-19 infection. After the person uses these items, you should wash them thoroughly with soap and water.  Wash laundry thoroughly Immediately remove and wash clothes or bedding that have blood, body fluids, and/or secretions or excretions, such as sweat, saliva, sputum, nasal mucus, vomit, urine, or feces, on them. Wear gloves when handling laundry from the patient. Read and follow directions on labels of laundry or clothing items and detergent. In general, wash and dry with the warmest temperatures recommended on the label.  Clean all areas the individual has used often Clean all touchable surfaces, such as counters, tabletops, doorknobs, bathroom fixtures, toilets, phones, keyboards, tablets, and bedside tables, every day. Also, clean any surfaces that may have blood, body fluids, and/or secretions or excretions on them. Wear gloves when cleaning surfaces the patient has come in contact with. Use a diluted bleach solution (e.g., dilute bleach with 1 part bleach and 10 parts water) or a household disinfectant with a label that says EPA-registered for coronaviruses. To make a bleach solution at home, add 1 tablespoon of bleach to 1 quart (4 cups) of water. For a larger supply, add  cup of bleach to 1 gallon (16 cups) of water. Read labels of cleaning products and follow recommendations provided on product labels. Labels contain instructions for safe and effective use of the cleaning product including precautions you should take when applying the product, such  as wearing gloves or eye protection and making sure you have good ventilation during use of the product. Remove gloves and wash hands immediately after cleaning.  Monitor yourself for signs and symptoms of illness Caregivers and household members are considered close contacts, should monitor their health, and will be asked to limit movement outside of the home to the extent possible. Follow the monitoring steps for close contacts  listed on the symptom monitoring form.  If you have additional questions, contact your local health department or call the epidemiologist on call at [phone] (available 24/7). This guidance is subject to change. For the most up-to-date guidance from [facility], please refer to their website: [email or web address].pl   Increase activity slowly   Complete by: As directed    MyChart COVID-19 home monitoring program   Complete by: Aug 20, 2019    Is the patient willing to use the [facility] for home monitoring?: Yes     Allergies as of 08/20/2019      Reactions   Esomeprazole Sodium Swelling   Penicillins Anaphylaxis, Rash   Has patient had a PCN reaction causing immediate rash, facial/tongue/throat swelling, SOB or lightheadedness with hypotension: Yes Has patient had a PCN reaction causing severe rash involving mucus membranes or skin necrosis: No Has patient had a PCN reaction that required hospitalization: No Has patient had a PCN reaction occurring within the last 10 years: No If all of the above answers are "NO", then may proceed with Cephalosporin use.   Clindamycin/lincomycin Hives      Hydrocodone Other (See Comments)   Makes her crazy   Nsaids Hives, Other (See Comments)   kidney failure   Valsartan Swelling   Hydrocodone-acetaminophen Anxiety   Other reaction(s): Confusion (intolerance), Malaise (intolerance)   Prilosec [omeprazole] Swelling, Rash      Medication List    STOP taking these medications   enoxaparin 150 MG/ML injection Commonly known as: LOVENOX   Jantoven 4 MG tablet Generic drug: warfarin   polyethylene glycol 17 g packet Commonly known as: MIRALAX / GLYCOLAX   senna-docusate 8.6-50 MG tablet Commonly known as: Senokot-S     TAKE these medications   acetaminophen 325 MG tablet Commonly known as: TYLENOL Take 650 mg by mouth every 6 (six) hours as needed for headache (pain).    alendronate 70 MG tablet Commonly known as: FOSAMAX Take 70 mg by mouth every Saturday. Take with a full glass of water on an empty stomach.   amiodarone 200 MG tablet Commonly known as: PACERONE Take 200 mg by mouth daily.   amLODipine 5 MG tablet Commonly known as: NORVASC Take 1 tablet (5 mg total) by mouth daily.   Apidra SoloStar 100 UNIT/ML Solostar Pen Generic drug: Insulin Glulisine Inject 12 Units into the skin See admin instructions. Inject 12 units subcutaneously three times daily before meals - plus adjustment if CBG >200 - 1 unit for every 20   atorvastatin 40 MG tablet Commonly known as: LIPITOR Take 40 mg by mouth daily.   Belbuca 300 MCG Film Generic drug: Buprenorphine HCl Place 1 Film inside cheek 2 (two) times daily.   calcitRIOL 0.5 MCG capsule Commonly known as: ROCALTROL Take 0.5 mcg by mouth daily.   Cerefolin NAC 6-2-600 MG Tabs Take 1 tablet by mouth daily.   cetirizine 10 MG tablet Commonly known as: ZYRTEC Take 10 mg by mouth daily as needed for allergies.   dabigatran 75 MG Caps capsule Commonly known as: PRADAXA Take 75 mg by mouth 2 (  two) times daily.   enalapril 2.5 MG tablet Commonly known as: VASOTEC Take 2.5 mg by mouth daily. What changed: Another medication with the same name was removed. Continue taking this medication, and follow the directions you see here.   EpiPen 2-Pak 0.3 mg/0.3 mL Soaj injection Generic drug: EPINEPHrine Inject 0.3 mg into the muscle once as needed (severe allergic reaction).   ipratropium 0.06 % nasal spray Commonly known as: ATROVENT Place 2 sprays into both nostrils 4 (four) times daily as needed. Affected nostril   Levemir FlexTouch 100 UNIT/ML Pen Generic drug: Insulin Detemir Inject 18 Units into the skin 2 (two) times daily.   metaxalone 800 MG tablet Commonly known as: SKELAXIN Take 800 mg by mouth 3 (three) times daily as needed for muscle spasms.   metoprolol tartrate 25 MG  tablet Commonly known as: LOPRESSOR Take 25 mg by mouth 2 (two) times daily.   mirtazapine 15 MG tablet Commonly known as: REMERON Take 15 mg by mouth at bedtime.   nitroGLYCERIN 0.4 MG SL tablet Commonly known as: NITROSTAT Place 0.4 mg under the tongue every 5 (five) minutes as needed for chest pain.   OMEGA 3 PO Take 1 capsule by mouth daily.   oxyCODONE-acetaminophen 5-325 MG tablet Commonly known as: PERCOCET/ROXICET Take 1 tablet by mouth 2 (two) times daily as needed for severe pain.   QUEtiapine 25 MG tablet Commonly known as: SEROQUEL Take 25 mg by mouth at bedtime.   Vitamin D 50 MCG (2000 UT) tablet Take 4,000 Units by mouth daily.   Voltaren 1 % Gel Generic drug: diclofenac Sodium Apply 2 g topically 2 (two) times daily as needed (joint pain).            Durable Medical Equipment  (From admission, onward)         Start     Ordered   08/20/19 0707  For home use only DME Walker rolling  Once    Question Answer Comment  Walker: With 5 Inch Wheels   Patient needs a walker to treat with the following condition Gait instability      08/20/19 0706         Follow-up Information    Karleen Hampshire., MD. Schedule an appointment as soon as possible for a visit.   Specialty: Internal Medicine Contact information: [id number] [place] [address] [phone]        [facility], [facility] Follow up.   Specialty: [place] Why: HHPT/OT arranged- they will call you to set up home visits Contact information: [place] [address] [phone]        [place]. Go to.   Why: Remdesivir infusion at 1130AM on Monday 1/25 and Tuesday 1/26.  Drive to the security guard and tell them you are here for an infusion. Contact information: [place] [phone]         Allergies  Allergen Reactions  . Esomeprazole Sodium  Swelling  . Penicillins Anaphylaxis and Rash    Has patient had a PCN reaction causing immediate rash, facial/tongue/throat swelling, SOB or lightheadedness with hypotension: Yes Has patient had a PCN reaction causing severe rash involving mucus membranes or skin necrosis: No Has patient had a PCN reaction that required hospitalization: No Has patient had a PCN reaction occurring within the last 10 years: No If all of the above answers are "NO", then may proceed with  Cephalosporin use.  . Clindamycin/Lincomycin Hives       . Hydrocodone Other (See Comments)    Makes her crazy  . Nsaids Hives and Other (See Comments)    kidney failure  . Valsartan Swelling  . Hydrocodone-Acetaminophen Anxiety    Other reaction(s): Confusion (intolerance), Malaise (intolerance)  . Prilosec [Omeprazole] Swelling and Rash    Consultations:  None  Procedures/Studies: DG Chest 2 View  Result Date: 08/17/2019 CLINICAL DATA:  Confusion and lethargy.  Altered mental status. EXAM: CHEST - 2 VIEW COMPARISON:  02/14/2017 FINDINGS: Low lung volumes. Focal airspace disease in the right mid lung is compatible with pneumonia. There is a small nodular opacity in the left mid lung superimposed on the anterior second rib. Interstitial markings are diffusely coarsened with chronic features. The cardio pericardial silhouette is enlarged. Right permanent pacemaker noted. The visualized bony structures of the thorax are intact. Status post vertebral augmentation in the region of the thoracolumbar junction. IMPRESSION: Focal airspace disease right mid lung with focal nodular opacity in the left mid lung. Features likely reflect multifocal pneumonia, but close follow-up recommended to ensure resolution. Electronically Signed   By: Misty Stanley M.D.   On: 08/17/2019 15:41   CT Head Wo Contrast  Result Date: 08/17/2019 CLINICAL DATA:  86 year old female with concern for intracranial hemorrhage. EXAM: CT HEAD WITHOUT CONTRAST  TECHNIQUE: Contiguous axial images were obtained from the base of the skull through the vertex without intravenous contrast. COMPARISON:  None. FINDINGS: Brain: Moderate age-related atrophy and chronic microvascular ischemic changes. There is no acute intracranial hemorrhage. No mass effect or midline shift. No extra-axial fluid collection. Vascular: No hyperdense vessel or unexpected calcification. Skull: Normal. Negative for fracture or focal lesion. Sinuses/Orbits: No acute finding. Other: None IMPRESSION: 1. No acute intracranial hemorrhage. 2. Age-related atrophy and chronic microvascular ischemic changes. Electronically Signed   By: Anner Crete M.D.   On: 08/17/2019 22:28     Subjective: Feels well, confused and restless again last night. High blood pressures taken while patient actively agitated. Denies chest pain, dyspnea, HA, leg swelling/tenderness.   Discharge Exam: Vitals:   08/20/19 0258 08/20/19 0812  BP: (!) 164/71 (!) 170/99  Pulse: 61 69  Resp: 17 18  Temp: 98 F (36.7 C) 98 F (36.7 C)  SpO2: 99% 100%   General: Pt is alert, awake, not in acute distress Cardiovascular: RRR, S1/S2 +, no rubs, no gallops Respiratory: CTA bilaterally, no wheezing, no rhonchi Abdominal: Soft, NT, ND, bowel sounds + Extremities: No edema, no cyanosis  Labs: BNP (last 3 results) No results for input(s): BNP in the last 8760 hours. Basic Metabolic Panel: Recent Labs  Lab 08/17/19 1455 08/19/19 0247 08/20/19 0433  NA 136 137 136  K 4.5 4.6 4.4  CL 101 106 105  CO2 21* 23 22  GLUCOSE 271* 234* 226*  BUN 26* 35* 36*  CREATININE 2.02* 1.52* 1.55*  CALCIUM 9.5 9.0 9.1   Liver Function Tests: Recent Labs  Lab 08/17/19 1455 08/19/19 0247 08/20/19 0433  AST 37 28 33  ALT 24 23 26   ALKPHOS 52 45 48  BILITOT 0.9 0.8 1.1  PROT 6.6 6.3* 6.7  ALBUMIN 3.4* 3.1* 3.4*   No results for input(s): LIPASE, AMYLASE in the last 168 hours. Recent Labs  Lab 08/17/19 2120  AMMONIA 9    CBC: Recent Labs  Lab 08/17/19 1455 08/17/19 2120 08/19/19 0247 08/20/19 0433  WBC 3.6* 3.0* 1.7* 3.0*  NEUTROABS  --  2.3 1.1* 2.2  HGB 12.0 12.0 10.7* 11.2*  HCT 38.4 37.1 32.7* 33.7*  MCV 97.2 95.4 92.6 91.6  PLT 154 138* 136* 168   Cardiac Enzymes: No results for input(s): CKTOTAL, CKMB, CKMBINDEX, TROPONINI in the last 168 hours. BNP: Invalid input(s): POCBNP CBG: Recent Labs  Lab 08/19/19 0743 08/19/19 1206 08/19/19 1622 08/19/19 2045 08/20/19 0835  GLUCAP 185* 321* 313* 347* 165*   D-Dimer Recent Labs    08/19/19 0247 08/20/19 0433  DDIMER 0.38 0.35   Hgb A1c No results for input(s): HGBA1C in the last 72 hours. Lipid Profile No results for input(s): CHOL, HDL, LDLCALC, TRIG, CHOLHDL, LDLDIRECT in the last 72 hours. Thyroid function studies Recent Labs    08/17/19 2120  TSH 2.665   Anemia work up Recent Labs    08/18/19 0312  FERRITIN 65   Urinalysis    Component Value Date/Time   COLORURINE YELLOW 08/17/2019 2035   APPEARANCEUR HAZY (A) 08/17/2019 2035   LABSPEC 1.016 08/17/2019 2035   PHURINE 5.0 08/17/2019 2035   GLUCOSEU 50 (A) 08/17/2019 2035   HGBUR NEGATIVE 08/17/2019 2035   BILIRUBINUR NEGATIVE 08/17/2019 2035   KETONESUR NEGATIVE 08/17/2019 2035   PROTEINUR NEGATIVE 08/17/2019 2035   NITRITE NEGATIVE 08/17/2019 2035   LEUKOCYTESUR TRACE (A) 08/17/2019 2035    Microbiology Recent Results (from the past 240 hour(s))  SARS CORONAVIRUS 2 (TAT 6-24 HRS) Nasopharyngeal Nasopharyngeal Swab     Status: Abnormal   Collection Time: 08/17/19  8:35 PM   Specimen: Nasopharyngeal Swab  Result Value Ref Range Status   SARS Coronavirus 2 POSITIVE (A) NEGATIVE Final    Comment: RESULT CALLED TO, READ BACK BY AND VERIFIED WITH: Jacklyn Shell, RN AT [id number] ON 08/18/2019 BY SAINVILUS S (NOTE) SARS-CoV-2 target nucleic acids are DETECTED. The SARS-CoV-2 RNA is generally detectable in upper and lower respiratory specimens during the acute phase  of infection. Positive results are indicative of the presence of SARS-CoV-2 RNA. Clinical correlation with patient history and other diagnostic information is  necessary to determine patient infection status. Positive results do not rule out bacterial infection or co-infection with other viruses.  The expected result is Negative. Fact Sheet for Patients: SugarRoll.be Fact Sheet for Healthcare Providers: https://www.woods-mathews.com/ This test is not yet approved or cleared by the [place] FDA and  has been authorized for detection and/or diagnosis of SARS-CoV-2 by FDA under an Emergency Use Authorization (EUA). This EUA will remain  in effect (meaning this test can b e used) for the duration of the COVID-19 declaration under Section 564(b)(1) of the Act, 21 U.S.C. section 360bbb-3(b)(1), unless the authorization is terminated or revoked sooner. Performed at [facility], [place] [address] [place], [place] [address]   Blood Culture (routine x 2)     Status: None (Preliminary result)   Collection Time: 08/17/19  9:21 PM   Specimen: BLOOD  Result Value Ref Range Status   Specimen Description BLOOD LEFT ANTECUBITAL  Final   Special Requests   Final    BOTTLES DRAWN AEROBIC AND ANAEROBIC Blood Culture adequate volume   Culture   Final    NO GROWTH 3 DAYS Performed at [facility], [place] [address] [place], [place] [address]    Report Status PENDING  Incomplete  Blood Culture (routine x 2)     Status: None (Preliminary result)   Collection Time: 08/17/19  9:37 PM   Specimen: BLOOD RIGHT HAND  Result Value Ref Range Status   Specimen Description BLOOD RIGHT HAND  Final   Special Requests   Final    BOTTLES DRAWN AEROBIC AND ANAEROBIC Blood Culture adequate volume   Culture   Final    NO GROWTH 3 DAYS Performed at [facility], [address], [place], [place] [address]    Report Status PENDING  Incomplete  Urine culture      Status: Abnormal (Preliminary result)   Collection Time: 08/17/19  9:37 PM   Specimen: Urine, Catheterized  Result Value Ref Range Status   Specimen Description URINE, CATHETERIZED  Final   Special Requests NONE  Final   Culture (A)  Final    >=100,000 COLONIES/mL GRAM NEGATIVE RODS IDENTIFICATION AND SUSCEPTIBILITIES TO FOLLOW Performed at [facility], [place] [address], [place], [place] [address]    Report Status PENDING  Incomplete  Respiratory Panel by RT PCR (Flu A&B, Covid) - Nasopharyngeal Swab     Status: Abnormal   Collection Time: 08/17/19 11:42 PM   Specimen: Nasopharyngeal Swab  Result Value Ref Range Status   SARS Coronavirus 2 by RT PCR POSITIVE (A) NEGATIVE Final    Comment: RESULT CALLED TO, READ BACK BY AND VERIFIED WITH: WOODY TN 08/18/19 0106 JDW (NOTE) SARS-CoV-2 target nucleic acids are DETECTED. SARS-CoV-2 RNA is generally detectable in upper respiratory specimens  during the acute phase of infection. Positive results are indicative of the presence of the identified virus, but do not rule out bacterial infection or co-infection with other pathogens not detected by the test. Clinical correlation with patient history and other diagnostic information is necessary to determine patient infection status. The expected result is Negative. Fact Sheet for Patients:  PinkCheek.be Fact Sheet for Healthcare Providers: GravelBags.it This test is not yet approved or cleared by the [place] FDA and  has been authorized for detection and/or diagnosis of SARS-CoV-2 by FDA under an Emergency Use Authorization (EUA).  This EUA will remain in effect (meaning this test can be used) for the dur ation of  the COVID-19 declaration under Section 564(b)(1) of the Act, 21 U.S.C. section 360bbb-3(b)(1), unless the authorization is terminated or revoked sooner.    Influenza A by PCR NEGATIVE NEGATIVE Final    Influenza B by PCR NEGATIVE NEGATIVE Final    Comment: (NOTE) The Xpert Xpress SARS-CoV-2/FLU/RSV assay is intended as an aid in  the diagnosis of influenza from Nasopharyngeal swab specimens and  should not be used as a sole basis for treatment. Nasal washings and  aspirates are unacceptable for Xpert Xpress SARS-CoV-2/FLU/RSV  testing. Fact Sheet for Patients: PinkCheek.be Fact Sheet for Healthcare Providers: GravelBags.it This test is not yet approved or cleared by the [place] FDA and  has been authorized for detection and/or diagnosis of SARS-CoV-2 by  FDA under an Emergency Use Authorization (EUA). This EUA will remain  in effect (meaning this test can be used) for the duration of the  Covid-19 declaration under Section 564(b)(1) of the Act, 21  U.S.C. section 360bbb-3(b)(1), unless the authorization is  terminated or revoked. Performed at [facility], [place] [address], [place], [place] [address]     Time coordinating discharge: Approximately 40 minutes  [name], MD  [facility] 08/20/2019, 10:49 AM

## 2019-08-20 NOTE — Progress Notes (Signed)
Patient scheduled for outpatient Remdesivir infusion at 1130AM on Monday 1/25 and Tuesday 1/26.  Please advise them to report to [facility] at [address]  Drive to the security guard and tell them you are here for an infusion. They will direct you to the front entrance where we will come and get you.  For questions call [phone].  Thanks

## 2019-08-20 NOTE — Plan of Care (Signed)
Patient remains Alert to self only, intermittent confusion, attempted to get up multiple times to use BSC w/o using call bell, sats >90 throughout shift on RA, safety precautions in place, possible d/c home w/ HH, continue plan of care Problem: Education: Goal: Knowledge of risk factors and measures for prevention of condition will improve Outcome: Progressing   Problem: Coping: Goal: Psychosocial and spiritual needs will be supported Outcome: Progressing   Problem: Respiratory: Goal: Will maintain a patent airway Outcome: Progressing Goal: Complications related to the disease process, condition or treatment will be avoided or minimized Outcome: Progressing

## 2019-08-20 NOTE — Progress Notes (Signed)
Occupational Therapy Evaluation Patient Details Name: Daisy Pearson MRN: [id number] DOB: 10/27/1933 Today's Date: 08/20/2019    History of Present Illness 86 y.o. female with a history of CAD s/p CABG x2 and AVR 2014, AFib, CHB s/p PPM on pradaxa, CVA, IDT2DM, stage IV CKD, among others who [facility] with altered mental status, decreased per oral intake for 4 days. Patient admitted earlier this morning for covid-19 pneumonia, dehydration, and seen later by additional hospitalist, transfer to [facility] initiated.   Clinical Impression   Unsure of accuracy of PLOF as pt is confused. PTA pt lived with her daughter, able to ambulate without an assistive device. Pt reports being independent in ADLs with daughter assisting as needed. Pt able to ambulate to/from bathroom with RW and variable supervision to min guard to ensure balance and safety. SpO2 maintained in 90s throughout on room air with no reports of shortness of breath. Pt had periods of agitation during session refusing to participate in some self-care tasks. Pt demonstrates decreased strength, endurance, balance, standing tolerance, activity tolerance, and safety awareness impacting ability to complete self-care and functional transfer tasks. Recommend skilled OT services to address above deficits in order to promote function and prevent further decline.     Follow Up Recommendations  Home health OT;Supervision/Assistance - 24 hour    Equipment Recommendations  None recommended by OT    Recommendations for Other Services       Precautions / Restrictions Precautions Precautions: Fall Restrictions Weight Bearing Restrictions: No      Mobility Bed Mobility Overal bed mobility: Modified Independent             General bed mobility comments: HOB elevated, use of bedrail  Transfers Overall transfer level: Needs assistance Equipment used: Rolling walker (2 wheeled) Transfers: Sit to/from [employer]  Transfers Sit to Stand: Supervision;Min guard Stand pivot transfers: Supervision;Min guard       General transfer comment: to ensure balance and safety    Balance Overall balance assessment: Needs assistance Sitting-balance support: Feet supported Sitting balance-Leahy Scale: Good       Standing balance-Leahy Scale: Fair                             ADL either performed or assessed with clinical judgement   ADL Overall ADL's : Needs assistance/impaired Eating/Feeding: Supervision/ safety;Set up;Sitting   Grooming: Set up;Supervision/safety;Sitting   Upper Body Bathing: Set up;Supervision/ safety;Sitting   Lower Body Bathing: Minimal assistance;Supervison/ safety;Sitting/lateral leans;Sit to/from stand   Upper Body Dressing : Supervision/safety;Set up;Sitting   Lower Body Dressing: Supervision/safety;Minimal assistance;Sit to/from stand;Sitting/lateral leans   Toilet Transfer: Minimal Teacher, English as a foreign language;Ambulation   Toileting- Clothing Manipulation and Hygiene: Supervision/safety;Min guard;Sit to/from stand       Functional mobility during ADLs: Supervision/safety;Min guard;Rolling walker General ADL Comments: Pt able to ambulate to/from bathroom with RW and variable supervision to min guard to ensure balance and safety. Noted 0 instances of LOB.     Vision Baseline Vision/History: No visual deficits       Perception     Praxis      Pertinent Vitals/Pain Pain Assessment: No/denies pain     Hand Dominance Right   Extremity/Trunk Assessment Upper Extremity Assessment Upper Extremity Assessment: Generalized weakness   Lower Extremity Assessment Lower Extremity Assessment: Defer to PT evaluation       Communication Communication Communication: No difficulties   Cognition Arousal/Alertness: Awake/alert Behavior During Therapy: Agitated(Per RN, pt has  been attempting to get out of bed all night) Overall Cognitive Status: No  family/caregiver present to determine baseline cognitive functioning                                 General Comments: Periods of agitation during evaluation with pt refusing to participate in some tasks. Daughter states that she has altered mental status when hospitalizations but recoveres very quickly once home   General Comments  Pt on room air with SpO2 maintaining in 90s throughout.    Exercises     Shoulder Instructions      Home Living Family/patient expects to be discharged to:: Private residence Living Arrangements: Children(Daughter) Available Help at Discharge: Family(Daughter will stay with pt when dc home) Type of Home: House(states lives in basement apartment in daughters house) Home Access: Stairs to enter Technical brewer of Steps: 6 Entrance Stairs-Rails: Can reach both Harts: One level     Bathroom Shower/Tub: Teacher, early years/pre: [place]: Environmental consultant - 2 wheels;Wheelchair - manual;Shower seat          Prior Functioning/Environment Level of Independence: Independent        Comments: Pt reports that if she has a problem, her daughter can assist her. Pt reports being independent with ADLs and mobility with daughter and grandchildren assisting with IADLs.         OT Problem List: Decreased strength;Decreased activity tolerance;Impaired balance (sitting and/or standing);Decreased cognition;Decreased safety awareness;Cardiopulmonary status limiting activity      OT Treatment/Interventions: Self-care/ADL training;Therapeutic exercise;Neuromuscular education;Energy conservation;DME and/or AE instruction;Therapeutic activities;Patient/family education;Balance training    OT Goals(Current goals can be found in the care plan section) Acute Rehab OT Goals Patient Stated Goal: to go home Time For Goal Achievement: 09/03/19 Potential to Achieve Goals: Good ADL Goals Pt Will Perform Grooming: standing;with  modified independence Pt Will Perform Lower Body Bathing: with modified independence;sit to/from stand Pt Will Perform Lower Body Dressing: with modified independence;sitting/lateral leans;sit to/from stand Pt Will Transfer to Toilet: with modified independence;ambulating;regular height toilet Pt Will Perform Toileting - Clothing Manipulation and hygiene: with modified independence;sit to/from stand Additional ADL Goal #1: Pt to tolerate standing up to 10 min with modified independence, in preparation for ADLs.  OT Frequency: Min 3X/week   Barriers to D/C:            Co-evaluation              AM-PAC OT "6 Clicks" Daily Activity     Outcome Measure Help from another person eating meals?: A Little Help from another person taking care of personal grooming?: A Little Help from another person toileting, which includes using toliet, bedpan, or urinal?: A Little Help from another person bathing (including washing, rinsing, drying)?: A Little Help from another person to put on and taking off regular upper body clothing?: A Little Help from another person to put on and taking off regular lower body clothing?: A Little 6 Click Score: 18   End of Session Equipment Utilized During Treatment: Rolling walker Nurse Communication: Mobility status  Activity Tolerance: Patient tolerated treatment well Patient left: in chair;with call bell/phone within reach;with chair alarm set  OT Visit Diagnosis: Unsteadiness on feet (R26.81);Muscle weakness (generalized) (M62.81)                Time: [id number] OT Time Calculation (min): 34 min Charges:  OT General Charges $OT Visit:  1 Visit OT Evaluation $OT Eval Low Complexity: 1 Low OT Treatments $Self Care/Home Management : 8-22 mins  Mauri Brooklyn OTR/L [phone]   Mauri Brooklyn 08/20/2019, 8:32 AM

## 2019-08-20 NOTE — Discharge Instructions (Addendum)
Person Under Monitoring Name: Daisy Pearson  Location: [place] [address]   CORONAVIRUS DISEASE 2019 (COVID-19) Guidance for Persons Under Investigation You are being tested for the virus that causes coronavirus disease 2019 (COVID-19). Public health actions are necessary to ensure protection of your health and the health of others, and to prevent further spread of infection. COVID-19 is caused by a virus that can cause symptoms, such as fever, cough, and shortness of breath. The primary transmission from person to person is by coughing or sneezing. On August 25, 2018, the [place] announced a [employer] Emergency of International Concern and on August 26, 2018 the U.S. Department of Health and Human Services declared a public health emergency. If the virus that causesCOVID-19 spreads in the community, it could have severe public health consequences.  As a person under investigation for COVID-19, the [place] advises you to adhere to the following guidance until your test results are reported to you. If your test result is positive, you will receive additional information from your provider and your local health department at that time.   Remain at home until you are cleared by your health provider or public health authorities.   Keep a log of visitors to your home using the form provided. Any visitors to your home must be aware of your isolation status.  If you plan to move to a new address or leave the county, notify the local health department in your county.  Call a doctor or seek care if you have an urgent medical need. Before seeking medical care, call ahead and get instructions from the provider before arriving at the medical office, clinic or hospital. Notify them that you are being tested for the virus that causes COVID-19 so arrangements can be made, as necessary,  to prevent transmission to others in the healthcare setting. Next, notify the local health department in your county.  If a medical emergency arises and you need to call 911, inform the first responders that you are being tested for the virus that causes COVID-19. Next, notify the local health department in your county.  Adhere to all guidance set forth by the [place] for [facility] of patients that is based on guidance from the Center for Disease Control and Prevention with suspected or confirmed COVID-19. It is provided with this guidance for Persons Under Investigation.  Your health and the health of our community are our top priorities. Public Health officials remain available to provide assistance and counseling to you about COVID-19 and compliance with this guidance.  Provider: ____________________________________________________________ Date: ______/_____/_________  By signing below, you acknowledge that you have read and agree to comply with this Guidance for Persons Under Investigation. ______________________________________________________________ Date: ______/_____/_________  WHO DO I CALL? You can find a list of local health departments here: [email or web address] Health Department: ____________________________________________________________________ Contact Name: ________________________________________________________________________ Telephone: ___________________________________________________________________________  [facility], [place], Communicable Disease Branch COVID-19 Guidance for Persons Under Investigation March 7, 2020You are scheduled for an outpatient infusion of Remdesivir at 1130AM on Monday 1/25 and Tuesday 1/26.Marland Kitchen  Please report to [facility] at [address]  Drive to the security guard and tell them you are here for an infusion. They will  direct you to the front entrance where we will come and get you.  For questions call [phone].  Thanks

## 2019-08-21 ENCOUNTER — Ambulatory Visit (HOSPITAL_COMMUNITY)
Admission: RE | Admit: 2019-08-21 | Discharge: 2019-08-21 | Disposition: A | Discharge status: Home / Self Care | Payer: Medicare Other | Source: Ambulatory Visit | Attending: Pulmonary Disease | Admitting: Pulmonary Disease

## 2019-08-21 DIAGNOSIS — U071 COVID-19: Secondary | ICD-10-CM | POA: Insufficient documentation

## 2019-08-21 DIAGNOSIS — J1289 Other viral pneumonia: Secondary | ICD-10-CM | POA: Insufficient documentation

## 2019-08-21 MED ORDER — ALBUTEROL SULFATE HFA 108 (90 BASE) MCG/ACT IN AERS: 2.0000 | INHALATION_SPRAY | Freq: Once | RESPIRATORY_TRACT | Status: DC | PRN

## 2019-08-21 MED ORDER — SODIUM CHLORIDE 0.9 % IV SOLN: 100.0000 mg | Freq: Once | INTRAVENOUS | Status: AC

## 2019-08-21 MED ORDER — METHYLPREDNISOLONE SODIUM SUCC 125 MG IJ SOLR: 125.0000 mg | Freq: Once | INTRAMUSCULAR | Status: DC | PRN

## 2019-08-21 MED ORDER — EPINEPHRINE 0.3 MG/0.3ML IJ SOAJ: 0.3000 mg | Freq: Once | INTRAMUSCULAR | Status: DC | PRN

## 2019-08-21 MED ORDER — SODIUM CHLORIDE 0.9 % IV SOLN: INTRAVENOUS | Status: DC | PRN

## 2019-08-21 MED ORDER — SODIUM CHLORIDE 0.9 % IV SOLN
INTRAVENOUS | Status: AC
  Administered 2019-08-21: 100 mg via INTRAVENOUS
  Filled 2019-08-21: qty 20

## 2019-08-21 MED ORDER — DIPHENHYDRAMINE HCL 50 MG/ML IJ SOLN: 50.0000 mg | Freq: Once | INTRAMUSCULAR | Status: DC | PRN

## 2019-08-21 MED ORDER — FAMOTIDINE IN NACL 20-0.9 MG/50ML-% IV SOLN: 20.0000 mg | Freq: Once | INTRAVENOUS | Status: DC | PRN

## 2019-08-21 NOTE — Progress Notes (Signed)
  Diagnosis: COVID-19  Physician:dr wright  Procedure: Covid Infusion Clinic Med: remdesivir infusion.  Complications: No immediate complications noted.  Discharge: Discharged home   [place] 08/21/2019

## 2019-08-22 ENCOUNTER — Ambulatory Visit (HOSPITAL_COMMUNITY)
Admit: 2019-08-22 | Discharge: 2019-08-22 | Disposition: A | Discharge status: Home / Self Care | Payer: Medicare Other | Attending: Pulmonary Disease | Admitting: Pulmonary Disease

## 2019-08-22 DIAGNOSIS — U071 COVID-19: Secondary | ICD-10-CM | POA: Diagnosis not present

## 2019-08-22 LAB — CULTURE, BLOOD (ROUTINE X 2)
Culture: NO GROWTH
Culture: NO GROWTH
Special Requests: ADEQUATE
Special Requests: ADEQUATE

## 2019-08-22 MED ORDER — METHYLPREDNISOLONE SODIUM SUCC 125 MG IJ SOLR: 125.0000 mg | Freq: Once | INTRAMUSCULAR | Status: DC | PRN

## 2019-08-22 MED ORDER — FAMOTIDINE IN NACL 20-0.9 MG/50ML-% IV SOLN: 20.0000 mg | Freq: Once | INTRAVENOUS | Status: DC | PRN

## 2019-08-22 MED ORDER — ALBUTEROL SULFATE HFA 108 (90 BASE) MCG/ACT IN AERS: 2.0000 | INHALATION_SPRAY | Freq: Once | RESPIRATORY_TRACT | Status: DC | PRN

## 2019-08-22 MED ORDER — DIPHENHYDRAMINE HCL 50 MG/ML IJ SOLN: 50.0000 mg | Freq: Once | INTRAMUSCULAR | Status: DC | PRN

## 2019-08-22 MED ORDER — EPINEPHRINE 0.3 MG/0.3ML IJ SOAJ: 0.3000 mg | Freq: Once | INTRAMUSCULAR | Status: DC | PRN

## 2019-08-22 MED ORDER — SODIUM CHLORIDE 0.9 % IV SOLN: INTRAVENOUS | Status: DC | PRN

## 2019-08-22 MED ORDER — SODIUM CHLORIDE 0.9 % IV SOLN
100.0000 mg | Freq: Once | INTRAVENOUS | Status: AC
  Administered 2019-08-22: 100 mg via INTRAVENOUS

## 2019-08-22 MED ORDER — SODIUM CHLORIDE 0.9 % IV SOLN
INTRAVENOUS | Status: AC
  Filled 2019-08-22: qty 20

## 2021-05-27 ENCOUNTER — Emergency Department (HOSPITAL_COMMUNITY)
Admission: EM | Admit: 2021-05-27 | Discharge: 2021-05-27 | Disposition: A | Discharge status: Home / Self Care | Payer: Medicare Other | Attending: Emergency Medicine | Admitting: Emergency Medicine

## 2021-05-27 ENCOUNTER — Emergency Department (HOSPITAL_COMMUNITY): Payer: Medicare Other

## 2021-05-27 DIAGNOSIS — E1122 Type 2 diabetes mellitus with diabetic chronic kidney disease: Secondary | ICD-10-CM | POA: Insufficient documentation

## 2021-05-27 DIAGNOSIS — Z79899 Other long term (current) drug therapy: Secondary | ICD-10-CM | POA: Insufficient documentation

## 2021-05-27 DIAGNOSIS — R600 Localized edema: Secondary | ICD-10-CM | POA: Insufficient documentation

## 2021-05-27 DIAGNOSIS — D649 Anemia, unspecified: Secondary | ICD-10-CM | POA: Diagnosis not present

## 2021-05-27 DIAGNOSIS — Z950 Presence of cardiac pacemaker: Secondary | ICD-10-CM | POA: Insufficient documentation

## 2021-05-27 DIAGNOSIS — N184 Chronic kidney disease, stage 4 (severe): Secondary | ICD-10-CM | POA: Insufficient documentation

## 2021-05-27 DIAGNOSIS — Z8616 Personal history of COVID-19: Secondary | ICD-10-CM | POA: Insufficient documentation

## 2021-05-27 DIAGNOSIS — Z794 Long term (current) use of insulin: Secondary | ICD-10-CM | POA: Insufficient documentation

## 2021-05-27 DIAGNOSIS — Z951 Presence of aortocoronary bypass graft: Secondary | ICD-10-CM | POA: Insufficient documentation

## 2021-05-27 DIAGNOSIS — Z7901 Long term (current) use of anticoagulants: Secondary | ICD-10-CM | POA: Diagnosis not present

## 2021-05-27 DIAGNOSIS — I129 Hypertensive chronic kidney disease with stage 1 through stage 4 chronic kidney disease, or unspecified chronic kidney disease: Secondary | ICD-10-CM | POA: Diagnosis not present

## 2021-05-27 DIAGNOSIS — E1165 Type 2 diabetes mellitus with hyperglycemia: Secondary | ICD-10-CM | POA: Diagnosis not present

## 2021-05-27 DIAGNOSIS — R609 Edema, unspecified: Secondary | ICD-10-CM

## 2021-05-27 DIAGNOSIS — R0602 Shortness of breath: Secondary | ICD-10-CM | POA: Insufficient documentation

## 2021-05-27 LAB — COMPREHENSIVE METABOLIC PANEL
ALT: 25 U/L (ref 0–44)
AST: 23 U/L (ref 15–41)
Albumin: 3.6 g/dL (ref 3.5–5.0)
Alkaline Phosphatase: 58 U/L (ref 38–126)
Anion gap: 8 (ref 5–15)
BUN: 22 mg/dL (ref 8–23)
CO2: 24 mmol/L (ref 22–32)
Calcium: 9.8 mg/dL (ref 8.9–10.3)
Chloride: 103 mmol/L (ref 98–111)
Creatinine, Ser: 1.55 mg/dL — ABNORMAL HIGH (ref 0.44–1.00)
GFR, Estimated: 33 mL/min — ABNORMAL LOW (ref >60)
Glucose, Bld: 140 mg/dL — ABNORMAL HIGH (ref 70–99)
Potassium: 4.2 mmol/L (ref 3.5–5.1)
Sodium: 135 mmol/L (ref 135–145)
Total Bilirubin: 0.6 mg/dL (ref 0.3–1.2)
Total Protein: 6.2 g/dL — ABNORMAL LOW (ref 6.5–8.1)

## 2021-05-27 LAB — URINALYSIS, ROUTINE W REFLEX MICROSCOPIC
Bacteria, UA: NONE SEEN
Bilirubin Urine: NEGATIVE
Glucose, UA: NEGATIVE mg/dL
Ketones, ur: NEGATIVE mg/dL
Leukocytes,Ua: NEGATIVE
Nitrite: NEGATIVE
Protein, ur: NEGATIVE mg/dL
Specific Gravity, Urine: 1.01 (ref 1.005–1.030)
pH: 5 (ref 5.0–8.0)

## 2021-05-27 LAB — CBC WITH DIFFERENTIAL/PLATELET
Abs Immature Granulocytes: 0.05 10*3/uL (ref 0.00–0.07)
Basophils Absolute: 0.1 10*3/uL (ref 0.0–0.1)
Basophils Relative: 1 %
Eosinophils Absolute: 0.1 10*3/uL (ref 0.0–0.5)
Eosinophils Relative: 2 %
HCT: 36.6 % (ref 36.0–46.0)
Hemoglobin: 11.8 g/dL — ABNORMAL LOW (ref 12.0–15.0)
Immature Granulocytes: 1 %
Lymphocytes Relative: 22 %
Lymphs Abs: 2 10*3/uL (ref 0.7–4.0)
MCH: 29.9 pg (ref 26.0–34.0)
MCHC: 32.2 g/dL (ref 30.0–36.0)
MCV: 92.9 fL (ref 80.0–100.0)
Monocytes Absolute: 0.9 10*3/uL (ref 0.1–1.0)
Monocytes Relative: 9 %
Neutro Abs: 6.1 10*3/uL (ref 1.7–7.7)
Neutrophils Relative %: 65 %
Platelets: 242 10*3/uL (ref 150–400)
RBC: 3.94 MIL/uL (ref 3.87–5.11)
RDW: 13.6 % (ref 11.5–15.5)
WBC: 9.2 10*3/uL (ref 4.0–10.5)
nRBC: 0 % (ref 0.0–0.2)

## 2021-05-27 LAB — CBG MONITORING, ED: Glucose-Capillary: 155 mg/dL — ABNORMAL HIGH (ref 70–99)

## 2021-05-27 LAB — LIPASE, BLOOD: Lipase: 22 U/L (ref 11–51)

## 2021-05-27 LAB — BRAIN NATRIURETIC PEPTIDE: B Natriuretic Peptide: 719.8 pg/mL — ABNORMAL HIGH (ref 0.0–100.0)

## 2021-05-27 LAB — TROPONIN I (HIGH SENSITIVITY): Troponin I (High Sensitivity): 10 ng/L (ref <18)

## 2021-05-27 MED ORDER — FUROSEMIDE 10 MG/ML IJ SOLN
20.0000 mg | Freq: Once | INTRAMUSCULAR | Status: AC
  Administered 2021-05-27: 20 mg via INTRAVENOUS
  Filled 2021-05-27: qty 2

## 2021-05-27 MED ORDER — FUROSEMIDE 20 MG PO TABS: 20.0000 mg | ORAL_TABLET | Freq: Every day | ORAL | 0 refills | Status: DC

## 2021-05-27 NOTE — ED Provider Notes (Signed)
Emergency Medicine Provider Triage Evaluation Note  Daisy Pearson , a 87 y.o. female  was evaluated in triage.  Pt complains of right flank pain, worse with palpation as well as lower extremity swelling.  Had abdominal pain, took bisacodyl and symptoms improved.  Feeling slightly short of breath, history of A. fib, told she needed cardioversion, currently on Xarelto.  Review of Systems  Positive: Right flank pain, lower extremity edema Negative: Chest pain, nausea, vomiting  Physical Exam  BP (!) 145/43 (BP Location: Right Arm)   Pulse 62   Temp 98.4 F (36.9 C) (Oral)   Resp 20   SpO2 100%  Gen:   Awake, no distress   Resp:  Normal effort  MSK:   Moves extremities without difficulty  Other:  Abdomen is soft and nontender, does have tenderness palpation right upper lumbar paraspinous, skin normal  Medical Decision Making  Medically screening exam initiated at 11:02 AM.  Appropriate orders placed.  Harlon Flor was informed that the remainder of the evaluation will be completed by another provider, this initial triage assessment does not replace that evaluation, and the importance of remaining in the ED until their evaluation is complete.     Tacy Learn, PA-C 05/27/21 1103    Truddie Hidden, MD 05/27/21 [phone]

## 2021-05-27 NOTE — ED Triage Notes (Signed)
Pt. Stated, Ive had swelling in my ankles and stomach pain for 2 days.

## 2021-05-27 NOTE — Discharge Instructions (Addendum)
It was a pleasure taking care of you today. As discussed, I suspect your symptoms are related to heart failure. You were given diuretics here in the ER and I am sending you home with 2 days worth. Take daily. Please follow-up with cardiologist within the next few days for further evaluation. Return to the ER for new or worsening symptoms.

## 2021-05-27 NOTE — ED Notes (Signed)
Patient ambulated well with walker. No shortness of breath or dizziness reported. O2 saturation stayed between 91-95% throughout on room air.

## 2021-05-27 NOTE — ED Provider Notes (Signed)
[place] EMERGENCY DEPARTMENT Provider Note   CSN: [id number] Arrival date & time: 05/27/21  1002     History Chief Complaint  Patient presents with   Abdominal Pain   Leg Swelling    Daisy Pearson is a 87 y.o. female with a past medical history significant for CKD stage IV, CAD, fibromyalgia, hyperlipidemia, A. fib on chronic Xarelto, history of CVA, and type 2 diabetes who presents to the ED due to lower extremity edema associated with abdominal distention x2 days.  Patient also endorses worsening shortness of breath.  Has shortness of breath at baseline however, has worsened over the past 2 days.  No cough, fever, or chills.  No history of heart failure.  She is not currently on any diuretics.  Patient is on Xarelto chronically which she has been compliant with with no missed doses.  Denies associated chest pain.  Shortness of breath worse with exertion.  No treatment prior to arrival.    History obtained from patient, daughter at bedside, and past medical records. No interpreter used during encounter.       Past Medical History:  Diagnosis Date   Acute pancreatitis 02/14/2017   Archie Endo 02/14/2017   Arthritis    "knees, skeletal joints" (02/19/2017)   Chronic lower back pain    CKD (chronic kidney disease), stage IV ([place])    hx/notes 02/14/2017   Coronary artery disease    Fibromyalgia    Hyperlipidemia    hx/notes 02/14/2017   Hypertension    PAF (paroxysmal atrial fibrillation) ([place])    hx/notes 02/14/2017   Presence of permanent cardiac pacemaker    Sleep apnea    "wore mask til ~ 18 months ago; not a problem now" (02/19/2017)   Stroke [facility]) 2010   "got TPA: completely resolved" (02/19/2017)   Type II diabetes mellitus ([place])    hx/notes 02/14/2017    Patient Active Problem List   Diagnosis Date Noted   Acute encephalopathy 08/18/2019   Acute respiratory failure due to COVID-19 [facility]) 08/18/2019   ARF (acute renal failure) ([place]) 08/18/2019    CAP (community acquired pneumonia) 08/17/2019   Type 2 diabetes mellitus ([place]) 02/16/2017   Acute kidney injury ([place]) 02/16/2017   Essential hypertension 02/16/2017   A-fib ([place]) 02/16/2017   Acute gallstone pancreatitis 02/14/2017    Past Surgical History:  Procedure Laterality Date   ADRENALECTOMY  2006   partial; pheoochromocytoma removed   AORTIC VALVE REPLACEMENT (AVR)/CORONARY ARTERY BYPASS GRAFTING (CABG)     BACK SURGERY     BREAST SURGERY     "had some calcified tissue removed"   CARDIAC CATHETERIZATION  2014   CARDIAC VALVE REPLACEMENT     CHOLECYSTECTOMY N/A 02/22/2017   Procedure: LAPAROSCOPIC CHOLECYSTECTOMY WITH INTRAOPERATIVE CHOLANGIOGRAM;  Surgeon: Ralene Ok, MD;  Location: [place];  Service: General;  Laterality: N/A;   CORONARY ARTERY BYPASS GRAFT  2014   CABG X2   ERCP  02/19/2017   ERCP N/A 02/19/2017   Procedure: ENDOSCOPIC RETROGRADE CHOLANGIOPANCREATOGRAPHY (ERCP);  Surgeon: Clarene Essex, MD;  Location: [place];  Service: Endoscopy;  Laterality: N/A;   FIXATION KYPHOPLASTY LUMBAR SPINE     INSERT / REPLACE / REMOVE PACEMAKER  04/05/2013   [employer] Serial# [id number] Lemmie Evens [id number], SERIAL #[id number]   KNEE ARTHROSCOPY Right    "torn meniscus"   PARTIAL THYMECTOMY  1964     OB History   No obstetric history on file.     Family History  Problem Relation Age of Onset  Hypertension Father     Social History   Tobacco Use   Smoking status: Never   Smokeless tobacco: Never  Vaping Use   Vaping Use: Never used  Substance Use Topics   Alcohol use: No   Drug use: No    Home Medications Prior to Admission medications   Medication Sig Start Date End Date Taking? Authorizing Provider  acetaminophen (TYLENOL) 325 MG tablet Take 650 mg by mouth every 6 (six) hours as needed for headache (pain).    [provider]  alendronate (FOSAMAX) 70 MG tablet Take 70 mg by mouth every Saturday. Take with a full glass of water on an empty  stomach.    [provider]  amiodarone (PACERONE) 200 MG tablet Take 200 mg by mouth daily.    [provider]  amLODipine (NORVASC) 5 MG tablet Take 1 tablet (5 mg total) by mouth daily. 02/24/17   Ina Homes, MD  atorvastatin (LIPITOR) 40 MG tablet Take 40 mg by mouth daily.    [provider]  Buprenorphine HCl (BELBUCA) 300 MCG FILM Place 1 Film inside cheek 2 (two) times daily.  03/20/19   [provider]  calcitRIOL (ROCALTROL) 0.5 MCG capsule Take 0.5 mcg by mouth daily.    [provider]  cetirizine (ZYRTEC) 10 MG tablet Take 10 mg by mouth daily as needed for allergies.    [provider]  Cholecalciferol (VITAMIN D) 2000 units tablet Take 4,000 Units by mouth daily.     [provider]  dabigatran (PRADAXA) 75 MG CAPS capsule Take 75 mg by mouth 2 (two) times daily.  10/12/18   [provider]  diclofenac Sodium (VOLTAREN) 1 % GEL Apply 2 g topically 2 (two) times daily as needed (joint pain).    [provider]  enalapril (VASOTEC) 2.5 MG tablet Take 2.5 mg by mouth daily. 07/14/19   [provider]  EPINEPHrine (EPIPEN 2-PAK) 0.3 mg/0.3 mL IJ SOAJ injection Inject 0.3 mg into the muscle once as needed (severe allergic reaction).    [provider]  furosemide (LASIX) 20 MG tablet Take 1 tablet (20 mg total) by mouth daily for 2 days. 05/27/21 05/29/21 Yes Sylver Vantassell, Druscilla Brownie, PA-C  Insulin Detemir (LEVEMIR FLEXTOUCH) 100 UNIT/ML Pen Inject 18 Units into the skin 2 (two) times daily.    [provider]  Insulin Glulisine (APIDRA SOLOSTAR) 100 UNIT/ML Solostar Pen Inject 12 Units into the skin See admin instructions. Inject 12 units subcutaneously three times daily before meals - plus adjustment if CBG >200 - 1 unit for every 20    [provider]  ipratropium (ATROVENT) 0.06 % nasal spray Place 2 sprays into both nostrils 4 (four) times daily as needed. Affected nostril  08/02/19   [provider]  metaxalone (SKELAXIN) 800 MG tablet Take 800 mg by mouth 3 (three) times daily as needed for muscle spasms.    [provider]  Methylfol-Methylcob-Acetylcyst (CEREFOLIN NAC) 6-2-600 MG TABS Take 1 tablet by mouth daily.    [provider]  metoprolol tartrate (LOPRESSOR) 25 MG tablet Take 25 mg by mouth 2 (two) times daily.    [provider]  mirtazapine (REMERON) 15 MG tablet Take 15 mg by mouth at bedtime.    [provider]  nitroGLYCERIN (NITROSTAT) 0.4 MG SL tablet Place 0.4 mg under the tongue every 5 (five) minutes as needed for chest pain.    [provider]  Omega-3 Fatty Acids (OMEGA 3 PO) Take  1 capsule by mouth daily.    [provider]  oxyCODONE-acetaminophen (PERCOCET/ROXICET) 5-325 MG tablet Take 1 tablet by mouth 2 (two) times daily as needed for severe pain.    [provider]  QUEtiapine (SEROQUEL) 25 MG tablet Take 25 mg by mouth at bedtime.    [provider]    Allergies    Esomeprazole sodium, Penicillins, Clindamycin/lincomycin, Hydrocodone, Nsaids, Valsartan, Hydrocodone-acetaminophen, and Prilosec [omeprazole]  Review of Systems   Review of Systems  Constitutional:  Negative for chills and fever.  Respiratory:  Positive for shortness of breath. Negative for cough.   Cardiovascular:  Positive for leg swelling. Negative for chest pain.  Gastrointestinal:  Positive for abdominal distention. Negative for diarrhea, nausea and vomiting.  All other systems reviewed and are negative.  Physical Exam Updated Vital Signs BP (!) 165/69   Pulse 62   Temp 97.9 F (36.6 C)   Resp 15   SpO2 100%   Physical Exam Vitals and nursing note reviewed.  Constitutional:      General: She is not in acute distress.    Appearance: She is not ill-appearing.  HENT:     Head: Normocephalic.  Eyes:     Pupils: Pupils are equal, round, and reactive to light.  Cardiovascular:      Rate and Rhythm: Normal rate and regular rhythm.     Pulses: Normal pulses.     Heart sounds: Normal heart sounds. No murmur heard.   No friction rub. No gallop.  Pulmonary:     Effort: Pulmonary effort is normal.     Breath sounds: Normal breath sounds.  Abdominal:     General: Abdomen is flat. There is no distension.     Palpations: Abdomen is soft.     Tenderness: There is no abdominal tenderness. There is no guarding or rebound.  Musculoskeletal:        General: Normal range of motion.     Cervical back: Neck supple.     Comments: 2+ pitting edema bilaterally.   Skin:    General: Skin is warm and dry.  Neurological:     General: No focal deficit present.     Mental Status: She is alert.  Psychiatric:        Mood and Affect: Mood normal.        Behavior: Behavior normal.    ED Results / Procedures / Treatments   Labs (all labs ordered are listed, but only abnormal results are displayed) Labs Reviewed  CBC WITH DIFFERENTIAL/PLATELET - Abnormal; Notable for the following components:      Result Value   Hemoglobin 11.8 (*)    All other components within normal limits  COMPREHENSIVE METABOLIC PANEL - Abnormal; Notable for the following components:   Glucose, Bld 140 (*)    Creatinine, Ser 1.55 (*)    Total Protein 6.2 (*)    GFR, Estimated 33 (*)    All other components within normal limits  URINALYSIS, ROUTINE W REFLEX MICROSCOPIC - Abnormal; Notable for the following components:   APPearance HAZY (*)    Hgb urine dipstick SMALL (*)    All other components within normal limits  BRAIN NATRIURETIC PEPTIDE - Abnormal; Notable for the following components:   B Natriuretic Peptide 719.8 (*)    All other components within normal limits  CBG MONITORING, ED - Abnormal; Notable for the following components:   Glucose-Capillary 155 (*)    All other components within normal limits  LIPASE, BLOOD  TROPONIN I (  HIGH SENSITIVITY)    EKG EKG  Interpretation  Date/Time:  Tuesday May 27 2021 10:43:46 EDT Ventricular Rate:  64 PR Interval:    QRS Duration: 142 QT Interval:  528 QTC Calculation: 544 R Axis:   -86 Text Interpretation: Ventricular-paced rhythm Abnormal ECG Confirmed by Dene Gentry [phone]) on 05/27/2021 8:22:24 PM  Radiology DG Chest 2 View  Result Date: 05/27/2021 CLINICAL DATA:  Dyspnea EXAM: CHEST - 2 VIEW COMPARISON:  08/17/2019 chest radiograph. FINDINGS: Intact sternotomy wires. Stable configuration of 2 lead right subclavian pacemaker. Stable cardiomediastinal silhouette with mild cardiomegaly. No pneumothorax. No pleural effusion. No overt pulmonary edema. No acute consolidative airspace disease. Vertebroplasty material within chronic severe L1 vertebral compression fracture. IMPRESSION: Stable mild cardiomegaly without overt pulmonary edema. No active pulmonary disease. Electronically Signed   By: Ilona Sorrel M.D.   On: 05/27/2021 11:52   DG Lumbar Spine Complete  Result Date: 05/27/2021 CLINICAL DATA:  Dyspnea. Worsening lower and upper back pain. No acute injury. EXAM: LUMBAR SPINE - COMPLETE 4+ VIEW COMPARISON:  06/13/2015 MRI lumbar spine FINDINGS: This report assumes 5 non rib-bearing lumbar vertebrae. Chronic severe L1 vertebral compression fracture status post vertebroplasty. Otherwise preserved lumbar vertebral body heights with no acute lumbar spine fracture. Moderate multilevel lumbar degenerative disc disease, most prominent at L1-2 and L5-S1. No spondylolisthesis. No significant facet arthropathy. No aggressive appearing focal osseous lesions. Abdominal aortic atherosclerosis. Surgical clips in the medial upper left abdomen. IMPRESSION: 1. No acute lumbar spine fracture. 2. Chronic severe L1 vertebral compression fracture status post vertebroplasty. 3. Moderate multilevel lumbar degenerative disc disease, most prominent at L1-2 and L5-S1. Electronically Signed   By: Ilona Sorrel M.D.   On:  05/27/2021 11:55    Procedures Procedures   Medications Ordered in ED Medications  furosemide (LASIX) injection 20 mg (20 mg Intravenous Given 05/27/21 1847)    ED Course  I have reviewed the triage vital signs and the nursing notes.  Pertinent labs & imaging results that were available during my care of the patient were reviewed by me and considered in my medical decision making (see chart for details).    MDM Rules/Calculators/A&P                          87 year old female presents to the ED due to lower extremity edema and worsening shortness of breath x2 days.  No history of heart failure.  Lower extremity edema associated with abdominal distention.  Upon arrival, stable vitals.  Patient is afebrile, not tachycardic or hypoxic.  Patient in no acute distress.  Unfortunately, patient waited over 8 hours prior to my initial evaluation due to long wait times.  Physical exam significant for 2+ pitting edema bilaterally.  Abdomen soft, nontender, with questionable distention? Given no tenderness, low suspicion for acute abdomen. Labs ordered at triage.   CBC significant for mild anemia with hemoglobin 11.8.  No leukocytosis.  CMP significant for hyperglycemia 140.  Creatinine 1.55 likely due to history of CKD. UA negative for signs of infection. BNP elevated at 719. Suspect symptoms related to undiagnosed CHF. IV lasix given.  Chest x-ray personally reviewed which demonstrates mild stable cardiomegaly.  No other acute abnormalities. Patient ambulated in the ED and maintain O2 saturation above 90% without difficulty. Low suspicion for PE/DVT given patient has been compliant with her Xarelto. Suspect symptoms related to CHF. Patient endorses improvement in shortness of breath after IV lasix.  Offered admission for CHF exacerbation; however  patient and daughter would prefer patient to be discharged with close cardiology follow-up which I find to be reasonable given no episodes of hypoxia during her  11 hour ED stay.  Advised patient to follow-up with cardiologist within the next few days.  Patient discharged with 2 days of Lasix however, will defer further CHF treatment to her cardiology given CKD. Strict ED precautions discussed with patient. Patient states understanding and agrees to plan. Patient discharged home in no acute distress and stable vitals  Discussed case with Dr. Francia Greaves who evaluated patient at bedside and agrees with assessment and plan.    Final Clinical Impression(s) / ED Diagnoses Final diagnoses:  Peripheral edema    Rx / DC Orders ED Discharge Orders          Ordered    furosemide (LASIX) 20 MG tablet  Daily        05/27/21 2047             Karie Kirks 05/27/21 2052    Valarie Merino, MD 05/27/21 2302

## 2021-08-16 ENCOUNTER — Emergency Department (HOSPITAL_BASED_OUTPATIENT_CLINIC_OR_DEPARTMENT_OTHER): Payer: Medicare Other

## 2021-08-16 ENCOUNTER — Other Ambulatory Visit: Payer: Self-pay

## 2021-08-16 ENCOUNTER — Encounter (HOSPITAL_BASED_OUTPATIENT_CLINIC_OR_DEPARTMENT_OTHER): Payer: Self-pay

## 2021-08-16 ENCOUNTER — Emergency Department (HOSPITAL_BASED_OUTPATIENT_CLINIC_OR_DEPARTMENT_OTHER)
Admission: EM | Admit: 2021-08-16 | Discharge: 2021-08-16 | Disposition: A | Discharge status: Home / Self Care | Payer: Medicare Other | Attending: Emergency Medicine | Admitting: Emergency Medicine

## 2021-08-16 DIAGNOSIS — Z794 Long term (current) use of insulin: Secondary | ICD-10-CM | POA: Diagnosis not present

## 2021-08-16 DIAGNOSIS — R519 Headache, unspecified: Secondary | ICD-10-CM | POA: Diagnosis not present

## 2021-08-16 DIAGNOSIS — I129 Hypertensive chronic kidney disease with stage 1 through stage 4 chronic kidney disease, or unspecified chronic kidney disease: Secondary | ICD-10-CM | POA: Diagnosis not present

## 2021-08-16 DIAGNOSIS — E1122 Type 2 diabetes mellitus with diabetic chronic kidney disease: Secondary | ICD-10-CM | POA: Insufficient documentation

## 2021-08-16 DIAGNOSIS — I2510 Atherosclerotic heart disease of native coronary artery without angina pectoris: Secondary | ICD-10-CM | POA: Insufficient documentation

## 2021-08-16 DIAGNOSIS — H1132 Conjunctival hemorrhage, left eye: Secondary | ICD-10-CM | POA: Insufficient documentation

## 2021-08-16 DIAGNOSIS — N1832 Chronic kidney disease, stage 3b: Secondary | ICD-10-CM | POA: Insufficient documentation

## 2021-08-16 DIAGNOSIS — H53142 Visual discomfort, left eye: Secondary | ICD-10-CM | POA: Diagnosis present

## 2021-08-16 DIAGNOSIS — Z79899 Other long term (current) drug therapy: Secondary | ICD-10-CM | POA: Diagnosis not present

## 2021-08-16 DIAGNOSIS — R791 Abnormal coagulation profile: Secondary | ICD-10-CM

## 2021-08-16 LAB — BASIC METABOLIC PANEL
Anion gap: 10 (ref 5–15)
BUN: 24 mg/dL — ABNORMAL HIGH (ref 8–23)
CO2: 25 mmol/L (ref 22–32)
Calcium: 9.7 mg/dL (ref 8.9–10.3)
Chloride: 106 mmol/L (ref 98–111)
Creatinine, Ser: 1.65 mg/dL — ABNORMAL HIGH (ref 0.44–1.00)
GFR, Estimated: 30 mL/min — ABNORMAL LOW (ref >60)
Glucose, Bld: 98 mg/dL (ref 70–99)
Potassium: 4.4 mmol/L (ref 3.5–5.1)
Sodium: 141 mmol/L (ref 135–145)

## 2021-08-16 LAB — CBC WITH DIFFERENTIAL/PLATELET
Abs Immature Granulocytes: 0.02 10*3/uL (ref 0.00–0.07)
Basophils Absolute: 0 10*3/uL (ref 0.0–0.1)
Basophils Relative: 0 %
Eosinophils Absolute: 0.1 10*3/uL (ref 0.0–0.5)
Eosinophils Relative: 2 %
HCT: 37.8 % (ref 36.0–46.0)
Hemoglobin: 12.1 g/dL (ref 12.0–15.0)
Immature Granulocytes: 0 %
Lymphocytes Relative: 25 %
Lymphs Abs: 1.9 10*3/uL (ref 0.7–4.0)
MCH: 28.3 pg (ref 26.0–34.0)
MCHC: 32 g/dL (ref 30.0–36.0)
MCV: 88.3 fL (ref 80.0–100.0)
Monocytes Absolute: 0.8 10*3/uL (ref 0.1–1.0)
Monocytes Relative: 10 %
Neutro Abs: 4.8 10*3/uL (ref 1.7–7.7)
Neutrophils Relative %: 63 %
Platelets: 216 10*3/uL (ref 150–400)
RBC: 4.28 MIL/uL (ref 3.87–5.11)
RDW: 15 % (ref 11.5–15.5)
WBC: 7.6 10*3/uL (ref 4.0–10.5)
nRBC: 0 % (ref 0.0–0.2)

## 2021-08-16 LAB — PROTIME-INR
INR: 3.1 — ABNORMAL HIGH (ref 0.8–1.2)
Prothrombin Time: 31.5 seconds — ABNORMAL HIGH (ref 11.4–15.2)

## 2021-08-16 MED ORDER — TETRACAINE HCL 0.5 % OP SOLN
2.0000 [drp] | Freq: Once | OPHTHALMIC | Status: AC
Start: 2021-08-16 — End: 2021-08-16
  Administered 2021-08-16: 2 [drp] via OPHTHALMIC
  Filled 2021-08-16: qty 4

## 2021-08-16 MED ORDER — FLUORESCEIN SODIUM 1 MG OP STRP
1.0000 | ORAL_STRIP | Freq: Once | OPHTHALMIC | Status: AC
  Administered 2021-08-16: 1 via OPHTHALMIC
  Filled 2021-08-16: qty 1

## 2021-08-16 NOTE — ED Provider Notes (Signed)
[place] EMERGENCY DEPARTMENT Provider Note   CSN: [id number] Arrival date & time: 08/16/21  1114     History  Chief Complaint  Patient presents with   Eye Problem    Daisy Pearson is a 88 y.o. female.  Pt is a 88 yo wf with a hx of htn, afib on Pradaxa, hyperlipidemia, cad, dm, cva, ckd, and arthritis.  Her daughter woke pt up this morning to give her the morning meds and saw that pt's left eye was red.  Pt denies any pain.  She can feel a little discomfort.  No visual disturbances.  She has some tenderness under her left eye.  She denies trauma.        Home Medications Prior to Admission medications   Medication Sig Start Date End Date Taking? Authorizing Provider  acetaminophen (TYLENOL) 325 MG tablet Take 650 mg by mouth every 6 (six) hours as needed for headache (pain).    [provider]  alendronate (FOSAMAX) 70 MG tablet Take 70 mg by mouth every Saturday. Take with a full glass of water on an empty stomach.    [provider]  amiodarone (PACERONE) 200 MG tablet Take 200 mg by mouth daily.    [provider]  amLODipine (NORVASC) 5 MG tablet Take 1 tablet (5 mg total) by mouth daily. 02/24/17   Ina Homes, MD  atorvastatin (LIPITOR) 40 MG tablet Take 40 mg by mouth daily.    [provider]  Buprenorphine HCl (BELBUCA) 300 MCG FILM Place 1 Film inside cheek 2 (two) times daily.  03/20/19   [provider]  calcitRIOL (ROCALTROL) 0.5 MCG capsule Take 0.5 mcg by mouth daily.    [provider]  cetirizine (ZYRTEC) 10 MG tablet Take 10 mg by mouth daily as needed for allergies.    [provider]  Cholecalciferol (VITAMIN D) 2000 units tablet Take 4,000 Units by mouth daily.     [provider]  dabigatran (PRADAXA) 75 MG CAPS capsule Take 75 mg by mouth 2 (two) times daily.  10/12/18   [provider]  diclofenac Sodium (VOLTAREN) 1 % GEL Apply 2 g topically 2 (two) times  daily as needed (joint pain).    [provider]  enalapril (VASOTEC) 2.5 MG tablet Take 2.5 mg by mouth daily. 07/14/19   [provider]  EPINEPHrine (EPIPEN 2-PAK) 0.3 mg/0.3 mL IJ SOAJ injection Inject 0.3 mg into the muscle once as needed (severe allergic reaction).    [provider]  furosemide (LASIX) 20 MG tablet Take 1 tablet (20 mg total) by mouth daily for 2 days. 05/27/21 05/29/21  Suzy Bouchard, PA-C  Insulin Detemir (LEVEMIR FLEXTOUCH) 100 UNIT/ML Pen Inject 18 Units into the skin 2 (two) times daily.    [provider]  Insulin Glulisine (APIDRA SOLOSTAR) 100 UNIT/ML Solostar Pen Inject 12 Units into the skin See admin instructions. Inject 12 units subcutaneously three times daily before meals - plus adjustment if CBG >200 - 1 unit for every 20    [provider]  ipratropium (ATROVENT) 0.06 % nasal spray Place 2 sprays into both nostrils 4 (four) times daily as needed. Affected nostril 08/02/19   [provider]  metaxalone (SKELAXIN) 800 MG tablet Take 800 mg by mouth 3 (three) times daily as needed for muscle spasms.    [provider]  Methylfol-Methylcob-Acetylcyst (CEREFOLIN NAC) 6-2-600 MG TABS Take 1 tablet by mouth daily.    [provider]  metoprolol tartrate (LOPRESSOR) 25 MG tablet Take 25 mg by mouth 2 (two) times daily.    [provider]  mirtazapine (REMERON) 15 MG tablet Take 15 mg by mouth at bedtime.    [provider]  nitroGLYCERIN (NITROSTAT) 0.4 MG SL tablet Place 0.4 mg under the tongue every 5 (five) minutes as needed for chest pain.    [provider]  Omega-3 Fatty Acids (OMEGA 3 PO) Take 1 capsule by mouth daily.    [provider]  oxyCODONE-acetaminophen (PERCOCET/ROXICET) 5-325 MG tablet Take 1 tablet by mouth 2 (two) times daily as needed for severe pain.    [provider]  QUEtiapine (SEROQUEL) 25 MG tablet Take 25 mg by mouth at  bedtime.    [provider]      Allergies    Esomeprazole sodium, Penicillins, Clindamycin/lincomycin, Hydrocodone, Nsaids, Valsartan, Hydrocodone-acetaminophen, and Prilosec [omeprazole]    Review of Systems   Review of Systems  HENT:         Left eye red  All other systems reviewed and are negative.  Physical Exam Updated Vital Signs BP (!) 167/67 (BP Location: Left Arm)    Pulse 64    Temp 98.2 F (36.8 C) (Oral)    Resp 20    Ht 5\' 3"  (1.6 m)    Wt 91.6 kg    SpO2 100%    BMI 35.78 kg/m  Physical Exam Vitals and nursing note reviewed.  Constitutional:      Appearance: Normal appearance.  HENT:     Head: Normocephalic and atraumatic.     Right Ear: External ear normal.     Left Ear: External ear normal.     Nose: Nose normal.     Mouth/Throat:     Mouth: Mucous membranes are moist.     Pharynx: Oropharynx is clear.  Eyes:     Extraocular Movements: Extraocular movements intact.     Conjunctiva/sclera:     Left eye: Hemorrhage present.     Pupils: Pupils are equal, round, and reactive to light.     Left eye: No corneal abrasion or fluorescein uptake.  Cardiovascular:     Rate and Rhythm: Normal rate and regular rhythm.     Pulses: Normal pulses.     Heart sounds: Normal heart sounds.  Pulmonary:     Effort: Pulmonary effort is normal.     Breath sounds: Normal breath sounds.  Abdominal:     General: Abdomen is flat. Bowel sounds are normal.     Palpations: Abdomen is soft.  Musculoskeletal:        General: Normal range of motion.     Cervical back: Normal range of motion and neck supple.  Skin:    General: Skin is warm.     Capillary Refill: Capillary refill takes less than 2 seconds.  Neurological:     General: No focal deficit present.     Mental Status: She is alert and oriented to person, place, and time.  Psychiatric:        Mood and Affect: Mood normal.        Behavior: Behavior normal.    ED Results / Procedures / Treatments   Labs (all  labs ordered are listed, but only abnormal results are displayed) Labs Reviewed  BASIC METABOLIC PANEL - Abnormal; Notable for the following components:      Result Value   BUN 24 (*)    Creatinine, Ser 1.65 (*)    GFR, Estimated 30 (*)  All other components within normal limits  PROTIME-INR - Abnormal; Notable for the following components:   Prothrombin Time 31.5 (*)    INR 3.1 (*)    All other components within normal limits  CBC WITH DIFFERENTIAL/PLATELET    EKG None  Radiology CT Head Wo Contrast  Result Date: 08/16/2021 CLINICAL DATA:  88 year old female who woke with headache, redness of left sclera. On blood thinners. EXAM: CT HEAD WITHOUT CONTRAST TECHNIQUE: Contiguous axial images were obtained from the base of the skull through the vertex without intravenous contrast. RADIATION DOSE REDUCTION: This exam was performed according to the departmental dose-optimization program which includes automated exposure control, adjustment of the mA and/or kV according to patient size and/or use of iterative reconstruction technique. COMPARISON:  Brain MRI 06/13/2015. Head CT 08/17/2019. FINDINGS: Brain: Stable cerebral volume from last year. Confluent bilateral cerebral white matter hypodensity appears stable, relatively sparing the deep gray nuclei. No midline shift, ventriculomegaly, mass effect, evidence of mass lesion, intracranial hemorrhage or evidence of cortically based acute infarction. Vascular: Calcified atherosclerosis at the skull base. No suspicious intracranial vascular hyperdensity. Skull: No acute osseous abnormality identified. Sinuses/Orbits: Visualized paranasal sinuses and mastoids are stable and well aerated. Other: Postoperative changes to both globes. Dedicated Orbit CT is reported separately today. Visualized scalp soft tissues are within normal limits. IMPRESSION: 1. No acute intracranial abnormality. Stable non contrast CT appearance of the brain since last year. 2. See  also Orbit CT today reported separately. Electronically Signed   By: Genevie Ann M.D.   On: 08/16/2021 12:38   CT Orbits Wo Contrast  Result Date: 08/16/2021 CLINICAL DATA:  88 year old female who woke with headache, redness of left sclera. On blood thinners. EXAM: CT ORBITS WITHOUT CONTRAST TECHNIQUE: Multidetector CT imaging of the orbits was performed using the standard protocol without intravenous contrast. Multiplanar CT image reconstructions were also generated. RADIATION DOSE REDUCTION: This exam was performed according to the departmental dose-optimization program which includes automated exposure control, adjustment of the mA and/or kV according to patient size and/or use of iterative reconstruction technique. COMPARISON:  Head CT today reported separately. Brain MRI 06/13/2015. FINDINGS: Orbits: Chronic postoperative changes to both globes. Symmetric and normal noncontrast appearance of the intraorbital soft tissues. No superior ophthalmic vein enlargement. No intraorbital mass or inflammation identified. Visible paranasal sinuses: Clear throughout. Soft tissues: Negative visible noncontrast deep soft tissue spaces of the face. Visualized scalp soft tissues are within normal limits. Osseous: No acute osseous abnormality identified. Limited intracranial: Calcified atherosclerosis at the skull base. Negative noncontrast appearance of the cavernous sinus. Brain detailed separately today. IMPRESSION: Negative noncontrast CT appearance of the Orbits. Electronically Signed   By: Genevie Ann M.D.   On: 08/16/2021 12:40    Procedures Procedures    Medications Ordered in ED Medications  fluorescein ophthalmic strip 1 strip (has no administration in time range)  tetracaine (PONTOCAINE) 0.5 % ophthalmic solution 2 drop (2 drops Left Eye Given 08/16/21 1234)    ED Course/ Medical Decision Making/ A&P                           Medical Decision Making Amount and/or Complexity of Data Reviewed Labs:  ordered. Radiology: ordered.  Risk Prescription drug management.   INR is 3.1.  Pt told to hold the Pradaxa for 2 days.  This elevation is likely the cause for the hemorrhage.  Pt's ct head/orbits are unremarkable for anything acute.    Pt's labs  are reviewed and show nothing else acute.  She has ckd stage 3b.  CBC is nl.  Pt has an eye doctor.  She is to call on Monday and make an appt.  Plan reviewed with pt and family in the room.  Pt is stable for d/c.  Return if worse.          Final Clinical Impression(s) / ED Diagnoses Final diagnoses:  Subconjunctival hemorrhage of left eye  Elevated INR  Stage 3b chronic kidney disease ([address])    Rx / DC Orders ED Discharge Orders     None         Isla Pence, MD 08/16/21 1312

## 2021-08-16 NOTE — Discharge Instructions (Addendum)
Hold your Pradaxa for 2 days.

## 2021-08-16 NOTE — ED Notes (Signed)
D/c paperwork reviewed with pt and pts family at bedside. No questions or concerns at time of d/c. Pt assisted to personal wheelchair by family.  Pt wheeled to ED exit, NAD.

## 2021-08-16 NOTE — ED Triage Notes (Signed)
Pt woke up with redness to the sclera of her left eye. Pt reports she is on pradaxa. Reports irritation and slight discomfort to left eye. Denies any vision changes or any other symptoms.

## 2021-09-27 ENCOUNTER — Emergency Department (HOSPITAL_BASED_OUTPATIENT_CLINIC_OR_DEPARTMENT_OTHER): Payer: Medicare Other

## 2021-09-27 ENCOUNTER — Emergency Department (HOSPITAL_BASED_OUTPATIENT_CLINIC_OR_DEPARTMENT_OTHER)
Admission: EM | Admit: 2021-09-27 | Discharge: 2021-09-27 | Disposition: A | Discharge status: Home / Self Care | Payer: Medicare Other | Attending: Emergency Medicine | Admitting: Emergency Medicine

## 2021-09-27 ENCOUNTER — Other Ambulatory Visit: Payer: Self-pay

## 2021-09-27 ENCOUNTER — Encounter (HOSPITAL_BASED_OUTPATIENT_CLINIC_OR_DEPARTMENT_OTHER): Payer: Self-pay

## 2021-09-27 DIAGNOSIS — N184 Chronic kidney disease, stage 4 (severe): Secondary | ICD-10-CM | POA: Insufficient documentation

## 2021-09-27 DIAGNOSIS — R0609 Other forms of dyspnea: Secondary | ICD-10-CM | POA: Diagnosis not present

## 2021-09-27 DIAGNOSIS — Z20822 Contact with and (suspected) exposure to covid-19: Secondary | ICD-10-CM | POA: Diagnosis not present

## 2021-09-27 DIAGNOSIS — I2510 Atherosclerotic heart disease of native coronary artery without angina pectoris: Secondary | ICD-10-CM | POA: Insufficient documentation

## 2021-09-27 DIAGNOSIS — Z794 Long term (current) use of insulin: Secondary | ICD-10-CM | POA: Insufficient documentation

## 2021-09-27 DIAGNOSIS — E1122 Type 2 diabetes mellitus with diabetic chronic kidney disease: Secondary | ICD-10-CM | POA: Diagnosis not present

## 2021-09-27 DIAGNOSIS — R112 Nausea with vomiting, unspecified: Secondary | ICD-10-CM | POA: Insufficient documentation

## 2021-09-27 DIAGNOSIS — T887XXA Unspecified adverse effect of drug or medicament, initial encounter: Secondary | ICD-10-CM

## 2021-09-27 DIAGNOSIS — Z79899 Other long term (current) drug therapy: Secondary | ICD-10-CM | POA: Diagnosis not present

## 2021-09-27 DIAGNOSIS — I129 Hypertensive chronic kidney disease with stage 1 through stage 4 chronic kidney disease, or unspecified chronic kidney disease: Secondary | ICD-10-CM | POA: Diagnosis not present

## 2021-09-27 DIAGNOSIS — T370X5A Adverse effect of sulfonamides, initial encounter: Secondary | ICD-10-CM | POA: Insufficient documentation

## 2021-09-27 LAB — CBC WITH DIFFERENTIAL/PLATELET
Abs Immature Granulocytes: 0.02 10*3/uL (ref 0.00–0.07)
Basophils Absolute: 0 10*3/uL (ref 0.0–0.1)
Basophils Relative: 1 %
Eosinophils Absolute: 0.1 10*3/uL (ref 0.0–0.5)
Eosinophils Relative: 1 %
HCT: 37.6 % (ref 36.0–46.0)
Hemoglobin: 12 g/dL (ref 12.0–15.0)
Immature Granulocytes: 0 %
Lymphocytes Relative: 16 %
Lymphs Abs: 1.1 10*3/uL (ref 0.7–4.0)
MCH: 28.4 pg (ref 26.0–34.0)
MCHC: 31.9 g/dL (ref 30.0–36.0)
MCV: 88.9 fL (ref 80.0–100.0)
Monocytes Absolute: 0.9 10*3/uL (ref 0.1–1.0)
Monocytes Relative: 14 %
Neutro Abs: 4.8 10*3/uL (ref 1.7–7.7)
Neutrophils Relative %: 68 %
Platelets: 217 10*3/uL (ref 150–400)
RBC: 4.23 MIL/uL (ref 3.87–5.11)
RDW: 15.6 % — ABNORMAL HIGH (ref 11.5–15.5)
WBC: 7 10*3/uL (ref 4.0–10.5)
nRBC: 0 % (ref 0.0–0.2)

## 2021-09-27 LAB — RESP PANEL BY RT-PCR (FLU A&B, COVID) ARPGX2
Influenza A by PCR: NEGATIVE
Influenza B by PCR: NEGATIVE
SARS Coronavirus 2 by RT PCR: NEGATIVE

## 2021-09-27 LAB — COMPREHENSIVE METABOLIC PANEL
ALT: 24 U/L (ref 0–44)
AST: 33 U/L (ref 15–41)
Albumin: 3.7 g/dL (ref 3.5–5.0)
Alkaline Phosphatase: 71 U/L (ref 38–126)
Anion gap: 10 (ref 5–15)
BUN: 31 mg/dL — ABNORMAL HIGH (ref 8–23)
CO2: 21 mmol/L — ABNORMAL LOW (ref 22–32)
Calcium: 9.3 mg/dL (ref 8.9–10.3)
Chloride: 103 mmol/L (ref 98–111)
Creatinine, Ser: 2.53 mg/dL — ABNORMAL HIGH (ref 0.44–1.00)
GFR, Estimated: 18 mL/min — ABNORMAL LOW (ref >60)
Glucose, Bld: 68 mg/dL — ABNORMAL LOW (ref 70–99)
Potassium: 4.9 mmol/L (ref 3.5–5.1)
Sodium: 134 mmol/L — ABNORMAL LOW (ref 135–145)
Total Bilirubin: 0.5 mg/dL (ref 0.3–1.2)
Total Protein: 7 g/dL (ref 6.5–8.1)

## 2021-09-27 LAB — URINALYSIS, ROUTINE W REFLEX MICROSCOPIC
Glucose, UA: NEGATIVE mg/dL
Hgb urine dipstick: NEGATIVE
Ketones, ur: NEGATIVE mg/dL
Leukocytes,Ua: NEGATIVE
Nitrite: NEGATIVE
Protein, ur: NEGATIVE mg/dL
Specific Gravity, Urine: >=1.03 (ref 1.005–1.030)
pH: 5.5 (ref 5.0–8.0)

## 2021-09-27 LAB — LIPASE, BLOOD: Lipase: 22 U/L (ref 11–51)

## 2021-09-27 LAB — TROPONIN I (HIGH SENSITIVITY): Troponin I (High Sensitivity): 5 ng/L (ref <18)

## 2021-09-27 LAB — LACTIC ACID, PLASMA: Lactic Acid, Venous: 1.2 mmol/L (ref 0.5–1.9)

## 2021-09-27 MED ORDER — ONDANSETRON 4 MG PO TBDP: 4.0000 mg | ORAL_TABLET | Freq: Three times a day (TID) | ORAL | 0 refills | Status: AC | PRN

## 2021-09-27 MED ORDER — DOXYCYCLINE HYCLATE 100 MG PO CAPS: 100.0000 mg | ORAL_CAPSULE | Freq: Two times a day (BID) | ORAL | 0 refills | Status: DC

## 2021-09-27 NOTE — ED Triage Notes (Signed)
Pt arrives with daughter who reports patient was recently placed on bactrim for a wound on her foot. Was told if she started getting sick to bring her to the ED. Daughter also reports some SOB and being unsteady on her feet. Pt is in wheelchair from home, is able to ambulate without at home. CGM shows 88 at this time.  ?

## 2021-09-27 NOTE — ED Provider Notes (Signed)
[facility] EMERGENCY DEPARTMENT Provider Note   CSN: [id number] Arrival date & time: 09/27/21  1228     History  Chief Complaint  Patient presents with   Emesis    [name] is a 88 y.o. female.  HPI     88 year old female with a history of type 2 diabetes, CVA, sleep apnea, pacemaker in place, paroxysmal atrial fibrillation, hypertension, hyperlipidemia, coronary artery disease, CKD stage IV, who presents with concern for generalized weakness, vomiting, dyspnea after starting bactrim on Wednesday for a sore on her foot.    Started on bactrim on Wednesday for a sore on her right foot--initially was more red, had pus in side of it, now the sore is improved,redness better  Today, developed nausea and vomiting, vomited 2-3 times No diarrhea, no constipation, no abdominal pain really When vomiting hurts everywhere but otherwise no  Shortness of breath after vomiting this AM, when got up to walk into bedroom last night, dyspnea on exertion just starting today.   No chest pain, cough, fever   Past Medical History:  Diagnosis Date   Acute pancreatitis 02/14/2017   Archie Endo 02/14/2017   Arthritis    "knees, skeletal joints" (02/19/2017)   Chronic lower back pain    CKD (chronic kidney disease), stage IV ([place])    hx/notes 02/14/2017   Coronary artery disease    Fibromyalgia    Hyperlipidemia    hx/notes 02/14/2017   Hypertension    PAF (paroxysmal atrial fibrillation) ([place])    hx/notes 02/14/2017   Presence of permanent cardiac pacemaker    Sleep apnea    "wore mask til ~ 18 months ago; not a problem now" (02/19/2017)   Stroke [facility]) 2010   "got TPA: completely resolved" (02/19/2017)   Type II diabetes mellitus ([place])    hx/notes 02/14/2017     Home Medications Prior to Admission medications   Medication Sig Start Date End Date Taking? Authorizing Provider  doxycycline (VIBRAMYCIN) 100 MG capsule Take 1 capsule (100 mg total) by mouth 2 (two) times daily  for 5 days. 09/27/21 10/02/21 Yes Gareth Morgan, MD  ondansetron (ZOFRAN-ODT) 4 MG disintegrating tablet Take 1 tablet (4 mg total) by mouth every 8 (eight) hours as needed for nausea or vomiting. 09/27/21  Yes Gareth Morgan, MD  acetaminophen (TYLENOL) 325 MG tablet Take 650 mg by mouth every 6 (six) hours as needed for headache (pain).    [provider]  alendronate (FOSAMAX) 70 MG tablet Take 70 mg by mouth every Saturday. Take with a full glass of water on an empty stomach.    [provider]  amiodarone (PACERONE) 200 MG tablet Take 200 mg by mouth daily.    [provider]  amLODipine (NORVASC) 5 MG tablet Take 1 tablet (5 mg total) by mouth daily. 02/24/17   Ina Homes, MD  atorvastatin (LIPITOR) 40 MG tablet Take 40 mg by mouth daily.    [provider]  Buprenorphine HCl (BELBUCA) 300 MCG FILM Place 1 Film inside cheek 2 (two) times daily.  03/20/19   [provider]  calcitRIOL (ROCALTROL) 0.5 MCG capsule Take 0.5 mcg by mouth daily.    [provider]  cetirizine (ZYRTEC) 10 MG tablet Take 10 mg by mouth daily as needed for allergies.    [provider]  Cholecalciferol (VITAMIN D) 2000 units tablet Take 4,000 Units by mouth daily.     [provider]  dabigatran (PRADAXA) 75 MG CAPS capsule Take 75 mg by  mouth 2 (two) times daily.  10/12/18   [provider]  diclofenac Sodium (VOLTAREN) 1 % GEL Apply 2 g topically 2 (two) times daily as needed (joint pain).    [provider]  enalapril (VASOTEC) 2.5 MG tablet Take 2.5 mg by mouth daily. 07/14/19   [provider]  EPINEPHrine (EPIPEN 2-PAK) 0.3 mg/0.3 mL IJ SOAJ injection Inject 0.3 mg into the muscle once as needed (severe allergic reaction).    [provider]  furosemide (LASIX) 20 MG tablet Take 1 tablet (20 mg total) by mouth daily for 2 days. 05/27/21 05/29/21  Suzy Bouchard, PA-C  Insulin Detemir (LEVEMIR FLEXTOUCH)  100 UNIT/ML Pen Inject 18 Units into the skin 2 (two) times daily.    [provider]  Insulin Glulisine (APIDRA SOLOSTAR) 100 UNIT/ML Solostar Pen Inject 12 Units into the skin See admin instructions. Inject 12 units subcutaneously three times daily before meals - plus adjustment if CBG >200 - 1 unit for every 20    [provider]  ipratropium (ATROVENT) 0.06 % nasal spray Place 2 sprays into both nostrils 4 (four) times daily as needed. Affected nostril 08/02/19   [provider]  metaxalone (SKELAXIN) 800 MG tablet Take 800 mg by mouth 3 (three) times daily as needed for muscle spasms.    [provider]  Methylfol-Methylcob-Acetylcyst (CEREFOLIN NAC) 6-2-600 MG TABS Take 1 tablet by mouth daily.    [provider]  metoprolol tartrate (LOPRESSOR) 25 MG tablet Take 25 mg by mouth 2 (two) times daily.    [provider]  mirtazapine (REMERON) 15 MG tablet Take 15 mg by mouth at bedtime.    [provider]  nitroGLYCERIN (NITROSTAT) 0.4 MG SL tablet Place 0.4 mg under the tongue every 5 (five) minutes as needed for chest pain.    [provider]  Omega-3 Fatty Acids (OMEGA 3 PO) Take 1 capsule by mouth daily.    [provider]  oxyCODONE-acetaminophen (PERCOCET/ROXICET) 5-325 MG tablet Take 1 tablet by mouth 2 (two) times daily as needed for severe pain.    [provider]  QUEtiapine (SEROQUEL) 25 MG tablet Take 25 mg by mouth at bedtime.    [provider]      Allergies    Esomeprazole sodium, Penicillins, Bactrim [sulfamethoxazole-trimethoprim], Clindamycin/lincomycin, Hydrocodone, Nsaids, Valsartan, Hydrocodone-acetaminophen, and Prilosec [omeprazole]    Review of Systems   Review of Systems See above  Physical Exam Updated Vital Signs BP (!) 143/53 (BP Location: Right Arm)    Pulse 68    Temp 98 F (36.7 C) (Oral)    Resp 17    Ht 5\' 4"  (1.626 m)    Wt 89.8 kg    SpO2 99%    BMI 33.99  kg/m  Physical Exam Vitals and nursing note reviewed.  Constitutional:      General: She is not in acute distress.    Appearance: She is well-developed. She is not diaphoretic.  HENT:     Head: Normocephalic and atraumatic.  Eyes:     Conjunctiva/sclera: Conjunctivae normal.  Cardiovascular:     Rate and Rhythm: Normal rate and regular rhythm.     Heart sounds: Normal heart sounds. No murmur heard.   No friction rub. No gallop.  Pulmonary:     Effort: Pulmonary effort is normal. No respiratory distress.     Breath sounds: Normal breath sounds. No wheezing or rales.  Abdominal:     General: There is no distension.  Palpations: Abdomen is soft.     Tenderness: There is no abdominal tenderness. There is no guarding.  Musculoskeletal:        General: No tenderness.     Cervical back: Normal range of motion.  Skin:    General: Skin is warm and dry.     Findings: No erythema or rash.  Neurological:     Mental Status: She is alert and oriented to person, place, and time.    ED Results / Procedures / Treatments   Labs (all labs ordered are listed, but only abnormal results are displayed) Labs Reviewed  CBC WITH DIFFERENTIAL/PLATELET - Abnormal; Notable for the following components:      Result Value   RDW 15.6 (*)    All other components within normal limits  COMPREHENSIVE METABOLIC PANEL - Abnormal; Notable for the following components:   Sodium 134 (*)    CO2 21 (*)    Glucose, Bld 68 (*)    BUN 31 (*)    Creatinine, Ser 2.53 (*)    GFR, Estimated 18 (*)    All other components within normal limits  URINALYSIS, ROUTINE W REFLEX MICROSCOPIC - Abnormal; Notable for the following components:   APPearance HAZY (*)    Bilirubin Urine SMALL (*)    All other components within normal limits  RESP PANEL BY RT-PCR (FLU A&B, COVID) ARPGX2  URINE CULTURE  LIPASE, BLOOD  LACTIC ACID, PLASMA  TROPONIN I (HIGH SENSITIVITY)  TROPONIN I (HIGH SENSITIVITY)    EKG EKG  Interpretation  Date/Time:  Saturday September 27 2021 13:06:57 EST Ventricular Rate:  62 PR Interval:  398 QRS Duration: 86 QT Interval:  434 QTC Calculation: 440 R Axis:   51 Text Interpretation: Atrial-paced rhythm with prolonged AV conduction Nonspecific ST and T wave abnormality Abnormal ECG When compared with ECG of 27-May-2021 10:43,  No significant change since 14-Feb-2017 Confirmed by Gareth Morgan [phone]) on 09/27/2021 10:42:24 PM  Radiology DG Chest Portable 1 View  Result Date: 09/27/2021 CLINICAL DATA:  Shortness of breath EXAM: PORTABLE CHEST 1 VIEW COMPARISON:  Chest radiograph dated May 27, 2021 FINDINGS: Heart is enlarged. Evidence of prior CABG and right access pacemaker leads terminating in the right atrium and right ventricle. Atherosclerotic calcification of the aortic arch. Lungs are clear without evidence of focal consolidation or large pleural effusion. Mild bilateral acromioclavicular osteoarthritis. IMPRESSION: 1. Stable cardiomegaly with evidence of prior CABG with a pacemaker. 2. No acute cardiopulmonary process. Electronically Signed   By: Keane Police D.O.   On: 09/27/2021 13:35    Procedures Procedures    Medications Ordered in ED Medications - No data to display  ED Course/ Medical Decision Making/ A&P                           Medical Decision Making Amount and/or Complexity of Data Reviewed Labs: ordered. Radiology: ordered.  Risk Prescription drug management.    88 year old female with a history of type 2 diabetes, CVA, sleep apnea, pacemaker in place, paroxysmal atrial fibrillation, hypertension, hyperlipidemia, coronary artery disease, CKD stage IV, who presents with concern for generalized weakness, vomiting, dyspnea after starting bactrim on Wednesday for a sore on her foot.   DDx includes sepsis secondary to cellulitis/UTI/pneumonia or other, ACS, CHF, electrolyte abnormality, anemia, medication side effects.   COVID/influenza  negative.  Labs personally evaluated by me and shows no anemia, no leukocytosis, Cr increased to 2.53 from 1.65.  Glucose 68, increased  to 70s, sodium decreased to 134 from 141 in January. No sign of pancreatitis, troponin negative doubt ACS, UA without infection.CXR with cardiomegaly, no evidence of CHF, pneumonia, pneumothorax. No hypoxia, no tachypnea, low suspicion for PE by history.   Suspect side effect of bactrim causing nausea, vomiting. Dyspnea may be related to fatigue, generalized weakness. Normal oxygen saturation. Do not see indication for admission.  Discussed that recommend PCP follow up for continued evaluation of symptoms. Listed bactrim as allergy, recommend doxycycline for continued care of cellulitis. No sign of sepsis. Given rx for zofran. Patient discharged in stable condition with understanding of reasons to return.           Final Clinical Impression(s) / ED Diagnoses Final diagnoses:  Nausea and vomiting, unspecified vomiting type  Dyspnea on exertion  Side effect of medication    Rx / DC Orders ED Discharge Orders          Ordered    doxycycline (VIBRAMYCIN) 100 MG capsule  2 times daily        09/27/21 1452    ondansetron (ZOFRAN-ODT) 4 MG disintegrating tablet  Every 8 hours PRN        09/27/21 1452              Gareth Morgan, MD 09/27/21 [id number]

## 2021-09-27 NOTE — Discharge Instructions (Addendum)
It was a pleasure caring for you! Stop taking the bactrim, take the doxycycline and follow up with your doctor!  ?

## 2021-09-29 LAB — URINE CULTURE: Culture: <10000 — AB

## 2021-10-01 ENCOUNTER — Encounter (HOSPITAL_BASED_OUTPATIENT_CLINIC_OR_DEPARTMENT_OTHER): Payer: Self-pay

## 2021-10-01 ENCOUNTER — Emergency Department (HOSPITAL_BASED_OUTPATIENT_CLINIC_OR_DEPARTMENT_OTHER): Payer: Medicare Other

## 2021-10-01 ENCOUNTER — Other Ambulatory Visit: Payer: Self-pay

## 2021-10-01 ENCOUNTER — Inpatient Hospital Stay (HOSPITAL_BASED_OUTPATIENT_CLINIC_OR_DEPARTMENT_OTHER)
Admission: EM | Admit: 2021-10-01 | Discharge: 2021-10-03 | DRG: 193 | Disposition: A | Discharge status: Home / Self Care | Payer: Medicare Other | Attending: Internal Medicine | Admitting: Internal Medicine

## 2021-10-01 DIAGNOSIS — T368X5A Adverse effect of other systemic antibiotics, initial encounter: Secondary | ICD-10-CM | POA: Diagnosis present

## 2021-10-01 DIAGNOSIS — Z951 Presence of aortocoronary bypass graft: Secondary | ICD-10-CM | POA: Diagnosis not present

## 2021-10-01 DIAGNOSIS — Z79899 Other long term (current) drug therapy: Secondary | ICD-10-CM

## 2021-10-01 DIAGNOSIS — N1832 Chronic kidney disease, stage 3b: Secondary | ICD-10-CM | POA: Diagnosis not present

## 2021-10-01 DIAGNOSIS — E860 Dehydration: Secondary | ICD-10-CM | POA: Diagnosis present

## 2021-10-01 DIAGNOSIS — I495 Sick sinus syndrome: Secondary | ICD-10-CM | POA: Diagnosis present

## 2021-10-01 DIAGNOSIS — Z794 Long term (current) use of insulin: Secondary | ICD-10-CM

## 2021-10-01 DIAGNOSIS — E785 Hyperlipidemia, unspecified: Secondary | ICD-10-CM | POA: Diagnosis not present

## 2021-10-01 DIAGNOSIS — E669 Obesity, unspecified: Secondary | ICD-10-CM

## 2021-10-01 DIAGNOSIS — I482 Chronic atrial fibrillation, unspecified: Secondary | ICD-10-CM | POA: Diagnosis not present

## 2021-10-01 DIAGNOSIS — E1122 Type 2 diabetes mellitus with diabetic chronic kidney disease: Secondary | ICD-10-CM | POA: Diagnosis not present

## 2021-10-01 DIAGNOSIS — I5032 Chronic diastolic (congestive) heart failure: Secondary | ICD-10-CM | POA: Diagnosis present

## 2021-10-01 DIAGNOSIS — Z888 Allergy status to other drugs, medicaments and biological substances status: Secondary | ICD-10-CM

## 2021-10-01 DIAGNOSIS — I480 Paroxysmal atrial fibrillation: Secondary | ICD-10-CM | POA: Diagnosis present

## 2021-10-01 DIAGNOSIS — Z20822 Contact with and (suspected) exposure to covid-19: Secondary | ICD-10-CM | POA: Diagnosis present

## 2021-10-01 DIAGNOSIS — I2510 Atherosclerotic heart disease of native coronary artery without angina pectoris: Secondary | ICD-10-CM | POA: Diagnosis present

## 2021-10-01 DIAGNOSIS — R7401 Elevation of levels of liver transaminase levels: Secondary | ICD-10-CM | POA: Diagnosis present

## 2021-10-01 DIAGNOSIS — Z885 Allergy status to narcotic agent status: Secondary | ICD-10-CM

## 2021-10-01 DIAGNOSIS — Z881 Allergy status to other antibiotic agents status: Secondary | ICD-10-CM

## 2021-10-01 DIAGNOSIS — Z6834 Body mass index (BMI) 34.0-34.9, adult: Secondary | ICD-10-CM | POA: Diagnosis not present

## 2021-10-01 DIAGNOSIS — I130 Hypertensive heart and chronic kidney disease with heart failure and stage 1 through stage 4 chronic kidney disease, or unspecified chronic kidney disease: Secondary | ICD-10-CM | POA: Diagnosis not present

## 2021-10-01 DIAGNOSIS — G4733 Obstructive sleep apnea (adult) (pediatric): Secondary | ICD-10-CM | POA: Diagnosis present

## 2021-10-01 DIAGNOSIS — I10 Essential (primary) hypertension: Secondary | ICD-10-CM | POA: Diagnosis present

## 2021-10-01 DIAGNOSIS — Z9049 Acquired absence of other specified parts of digestive tract: Secondary | ICD-10-CM | POA: Diagnosis not present

## 2021-10-01 DIAGNOSIS — N179 Acute kidney failure, unspecified: Secondary | ICD-10-CM

## 2021-10-01 DIAGNOSIS — R6520 Severe sepsis without septic shock: Secondary | ICD-10-CM | POA: Diagnosis present

## 2021-10-01 DIAGNOSIS — E119 Type 2 diabetes mellitus without complications: Secondary | ICD-10-CM

## 2021-10-01 DIAGNOSIS — E875 Hyperkalemia: Secondary | ICD-10-CM | POA: Diagnosis not present

## 2021-10-01 DIAGNOSIS — Z880 Allergy status to penicillin: Secondary | ICD-10-CM

## 2021-10-01 DIAGNOSIS — Z8249 Family history of ischemic heart disease and other diseases of the circulatory system: Secondary | ICD-10-CM

## 2021-10-01 DIAGNOSIS — J189 Pneumonia, unspecified organism: Principal | ICD-10-CM | POA: Diagnosis present

## 2021-10-01 DIAGNOSIS — Z7983 Long term (current) use of bisphosphonates: Secondary | ICD-10-CM

## 2021-10-01 DIAGNOSIS — R112 Nausea with vomiting, unspecified: Secondary | ICD-10-CM | POA: Diagnosis present

## 2021-10-01 DIAGNOSIS — Z950 Presence of cardiac pacemaker: Secondary | ICD-10-CM | POA: Diagnosis not present

## 2021-10-01 DIAGNOSIS — L97409 Non-pressure chronic ulcer of unspecified heel and midfoot with unspecified severity: Secondary | ICD-10-CM

## 2021-10-01 DIAGNOSIS — D72829 Elevated white blood cell count, unspecified: Secondary | ICD-10-CM

## 2021-10-01 DIAGNOSIS — E1100 Type 2 diabetes mellitus with hyperosmolarity without nonketotic hyperglycemic-hyperosmolar coma (NKHHC): Secondary | ICD-10-CM

## 2021-10-01 DIAGNOSIS — Z7901 Long term (current) use of anticoagulants: Secondary | ICD-10-CM | POA: Diagnosis not present

## 2021-10-01 DIAGNOSIS — G9341 Metabolic encephalopathy: Secondary | ICD-10-CM | POA: Diagnosis not present

## 2021-10-01 DIAGNOSIS — R4182 Altered mental status, unspecified: Secondary | ICD-10-CM | POA: Diagnosis present

## 2021-10-01 DIAGNOSIS — A419 Sepsis, unspecified organism: Secondary | ICD-10-CM | POA: Diagnosis present

## 2021-10-01 DIAGNOSIS — Z952 Presence of prosthetic heart valve: Secondary | ICD-10-CM

## 2021-10-01 DIAGNOSIS — W06XXXA Fall from bed, initial encounter: Secondary | ICD-10-CM | POA: Diagnosis not present

## 2021-10-01 DIAGNOSIS — Z8673 Personal history of transient ischemic attack (TIA), and cerebral infarction without residual deficits: Secondary | ICD-10-CM

## 2021-10-01 DIAGNOSIS — Z886 Allergy status to analgesic agent status: Secondary | ICD-10-CM

## 2021-10-01 DIAGNOSIS — M797 Fibromyalgia: Secondary | ICD-10-CM | POA: Diagnosis present

## 2021-10-01 LAB — URINALYSIS, ROUTINE W REFLEX MICROSCOPIC
Glucose, UA: 100 mg/dL — AB
Hgb urine dipstick: NEGATIVE
Ketones, ur: NEGATIVE mg/dL
Leukocytes,Ua: NEGATIVE
Nitrite: NEGATIVE
Protein, ur: NEGATIVE mg/dL
Specific Gravity, Urine: 1.025 (ref 1.005–1.030)
pH: 5 (ref 5.0–8.0)

## 2021-10-01 LAB — CBC
HCT: 34.7 % — ABNORMAL LOW (ref 36.0–46.0)
Hemoglobin: 11 g/dL — ABNORMAL LOW (ref 12.0–15.0)
MCH: 28.5 pg (ref 26.0–34.0)
MCHC: 31.7 g/dL (ref 30.0–36.0)
MCV: 89.9 fL (ref 80.0–100.0)
Platelets: 201 10*3/uL (ref 150–400)
RBC: 3.86 MIL/uL — ABNORMAL LOW (ref 3.87–5.11)
RDW: 15.8 % — ABNORMAL HIGH (ref 11.5–15.5)
WBC: 23.3 10*3/uL — ABNORMAL HIGH (ref 4.0–10.5)
nRBC: 0 % (ref 0.0–0.2)

## 2021-10-01 LAB — COMPREHENSIVE METABOLIC PANEL
ALT: 495 U/L — ABNORMAL HIGH (ref 0–44)
AST: 649 U/L — ABNORMAL HIGH (ref 15–41)
Albumin: 3.8 g/dL (ref 3.5–5.0)
Alkaline Phosphatase: 293 U/L — ABNORMAL HIGH (ref 38–126)
Anion gap: 10 (ref 5–15)
BUN: 45 mg/dL — ABNORMAL HIGH (ref 8–23)
CO2: 21 mmol/L — ABNORMAL LOW (ref 22–32)
Calcium: 9.3 mg/dL (ref 8.9–10.3)
Chloride: 105 mmol/L (ref 98–111)
Creatinine, Ser: 2.46 mg/dL — ABNORMAL HIGH (ref 0.44–1.00)
GFR, Estimated: 19 mL/min — ABNORMAL LOW (ref >60)
Glucose, Bld: 182 mg/dL — ABNORMAL HIGH (ref 70–99)
Potassium: 6 mmol/L — ABNORMAL HIGH (ref 3.5–5.1)
Sodium: 136 mmol/L (ref 135–145)
Total Bilirubin: 1.9 mg/dL — ABNORMAL HIGH (ref 0.3–1.2)
Total Protein: 7 g/dL (ref 6.5–8.1)

## 2021-10-01 LAB — BASIC METABOLIC PANEL
Anion gap: 9 (ref 5–15)
BUN: 39 mg/dL — ABNORMAL HIGH (ref 8–23)
CO2: 20 mmol/L — ABNORMAL LOW (ref 22–32)
Calcium: 9 mg/dL (ref 8.9–10.3)
Chloride: 105 mmol/L (ref 98–111)
Creatinine, Ser: 2.44 mg/dL — ABNORMAL HIGH (ref 0.44–1.00)
GFR, Estimated: 19 mL/min — ABNORMAL LOW (ref >60)
Glucose, Bld: 135 mg/dL — ABNORMAL HIGH (ref 70–99)
Potassium: 5.4 mmol/L — ABNORMAL HIGH (ref 3.5–5.1)
Sodium: 134 mmol/L — ABNORMAL LOW (ref 135–145)

## 2021-10-01 LAB — TROPONIN I (HIGH SENSITIVITY)
Troponin I (High Sensitivity): 15 ng/L (ref <18)
Troponin I (High Sensitivity): 13 ng/L (ref <18)

## 2021-10-01 LAB — CBG MONITORING, ED: Glucose-Capillary: 167 mg/dL — ABNORMAL HIGH (ref 70–99)

## 2021-10-01 LAB — GLUCOSE, CAPILLARY: Glucose-Capillary: 119 mg/dL — ABNORMAL HIGH (ref 70–99)

## 2021-10-01 LAB — RESP PANEL BY RT-PCR (FLU A&B, COVID) ARPGX2
Influenza A by PCR: NEGATIVE
Influenza B by PCR: NEGATIVE
SARS Coronavirus 2 by RT PCR: NEGATIVE

## 2021-10-01 LAB — LACTIC ACID, PLASMA: Lactic Acid, Venous: 1.4 mmol/L (ref 0.5–1.9)

## 2021-10-01 MED ORDER — AMIODARONE HCL 200 MG PO TABS
200.0000 mg | ORAL_TABLET | Freq: Every day | ORAL | Status: DC
  Administered 2021-10-02 – 2021-10-03 (×2): 200 mg via ORAL
  Filled 2021-10-01 (×2): qty 1

## 2021-10-01 MED ORDER — SODIUM CHLORIDE 0.9 % IV BOLUS
1000.0000 mL | Freq: Once | INTRAVENOUS | Status: AC
Start: 2021-10-01 — End: 2021-10-01
  Administered 2021-10-01: 1000 mL via INTRAVENOUS

## 2021-10-01 MED ORDER — LEVOFLOXACIN IN D5W 750 MG/150ML IV SOLN
750.0000 mg | Freq: Once | INTRAVENOUS | Status: AC
  Administered 2021-10-01: 750 mg via INTRAVENOUS
  Filled 2021-10-01: qty 150

## 2021-10-01 MED ORDER — INSULIN ASPART 100 UNIT/ML IJ SOLN
0.0000 [IU] | Freq: Three times a day (TID) | INTRAMUSCULAR | Status: DC
  Administered 2021-10-02: 12:00:00 3 [IU] via SUBCUTANEOUS
  Administered 2021-10-02: 07:00:00 1 [IU] via SUBCUTANEOUS
  Administered 2021-10-02 – 2021-10-03 (×2): 2 [IU] via SUBCUTANEOUS
  Administered 2021-10-03: 1 [IU] via SUBCUTANEOUS

## 2021-10-01 MED ORDER — BUPRENORPHINE HCL 300 MCG BU FILM: 1.0000 | ORAL_FILM | Freq: Two times a day (BID) | BUCCAL | Status: DC

## 2021-10-01 MED ORDER — DABIGATRAN ETEXILATE MESYLATE 75 MG PO CAPS
75.0000 mg | ORAL_CAPSULE | Freq: Two times a day (BID) | ORAL | Status: DC
Start: 2021-10-02 — End: 2021-10-01

## 2021-10-01 MED ORDER — SODIUM ZIRCONIUM CYCLOSILICATE 10 G PO PACK
10.0000 g | PACK | Freq: Once | ORAL | Status: AC
  Administered 2021-10-01: 10 g via ORAL
  Filled 2021-10-01: qty 1

## 2021-10-01 MED ORDER — RIVAROXABAN 15 MG PO TABS: 15.0000 mg | ORAL_TABLET | Freq: Every day | ORAL | Status: DC

## 2021-10-01 MED ORDER — ACETAMINOPHEN 650 MG RE SUPP: 650.0000 mg | Freq: Four times a day (QID) | RECTAL | Status: DC | PRN

## 2021-10-01 MED ORDER — SODIUM POLYSTYRENE SULFONATE 15 GM/60ML PO SUSP: 15.0000 g | Freq: Once | ORAL | Status: DC

## 2021-10-01 MED ORDER — LACTATED RINGERS IV SOLN: INTRAVENOUS | Status: AC

## 2021-10-01 MED ORDER — LEVOFLOXACIN IN D5W 500 MG/100ML IV SOLN
500.0000 mg | INTRAVENOUS | Status: DC
  Filled 2021-10-01: qty 100

## 2021-10-01 MED ORDER — ACETAMINOPHEN 325 MG PO TABS: 650.0000 mg | ORAL_TABLET | Freq: Four times a day (QID) | ORAL | Status: DC | PRN

## 2021-10-01 MED ORDER — METRONIDAZOLE 500 MG/100ML IV SOLN
500.0000 mg | Freq: Two times a day (BID) | INTRAVENOUS | Status: DC
  Administered 2021-10-02 – 2021-10-03 (×4): 500 mg via INTRAVENOUS
  Filled 2021-10-01 (×4): qty 100

## 2021-10-01 NOTE — H&P (Signed)
History and Physical    PLEASE NOTE THAT [employer]AGON DICT[employer] SOFTWARE WAS USED IN THE CONSTRUCTION OF THIS NOTE.   Daisy Pearson YIF:[id number] DOB: 1933/12/19 DOA: 10/01/2021  PCP: Karleen Hampshire., MD  Patient coming from: home   I have personally briefly reviewed patient's old medical records in [place]  Chief Complaint: Altered mental status  HPI: Daisy Pearson is a 88 y.o. female with medical history significant for type 2 diabetes mellitus, paroxysmal atrial fibrillation complicated by sick sinus syndrome status post pacemaker placement chronically anticoagulated, chronic diastolic heart failure, stage IIIb chronic kidney disease associated baseline creatinine 1.5-1.6, essential hypertension, hyperlipidemia, who is admitted to [facility] on 10/01/2021 by way of transfer from [place] with suspected acute metabolic encephalopathy after presenting from home to the latter facility for evaluation of altered mental status.  In the setting of the patient's altered mental status, the following history is provided by the patient's family as well as via chart review.  Family is concerned that the patient has exhibited 2 to 3 days of progressive confusion relative to her baseline mental status.  Patient recently diagnosed with cellulitis in the lower extremity and recently completed course of will appears to be with Bactrim as well as doxycycline, with reported interval resolution of her lower extremity cellulitis.  Over the first few days of this antibiotic course, family noted the patient to exhibit some evidence of nausea/vomiting associated with 2-3 episodes of nonbloody, nonbilious emesis.  They conveyed that this is subsequently improved, with no episodes of vomiting for at least the last 24 hours.  No known diarrhea.  Patient herself is without any acute complaints at this time.  Medical history is notable for stage IIIb chronic kidney disease with associated  serum creatinine range of 1.5-1.6.    She subsequently presented to [place] ED earlier today for further evaluation management of altered mental status.     [facility] ED Course:  Vital signs in the ED were notable for the following: Afebrile; heart rate 60-64; blood pressure initially 104/71, with ensuing improvement to 132/74 following interval initiation of IV fluids, as further detailed below; respiratory rate 16-25, oxygen saturation 96 to 100% on room air.  Labs were notable for the following: CMP notable for the following: Potassium 6.0, bicarbonate 21 , Anion gap 10, BUN 45, creatinine 2.46, glucose 182.  Additionally, liver enzymes per today CMP were notable for the following, as compared to most recent prior set of liver enzymes, which were collected on 09/27/2021: Alkaline phosphatase 293 compared to 71 on 09/27/2021, at AST 649 compared to 33, ALT 495, compared to bilirubin 1.9 compared to 0.5 when checked on 09/27/2021.  CBC notable for white blood cell count 23,000.  Lactic acid 1.4.  Urinalysis showed moderate blood cells, leukocyte esterase/nitrate negative, specific gravity 1.025.  COVID-19/influenza PCR negative.  Blood cultures x2 collected prior to initiation of IV antibiotics.  Imaging and additional notable ED work-up: EKG showed atrial paced rhythm with heart rate 61, no evidence of T wave or ST changes.  CT head showed no evidence of acute intracranial process, including no evidence of intracranial bleed nor any evidence of acute ischemic infarct.  CT chest without contrast showed nonspecific patchy groundglass opacities in bilateral lungs, potentially reflecting mild edema vs inflammation, vs scarring, without any evidence of airspace opacity, nor any evidence of pleural effusion or pneumothorax.  While in the ED, the following were administered: [facility]  10 mg p.o. x1, Levaquin 750 mg IV x1, normal saline x1 L bolus.  Subsequently, the patient was  transferred to [facility] for further evaluation and management of presenting suspected acute metabolic encephalopathy, complicated by acute kidney injury, hyperkalemia, and finding of acute transaminitis.      Review of Systems: As per HPI otherwise 10 point review of systems negative.   Past Medical History:  Diagnosis Date   Acute pancreatitis 02/14/2017   Archie Endo 02/14/2017   Arthritis    "knees, skeletal joints" (02/19/2017)   Chronic lower back pain    CKD (chronic kidney disease), stage IV ([place])    hx/notes 02/14/2017   Coronary artery disease    Fibromyalgia    Hyperlipidemia    hx/notes 02/14/2017   Hypertension    PAF (paroxysmal atrial fibrillation) ([place])    hx/notes 02/14/2017   Presence of permanent cardiac pacemaker    Sleep apnea    "wore mask til ~ 18 months ago; not a problem now" (02/19/2017)   Stroke [facility]) 2010   "got TPA: completely resolved" (02/19/2017)   Type II diabetes mellitus ([place])    hx/notes 02/14/2017    Past Surgical History:  Procedure Laterality Date   ADRENALECTOMY  2006   partial; pheoochromocytoma removed   AORTIC VALVE REPLACEMENT (AVR)/CORONARY ARTERY BYPASS GRAFTING (CABG)     BACK SURGERY     BREAST SURGERY     "had some calcified tissue removed"   CARDIAC CATHETERIZATION  2014   CARDIAC VALVE REPLACEMENT     CHOLECYSTECTOMY N/A 02/22/2017   Procedure: LAPAROSCOPIC CHOLECYSTECTOMY WITH INTRAOPERATIVE CHOLANGIOGRAM;  Surgeon: Ralene Ok, MD;  Location: [place];  Service: General;  Laterality: N/A;   CORONARY ARTERY BYPASS GRAFT  2014   CABG X2   ERCP  02/19/2017   ERCP N/A 02/19/2017   Procedure: ENDOSCOPIC RETROGRADE CHOLANGIOPANCREATOGRAPHY (ERCP);  Surgeon: Clarene Essex, MD;  Location: [place];  Service: Endoscopy;  Laterality: N/A;   FIXATION KYPHOPLASTY LUMBAR SPINE     INSERT / REPLACE / REMOVE PACEMAKER  04/05/2013   [employer] Serial# [id number] Lemmie Evens [id number], SERIAL #[id number]   KNEE ARTHROSCOPY Right    "torn  meniscus"   PARTIAL THYMECTOMY  1964    Social History:  reports that she has never smoked. She has never used smokeless tobacco. She reports that she does not drink alcohol and does not use drugs.   Allergies  Allergen Reactions   Esomeprazole Sodium Swelling   Penicillins Anaphylaxis and Rash    Has patient had a PCN reaction causing immediate rash, facial/tongue/throat swelling, SOB or lightheadedness with hypotension: Yes Has patient had a PCN reaction causing severe rash involving mucus membranes or skin necrosis: No Has patient had a PCN reaction that required hospitalization: No Has patient had a PCN reaction occurring within the last 10 years: No If all of the above answers are "NO", then may proceed with Cephalosporin use.   Bactrim [Sulfamethoxazole-Trimethoprim] Nausea And Vomiting   Clindamycin/Lincomycin Hives        Hydrocodone Other (See Comments)    Makes her crazy   Nsaids Hives and Other (See Comments)    kidney failure   Valsartan Swelling   Hydrocodone-Acetaminophen Anxiety    Other reaction(s): Confusion (intolerance), Malaise (intolerance)   Prilosec [Omeprazole] Swelling and Rash    Family History  Problem Relation Age of Onset   Hypertension Father     Family history reviewed and not pertinent    Prior to Admission medications   Medication Sig Start Date  End Date Taking? Authorizing Provider  acetaminophen (TYLENOL) 325 MG tablet Take 650 mg by mouth every 6 (six) hours as needed for headache (pain).    [provider]  alendronate (FOSAMAX) 70 MG tablet Take 70 mg by mouth every Saturday. Take with a full glass of water on an empty stomach.    [provider]  amiodarone (PACERONE) 200 MG tablet Take 200 mg by mouth daily.    [provider]  amLODipine (NORVASC) 5 MG tablet Take 1 tablet (5 mg total) by mouth daily. 02/24/17   Ina Homes, MD  atorvastatin (LIPITOR) 40 MG tablet Take 40 mg by mouth daily.    [provider]  Buprenorphine HCl (BELBUCA) 300 MCG FILM Place 1 Film inside cheek 2 (two) times daily.  03/20/19   [provider]  calcitRIOL (ROCALTROL) 0.5 MCG capsule Take 0.5 mcg by mouth daily.    [provider]  cetirizine (ZYRTEC) 10 MG tablet Take 10 mg by mouth daily as needed for allergies.    [provider]  Cholecalciferol (VITAMIN D) 2000 units tablet Take 4,000 Units by mouth daily.     [provider]  dabigatran (PRADAXA) 75 MG CAPS capsule Take 75 mg by mouth 2 (two) times daily.  10/12/18   [provider]  diclofenac Sodium (VOLTAREN) 1 % GEL Apply 2 g topically 2 (two) times daily as needed (joint pain).    [provider]  doxycycline (VIBRAMYCIN) 100 MG capsule Take 1 capsule (100 mg total) by mouth 2 (two) times daily for 5 days. 09/27/21 10/02/21  Gareth Morgan, MD  enalapril (VASOTEC) 2.5 MG tablet Take 2.5 mg by mouth daily. 07/14/19   [provider]  EPINEPHrine (EPIPEN 2-PAK) 0.3 mg/0.3 mL IJ SOAJ injection Inject 0.3 mg into the muscle once as needed (severe allergic reaction).    [provider]  furosemide (LASIX) 20 MG tablet Take 1 tablet (20 mg total) by mouth daily for 2 days. 05/27/21 05/29/21  Suzy Bouchard, PA-C  Insulin Detemir (LEVEMIR FLEXTOUCH) 100 UNIT/ML Pen Inject 18 Units into the skin 2 (two) times daily.    [provider]  Insulin Glulisine (APIDRA SOLOSTAR) 100 UNIT/ML Solostar Pen Inject 12 Units into the skin See admin instructions. Inject 12 units subcutaneously three times daily before meals - plus adjustment if CBG >200 - 1 unit for every 20    [provider]  ipratropium (ATROVENT) 0.06 % nasal spray Place 2 sprays into both nostrils 4 (four) times daily as needed. Affected nostril 08/02/19   [provider]  metaxalone (SKELAXIN) 800 MG tablet Take 800 mg by mouth 3 (three) times daily as needed for muscle spasms.    [provider]   Methylfol-Methylcob-Acetylcyst (CEREFOLIN NAC) 6-2-600 MG TABS Take 1 tablet by mouth daily.    [provider]  metoprolol tartrate (LOPRESSOR) 25 MG tablet Take 25 mg by mouth 2 (two) times daily.    [provider]  mirtazapine (REMERON) 15 MG tablet Take 15 mg by mouth at bedtime.    [provider]  nitroGLYCERIN (NITROSTAT) 0.4 MG SL tablet Place 0.4 mg under the tongue every 5 (five) minutes as needed for chest pain.    [provider]  Omega-3 Fatty Acids (OMEGA 3 PO) Take 1 capsule by mouth daily.    [provider]  ondansetron (ZOFRAN-ODT) 4 MG disintegrating tablet Take 1 tablet (4 mg total) by mouth every 8 (eight) hours as needed for  nausea or vomiting. 09/27/21   Gareth Morgan, MD  oxyCODONE-acetaminophen (PERCOCET/ROXICET) 5-325 MG tablet Take 1 tablet by mouth 2 (two) times daily as needed for severe pain.    [provider]  QUEtiapine (SEROQUEL) 25 MG tablet Take 25 mg by mouth at bedtime.    [provider]     Objective    Physical Exam: Vitals:   10/01/21 2049 10/01/21 2050 10/01/21 2159 10/01/21 2325  BP:  137/85 132/74 (!) 122/53  Pulse: 60  60 78  Resp: _0 Temp:   97.6 F (36.4 C) 98.2 F (36.8 C)  TempSrc:   Oral Oral  SpO2: 100%  98% 96%    General: appears to be stated age; alert, confused Skin: warm, dry, no rash Head:  AT/[place] Mouth:  Oral mucosa membranes appear dry, normal dentition Neck: supple; trachea midline Heart:  RRR; did not appreciate any M/R/G Lungs: CTAB, did not appreciate any wheezes, rales, or rhonchi Abdomen: + BS; soft, ND, NT Vascular: 2+ pedal pulses b/l; 2+ radial pulses b/l Extremities: no peripheral edema, no muscle wasting Neuro: strength and sensation intact in upper and lower extremities b/l   Labs on Admission: I have personally reviewed following labs and imaging studies  CBC: Recent Labs  Lab 09/27/21 1325 10/01/21 1539  WBC 7.0 23.3*   NEUTROABS 4.8  --   HGB 12.0 11.0*  HCT 37.6 34.7*  MCV 88.9 89.9  PLT 217 449   Basic Metabolic Panel: Recent Labs  Lab 09/27/21 1325 10/01/21 1539 10/01/21 2258  NA 134* 136 134*  K 4.9 6.0* 5.4*  CL 103 105 105  CO2 21* 21* 20*  GLUCOSE 68* 182* 135*  BUN 31* 45* 39*  CREATININE 2.53* 2.46* 2.44*  CALCIUM 9.3 9.3 9.0   GFR: Estimated Creatinine Clearance: 18.3 mL/min (A) (by C-G formula based on SCr of 2.44 mg/dL (H)). Liver Function Tests: Recent Labs  Lab 09/27/21 1325 10/01/21 1539  AST 33 649*  ALT 24 495*  ALKPHOS 71 293*  BILITOT 0.5 1.9*  PROT 7.0 7.0  ALBUMIN 3.7 3.8   Recent Labs  Lab 09/27/21 1325  LIPASE 22   No results for input(s): AMMONIA in the last 168 hours. Coagulation Profile: No results for input(s): INR, PROTIME in the last 168 hours. Cardiac Enzymes: No results for input(s): CKTOTAL, CKMB, CKMBINDEX, TROPONINI in the last 168 hours. BNP (last 3 results) No results for input(s): PROBNP in the last 8760 hours. HbA1C: No results for input(s): HGBA1C in the last 72 hours. CBG: Recent Labs  Lab 10/01/21 1550 10/01/21 2323  GLUCAP 167* 119*   Lipid Profile: No results for input(s): CHOL, HDL, LDLCALC, TRIG, CHOLHDL, LDLDIRECT in the last 72 hours. Thyroid Function Tests: No results for input(s): TSH, T4TOTAL, FREET4, T3FREE, THYROIDAB in the last 72 hours. Anemia Panel: No results for input(s): VITAMINB12, FOLATE, FERRITIN, TIBC, IRON, RETICCTPCT in the last 72 hours. Urine analysis:    Component Value Date/Time   COLORURINE YELLOW 10/01/2021 1539   APPEARANCEUR CLEAR 10/01/2021 1539   LABSPEC 1.025 10/01/2021 1539   PHURINE 5.0 10/01/2021 1539   GLUCOSEU 100 (A) 10/01/2021 1539   HGBUR NEGATIVE 10/01/2021 1539   BILIRUBINUR SMALL (A) 10/01/2021 1539   KETONESUR NEGATIVE 10/01/2021 1539   PROTEINUR NEGATIVE 10/01/2021 1539   NITRITE NEGATIVE 10/01/2021 1539   LEUKOCYTESUR NEGATIVE 10/01/2021 1539    Radiological Exams  on Admission: DG Chest 2 View  Result Date: 10/01/2021 CLINICAL DATA:  Shortness of  breath EXAM: CHEST - 2 VIEW COMPARISON:  Chest radiograph dated September 27, 2021 FINDINGS: The heart is enlarged. Evidence of prior CABG. Pacemaker leads are unchanged. Aorta is tortuous with atherosclerotic calcifications. Lungs are clear without evidence of focal consolidation or pleural effusion. IMPRESSION: No active cardiopulmonary disease. Electronically Signed   By: Keane Police D.O.   On: 10/01/2021 16:21   CT Head Wo Contrast  Result Date: 10/01/2021 CLINICAL DATA:  Mental status change. EXAM: CT HEAD WITHOUT CONTRAST TECHNIQUE: Contiguous axial images were obtained from the base of the skull through the vertex without intravenous contrast. RADIATION DOSE REDUCTION: This exam was performed according to the departmental dose-optimization program which includes automated exposure control, adjustment of the mA and/or kV according to patient size and/or use of iterative reconstruction technique. COMPARISON:  CT head dated August 16, 2021 FINDINGS: Brain: No evidence of acute infarction, hemorrhage, hydrocephalus, extra-axial collection or mass lesion/mass effect. Cerebral atrophy and chronic microvascular ischemic changes of the white matter, unchanged Vascular: No hyperdense vessel or unexpected calcification. Skull: Normal. Negative for fracture or focal lesion. Sinuses/Orbits: Bilateral cataract surgery.  No acute abnormality Other: None. IMPRESSION: 1.  No acute intracranial abnormality. 2. Cerebral atrophy and chronic microvascular ischemic changes of the white matter, unchanged. Electronically Signed   By: Keane Police D.O.   On: 10/01/2021 15:26   CT Chest Wo Contrast  Result Date: 10/01/2021 CLINICAL DATA:  Respiratory illness, nondiagnostic x-ray. EXAM: CT CHEST WITHOUT CONTRAST TECHNIQUE: Multidetector CT imaging of the chest was performed following the standard protocol without IV contrast. RADIATION DOSE  REDUCTION: This exam was performed according to the departmental dose-optimization program which includes automated exposure control, adjustment of the mA and/or kV according to patient size and/or use of iterative reconstruction technique. COMPARISON:  Chest radiographs today, 09/27/2021 and 05/27/2021. Abdominal MRI 02/17/2017. FINDINGS: Cardiovascular: There is diffuse atherosclerosis of the aorta, great vessels and coronary arteries status post median sternotomy and CABG. Right subclavian pacemaker leads extend into the right atrium and right ventricle. The heart is enlarged. No significant pericardial fluid. Mediastinum/Nodes: There are no enlarged mediastinal, hilar or axillary lymph nodes.There are scattered small calcified mediastinal and right hilar lymph nodes. Evidence of previous right thyroid lobectomy. 1.1 cm low-density nodule in the left thyroid on image 19/2; No followup recommended.(Ref: J Am Coll Radiol. 2015 Feb;12(2): 143-50). Diffuse calcifications of the tracheobronchial tree. No significant esophageal abnormalities. Lungs/Pleura: No pleural effusion or pneumothorax. Pulmonary assessment mildly limited by breathing artifact. There is mild central airway thickening with scattered ground-glass opacities which could reflect edema, inflammation or scarring. There are scattered calcified granulomas. No confluent airspace opacity or suspicious pulmonary nodule. Upper abdomen: No significant findings are seen within the visualized upper abdomen. Diffuse aortic and branch vessel atherosclerosis. Musculoskeletal/Chest wall: There is no chest wall mass or suspicious osseous finding. Previous L1 spinal augmentation and median sternotomy. Degenerative changes at the sternoclavicular joints bilaterally. IMPRESSION: 1. Nonspecific patchy ground-glass opacities in both lungs which could reflect mild edema, inflammation or scarring. Suggest radiographic follow-up. 2. No confluent airspace opacity or  significant pleural effusion. 3. Cardiomegaly post CABG. Coronary and Aortic Atherosclerosis (ICD10-I70.0). 4. Sequela of prior granulomatous disease. Electronically Signed   By: Richardean Sale M.D.   On: 10/01/2021 17:31     EKG: Independently reviewed, with result as described above.    Assessment/Plan   Principal Problem:   Acute metabolic encephalopathy Active Problems:   Type 2 diabetes mellitus (HCC)   Essential hypertension   PAF (paroxysmal  atrial fibrillation) (HCC)   Acute renal failure superimposed on stage 3b chronic kidney disease (HCC)   Hyperkalemia   Transaminitis   Severe sepsis (HCC)   Hyperlipidemia   Chronic diastolic CHF (congestive heart failure) ([place])      #) Acute metabolic encephalopathy: 2 to 3 days of confusion, which appears to be multifactorial in nature, with suspected contributions from dehydration in the setting of recent nausea/vomiting and corresponding decline in oral intake, as well as contribution from acute kidney injury superimposed on CKD 3B, with resultant buildup of multiple renally metabolized medications, including that of outpatient prn Percocet.  Additional potential pharmacologic contribution from use of  Buprenorphine . there may also be an infectious contribution in the setting of positive SIRS criteria with evidence of acute transaminitis.  There is also CT evidence of ground-glass opacities, without overt infiltrate. Will further assess for underlying infectious process with right upper quadrant ultrasound as well as procalcitonin, respectively, while providing empiric antibiotic coverage pending these results.  No additional infectious process identified at this time, including UA that was inconsistent with UTI, while COVID-19/influenza PCR found to be negative.  No overt acute focal neurologic deficits to suggest a contribution from an underlying acute CVA, while presenting CT head showed no evidence of acute intracranial process..  Seizures are also felt to be less likely. Will check VBG to evaluate for any contribution from hypercapnic encephalopathy.    Plan: Repeat CMP/CBC in the AM. Check magnesium level. check VBG, TSH, MMA.  Hold outpatient central acting medications, including prn Percocet, Seroquel, buprenorphine.  Further evaluation management of AKI on CKD 3B, including gentle IV fluids, as further detailed below.  Check right quadrant ultrasound.  Check procalcitonin.  IV antibiotics, as further detailed below.  Check ammonia level.  Hold home Buprenorphine .  Patient       #) AKI on CKD 3B: In the context of documented history of CKD 3B associated with baseline creatinine range of 1.5-1.6, presenting creatinine noted to be 2.46.  Likely multifactorial, with evidence of prerenal contributions from dehydration in the setting of recent decline in oral intake associated with recent nausea/vomiting, in addition to suspected pharmacologic contribution from recent use of Bactrim serving as a potassium sparing diuretic, which also appears consistent with presenting labs associated with hyperkalemia.  Symptoms additional pharmacologic contribution from home enalapril.  Of note, presenting urinalysis without evidence of urinary casts, but was associated with elevated specific gravity, consistent with suspected element of dehydration.  Plan: Hold home enalapril.  Gentle IV fluids overnight in the form of lactated Ringer's at 50 cc/h x 8 hours.  Monitor strict I's and O's and daily weights.  Tempt avoid nephrotoxic agents.  Repeat BMP in the morning.  Hold home Percocet to limit potential nephrotoxic implications of this medication, particularly given interval decline in renal clearance of this med.        #) Hyperkalemia: Presenting serum potassium to be 6.0 without evidence of associated EKG changes.  Likely multifactorial, with suspected contribution from recent use of Bactrim, possibly has a potassium sparing medication  in addition to contribution from acute kidney injury as well as dehydration.  Outpatient medications also notable for enalapril.  Status post dose of Lokelma at [facility] along with administration of a liter of NS.   Plan: check STAT BMP, will evaluation for additional pharmacologic intervention today: Interval trend in serum potassium level.  Monitor on symmetry.  Add on serum magnesium level.  Hold home enalapril.  CMP ordered for  the morning.        #) acute transaminitis: Presenting labs reflect acute transaminitis, without overt evidence of cholestatic pattern, although interval increase in alk phos as well as T. bili relative to 5 days ago was noted.  Suspect contribution from doxycycline given interval use of such, particular given that this is a known side effect of this particular antibiotic.  However, given presenting altered mental status as well as positive SIRS criteria we will also pursue abdominal imaging to further evaluate for evidence of acute cholecystitis, and provide interval IV antibiotics as below.  Note, recently completed course of Bactrim, which can also be associated acute hepatic injury.  Plan: Right upper quadrant ultrasound, as above.  Continue Levaquin initiated in the ED for potential pneumonia, while adding Flagyl for anaerobic coverage.  Repeat CMP in the morning.  Check direct bili, GGT.  Hold home Lipitor.        #) Severe sepsis: SIRS criteria met via leukocytosis, tachypnea.  This is in the context of potential underlying infection, although not confirmed at this time, with potential infectious sources including the possibility of pneumonia in the setting of chest x-ray showing evidence of groundglass opacities versus acute cholecystitis in the setting of acute transaminitis.   Lactic acid level: 1.4. Of note, given the associated presence of suspected end organ damage in the form of concominant presenting acute metabolic encephalopathy, criteria are met for  pt's sepsis to be considered severe in nature. However, in the absence of lactic acid level that is greater than or equal to 4.0, and in the absence of any associated hypotension refractory to IVF's, there are no indications for administration of a 30 mL/kg IVF bolus at this time.   Additional ED work-up/management notable for: UA that is consistent with UTI.  COVID-19/influenza PCR negative.  Blood cultures x2 collected in the ED prior to initiation of IV Levaquin. Will continue this antibiotic for associated pneumonia coverage, Wieting Flagyl for anaerobic coverage in the setting of aforementioned acute transaminitis, pending results of right upper quadrant ultrasound.   Plan: CBC w/ diff and CMP in AM.  Follow for results of blood cx's x 2. Abx: Levaquin, Flagyl, as above.  Check procalcitonin level.  Right upper quadrant ultrasound.         #) Type 2 Diabetes Mellitus: documented history of such. Home insulin regimen: Levemir 18 units subcu twice daily as well as scheduled short acting insulin to be used daily with meals. Home oral hypoglycemic agents: None. presenting blood sugar: 182.  However, per chart review, it appears patient had an episode of hypoglycemia 4 to 5 days ago, at which time blood sugar was noted to be in the 60s.  Consequently, will hold home scheduled short acting insulin for now, and also hold home basal insulin, with close attention to interval fasting blood sugar level in the morning to help guide timing of resumption of outpatient basal insulin.   Plan: accuchecks QAC and HS with low dose SSI.  Holding home scheduled basal/short-acting insulin for now, as further detailed above.         #) Paroxysmal atrial fibrillation: Documented history of such. In setting of CHA2DS2-VASc score of 8, there is an indication for chronic anticoagulation for thromboembolic prophylaxis. Consistent with this, patient is chronically anticoagulated on Xarelto. Home AV nodal blocking  regimen: Metoprolol tartrate.  Additionally, she is on amiodarone as an outpatient.  Most recent echocardiogram occurred in July 2018, which was notable for LVEF 60 to 65%  grade 2 diastolic dysfunction, mild mitral regurgitation, mild-moderate tricuspid regurgitation.  Her A-fib has been complicated by sick sinus syndrome status post pacemaker placement.    Plan: monitor strict I's & O's and daily weights. Repeat BMP/CBC in AM. Check serum mag level. Continue home AV nodal blocking regimen.  Continue outpatient amiodarone as well as Xarelto.       #) Chronic diastolic heart failure: documented history of such, with most recent echocardiogram performed in July 2018, which is notable for grade 2 diastolic dysfunction with additional details as conveyed above. No clinical evidence to suggest acutely decompensated heart failure at this time. home diuretic regimen reportedly consists of the following: None.    Plan: monitor strict I's & O's and daily weights. Repeat BMP in AM. Check serum mag level.      #) Essential Hypertension: documented h/o such, with outpatient antihypertensive regimen including enalapril, Norvasc.  SBP's in the ED today: Normotensive.  However, in setting of criteria met for severe sepsis, potentially on basis of multifactorial infectious process, will hold home antihypertensive medications.  Additionally, in the setting of presenting AKI, will also hold home enalapril for now.  Plan: Close monitoring of subsequent BP via routine VS. hold home and evidence medications for now, as above.       #) Hyperlipidemia: documented h/o such. On high intensity atorvastatin as outpatient.  In the setting of acute transaminitis, will hold home atorvastatin for now.   Plan: Hold home statin.       DVT prophylaxis: SCD's + home Xarelto Code Status: Full code disposition Plan: Per Rounding Team Consults called: none;  Admission status: obs;    PLEASE NOTE THAT DRAGON  DICTATION SOFTWARE WAS USED IN THE CONSTRUCTION OF THIS NOTE.   [place] DO [facility]  From [place]   10/01/2021, 11:53 PM

## 2021-10-01 NOTE — Progress Notes (Incomplete)
Assessment and Plan: No notes have been filed under this hospital service. Service: Hospitalist

## 2021-10-01 NOTE — ED Notes (Signed)
Placed in exam room 12, pt high fall risk, sr x 2 up, bed in lowest position, family in room  ?

## 2021-10-01 NOTE — ED Provider Notes (Cosign Needed)
[place] EMERGENCY DEPARTMENT Provider Note   CSN: [id number] Arrival date & time: 10/01/21  1323     History  Chief Complaint  Patient presents with   Cough   Fall    Daisy Pearson is a 88 y.o. female who presents the emergency department for shortness of breath for 2 days and a fall earlier today.  Daughter at bedside states that patient has overall been not feeling well, with some flulike symptoms.  They state that today she "slid out of the bed onto the floor".  Patient denies any head trauma or loss of consciousness.  She denies any trauma from the fall.  Patient states that every time she tries to lay down she feels that it is hard to catch her breath.  She is also complaining of some left-sided rib pain, and pain between her shoulder blades.  Daughter states that patient has seemed more confused, was having some difficulty finding her words earlier today.  There was no abrupt change.  Cough Associated symptoms: chest pain and shortness of breath   Associated symptoms: no chills and no fever   Fall Associated symptoms include chest pain and shortness of breath. Pertinent negatives include no abdominal pain.      Home Medications Prior to Admission medications   Medication Sig Start Date End Date Taking? Authorizing Provider  acetaminophen (TYLENOL) 325 MG tablet Take 650 mg by mouth every 6 (six) hours as needed for headache (pain).    [provider]  alendronate (FOSAMAX) 70 MG tablet Take 70 mg by mouth every Saturday. Take with a full glass of water on an empty stomach.    [provider]  amiodarone (PACERONE) 200 MG tablet Take 200 mg by mouth daily.    [provider]  amLODipine (NORVASC) 5 MG tablet Take 1 tablet (5 mg total) by mouth daily. 02/24/17   Ina Homes, MD  atorvastatin (LIPITOR) 40 MG tablet Take 40 mg by mouth daily.    [provider]  Buprenorphine HCl (BELBUCA) 300 MCG FILM Place 1 Film inside  cheek 2 (two) times daily.  03/20/19   [provider]  calcitRIOL (ROCALTROL) 0.5 MCG capsule Take 0.5 mcg by mouth daily.    [provider]  cetirizine (ZYRTEC) 10 MG tablet Take 10 mg by mouth daily as needed for allergies.    [provider]  Cholecalciferol (VITAMIN D) 2000 units tablet Take 4,000 Units by mouth daily.     [provider]  dabigatran (PRADAXA) 75 MG CAPS capsule Take 75 mg by mouth 2 (two) times daily.  10/12/18   [provider]  diclofenac Sodium (VOLTAREN) 1 % GEL Apply 2 g topically 2 (two) times daily as needed (joint pain).    [provider]  doxycycline (VIBRAMYCIN) 100 MG capsule Take 1 capsule (100 mg total) by mouth 2 (two) times daily for 5 days. 09/27/21 10/02/21  Gareth Morgan, MD  enalapril (VASOTEC) 2.5 MG tablet Take 2.5 mg by mouth daily. 07/14/19   [provider]  EPINEPHrine (EPIPEN 2-PAK) 0.3 mg/0.3 mL IJ SOAJ injection Inject 0.3 mg into the muscle once as needed (severe allergic reaction).    [provider]  furosemide (LASIX) 20 MG tablet Take 1 tablet (20 mg total) by mouth daily for 2 days. 05/27/21 05/29/21  Suzy Bouchard, PA-C  Insulin Detemir (LEVEMIR FLEXTOUCH) 100 UNIT/ML Pen Inject 18 Units into the skin 2 (two) times daily.    [provider]  Insulin Glulisine (APIDRA SOLOSTAR) 100 UNIT/ML Solostar Pen Inject 12 Units into the skin See admin instructions. Inject 12 units subcutaneously three times daily before meals - plus adjustment if CBG >200 - 1 unit for every 20    [provider]  ipratropium (ATROVENT) 0.06 % nasal spray Place 2 sprays into both nostrils 4 (four) times daily as needed. Affected nostril 08/02/19   [provider]  metaxalone (SKELAXIN) 800 MG tablet Take 800 mg by mouth 3 (three) times daily as needed for muscle spasms.    [provider]  Methylfol-Methylcob-Acetylcyst (CEREFOLIN NAC) 6-2-600 MG TABS Take 1 tablet  by mouth daily.    [provider]  metoprolol tartrate (LOPRESSOR) 25 MG tablet Take 25 mg by mouth 2 (two) times daily.    [provider]  mirtazapine (REMERON) 15 MG tablet Take 15 mg by mouth at bedtime.    [provider]  nitroGLYCERIN (NITROSTAT) 0.4 MG SL tablet Place 0.4 mg under the tongue every 5 (five) minutes as needed for chest pain.    [provider]  Omega-3 Fatty Acids (OMEGA 3 PO) Take 1 capsule by mouth daily.    [provider]  ondansetron (ZOFRAN-ODT) 4 MG disintegrating tablet Take 1 tablet (4 mg total) by mouth every 8 (eight) hours as needed for nausea or vomiting. 09/27/21   Gareth Morgan, MD  oxyCODONE-acetaminophen (PERCOCET/ROXICET) 5-325 MG tablet Take 1 tablet by mouth 2 (two) times daily as needed for severe pain.    [provider]  QUEtiapine (SEROQUEL) 25 MG tablet Take 25 mg by mouth at bedtime.    [provider]      Allergies    Esomeprazole sodium, Penicillins, Bactrim [sulfamethoxazole-trimethoprim], Clindamycin/lincomycin, Hydrocodone, Nsaids, Valsartan, Hydrocodone-acetaminophen, and Prilosec [omeprazole]    Review of Systems   Review of Systems  Constitutional:  Negative for chills and fever.  HENT:  Negative for congestion.   Respiratory:  Positive for cough and shortness of breath.   Cardiovascular:  Positive for chest pain. Negative for palpitations and leg swelling.       Left sided rib pain  Gastrointestinal:  Negative for abdominal pain, constipation, diarrhea, nausea and vomiting.  Genitourinary:  Negative for dysuria, frequency, hematuria and urgency.  Musculoskeletal:  Positive for back pain.  Psychiatric/Behavioral:  Positive for confusion.   All other systems reviewed and are negative.  Physical Exam Updated Vital Signs BP (!) 117/48    Pulse (!) 59    Temp 98.9 F (37.2 C) (Oral)    Resp 20    SpO2 96%  Physical Exam Vitals and nursing note reviewed.   Constitutional:      Appearance: Normal appearance.  HENT:     Head: Normocephalic and atraumatic.  Eyes:     Conjunctiva/sclera: Conjunctivae normal.  Cardiovascular:     Rate and Rhythm: Normal rate and regular rhythm.  Pulmonary:     Effort: Pulmonary effort is normal. No respiratory distress.     Breath sounds: Normal breath sounds.  Chest:     Comments: Generalized tenderness of left lateral chest, no focal tenderness.  Abdominal:     General: There is no distension.     Palpations: Abdomen is soft.     Tenderness: There is no abdominal tenderness.  Skin:    General: Skin is warm and dry.  Neurological:     General: No focal deficit present.     Mental Status: She is alert.    ED Results / Procedures /  Treatments   Labs (all labs ordered are listed, but only abnormal results are displayed) Labs Reviewed  COMPREHENSIVE METABOLIC PANEL - Abnormal; Notable for the following components:      Result Value   Potassium 6.0 (*)    CO2 21 (*)    Glucose, Bld 182 (*)    BUN 45 (*)    Creatinine, Ser 2.46 (*)    AST 649 (*)    ALT 495 (*)    Alkaline Phosphatase 293 (*)    Total Bilirubin 1.9 (*)    GFR, Estimated 19 (*)    All other components within normal limits  CBC - Abnormal; Notable for the following components:   WBC 23.3 (*)    RBC 3.86 (*)    Hemoglobin 11.0 (*)    HCT 34.7 (*)    RDW 15.8 (*)    All other components within normal limits  URINALYSIS, ROUTINE W REFLEX MICROSCOPIC - Abnormal; Notable for the following components:   Glucose, UA 100 (*)    Bilirubin Urine SMALL (*)    All other components within normal limits  CBG MONITORING, ED - Abnormal; Notable for the following components:   Glucose-Capillary 167 (*)    All other components within normal limits  RESP PANEL BY RT-PCR (FLU A&B, COVID) ARPGX2  CULTURE, BLOOD (ROUTINE X 2)  CULTURE, BLOOD (ROUTINE X 2)  LACTIC ACID, PLASMA  TROPONIN I (HIGH SENSITIVITY)  TROPONIN I (HIGH SENSITIVITY)     EKG EKG Interpretation  Date/Time:  Wednesday October 01 2021 13:33:12 EST Ventricular Rate:  61 PR Interval:  320 QRS Duration: 88 QT Interval:  462 QTC Calculation: 465 R Axis:   40 Text Interpretation: Atrial-paced rhythm with prolonged AV conduction Cannot rule out Anterior infarct , age undetermined Abnormal ECG When compared with ECG of 27-Sep-2021 13:06, PREVIOUS ECG IS PRESENT Confirmed by Octaviano Glow [id number]) on 10/01/2021 2:13:39 PM  Radiology DG Chest 2 View  Result Date: 10/01/2021 CLINICAL DATA:  Shortness of breath EXAM: CHEST - 2 VIEW COMPARISON:  Chest radiograph dated September 27, 2021 FINDINGS: The heart is enlarged. Evidence of prior CABG. Pacemaker leads are unchanged. Aorta is tortuous with atherosclerotic calcifications. Lungs are clear without evidence of focal consolidation or pleural effusion. IMPRESSION: No active cardiopulmonary disease. Electronically Signed   By: Keane Police D.O.   On: 10/01/2021 16:21   CT Head Wo Contrast  Result Date: 10/01/2021 CLINICAL DATA:  Mental status change. EXAM: CT HEAD WITHOUT CONTRAST TECHNIQUE: Contiguous axial images were obtained from the base of the skull through the vertex without intravenous contrast. RADIATION DOSE REDUCTION: This exam was performed according to the departmental dose-optimization program which includes automated exposure control, adjustment of the mA and/or kV according to patient size and/or use of iterative reconstruction technique. COMPARISON:  CT head dated August 16, 2021 FINDINGS: Brain: No evidence of acute infarction, hemorrhage, hydrocephalus, extra-axial collection or mass lesion/mass effect. Cerebral atrophy and chronic microvascular ischemic changes of the white matter, unchanged Vascular: No hyperdense vessel or unexpected calcification. Skull: Normal. Negative for fracture or focal lesion. Sinuses/Orbits: Bilateral cataract surgery.  No acute abnormality Other: None. IMPRESSION: 1.  No acute  intracranial abnormality. 2. Cerebral atrophy and chronic microvascular ischemic changes of the white matter, unchanged. Electronically Signed   By: Keane Police D.O.   On: 10/01/2021 15:26   CT Chest Wo Contrast  Result Date: 10/01/2021 CLINICAL DATA:  Respiratory illness, nondiagnostic x-ray. EXAM: CT CHEST WITHOUT CONTRAST TECHNIQUE: Multidetector CT imaging of  the chest was performed following the standard protocol without IV contrast. RADIATION DOSE REDUCTION: This exam was performed according to the departmental dose-optimization program which includes automated exposure control, adjustment of the mA and/or kV according to patient size and/or use of iterative reconstruction technique. COMPARISON:  Chest radiographs today, 09/27/2021 and 05/27/2021. Abdominal MRI 02/17/2017. FINDINGS: Cardiovascular: There is diffuse atherosclerosis of the aorta, great vessels and coronary arteries status post median sternotomy and CABG. Right subclavian pacemaker leads extend into the right atrium and right ventricle. The heart is enlarged. No significant pericardial fluid. Mediastinum/Nodes: There are no enlarged mediastinal, hilar or axillary lymph nodes.There are scattered small calcified mediastinal and right hilar lymph nodes. Evidence of previous right thyroid lobectomy. 1.1 cm low-density nodule in the left thyroid on image 19/2; No followup recommended.(Ref: J Am Coll Radiol. 2015 Feb;12(2): 143-50). Diffuse calcifications of the tracheobronchial tree. No significant esophageal abnormalities. Lungs/Pleura: No pleural effusion or pneumothorax. Pulmonary assessment mildly limited by breathing artifact. There is mild central airway thickening with scattered ground-glass opacities which could reflect edema, inflammation or scarring. There are scattered calcified granulomas. No confluent airspace opacity or suspicious pulmonary nodule. Upper abdomen: No significant findings are seen within the visualized upper abdomen.  Diffuse aortic and branch vessel atherosclerosis. Musculoskeletal/Chest wall: There is no chest wall mass or suspicious osseous finding. Previous L1 spinal augmentation and median sternotomy. Degenerative changes at the sternoclavicular joints bilaterally. IMPRESSION: 1. Nonspecific patchy ground-glass opacities in both lungs which could reflect mild edema, inflammation or scarring. Suggest radiographic follow-up. 2. No confluent airspace opacity or significant pleural effusion. 3. Cardiomegaly post CABG. Coronary and Aortic Atherosclerosis (ICD10-I70.0). 4. Sequela of prior granulomatous disease. Electronically Signed   By: Richardean Sale M.D.   On: 10/01/2021 17:31    Procedures .Critical Care Performed by: Kateri Plummer, PA-C Authorized by: Kateri Plummer, PA-C   Critical care provider statement:    Critical care time (minutes):  30   Critical care was necessary to treat or prevent imminent or life-threatening deterioration of the following conditions:  Sepsis   Critical care was time spent personally by me on the following activities:  Development of treatment plan with patient or surrogate, discussions with consultants, evaluation of patient's response to treatment, examination of patient, ordering and review of laboratory studies, ordering and review of radiographic studies, ordering and performing treatments and interventions, pulse oximetry, re-evaluation of patient's condition and review of old charts    Medications Ordered in ED Medications  levofloxacin (LEVAQUIN) IVPB 750 mg (0 mg Intravenous Stopped 10/01/21 1842)  sodium chloride 0.9 % bolus 1,000 mL (0 mLs Intravenous Stopped 10/01/21 1830)  sodium zirconium cyclosilicate (LOKELMA) packet 10 g (10 g Oral Given 10/01/21 1849)    ED Course/ Medical Decision Making/ A&P                           Medical Decision Making Amount and/or Complexity of Data Reviewed Labs: ordered. Radiology: ordered.   This patient presents to  the ED for concern of shortness of breath and confusion, this involves an extensive number of treatment options, and is a complaint that carries with it a high risk of complications and morbidity. The emergent differential diagnosis prior to evaluation includes, but is not limited to,  Cardiac (heart failure, pericardial effusion/tamponade, arrhythmias, ischemia), Respiratory (COPD, asthma, bronchitis, pneumonia, pneumothorax, pulmonary HTN, pulmonary embolism), Hematologic (anemia), Neuromuscular (ALS, Guillain-Barre),   Drug overdose (opioids, alcohol, sedatives, antipsychotics, withdrawal); Metabolic (hypoxia,  hypoglycemia, hyperglycemia, encephalopathy, lactic acidosis, DKA/HHS); Infectious (meningitis, sepsis, UTI, pneumonia); Structural (brain tumor, subdural hematoma, hydrocephalus); Vascular (stroke, subarachnoid hemorrhage, coronary ischemia, hypertensive encephalopathy, thrombotic thrombocytopenic purpura, disseminated intravascular coagulation); Psychiatric (schizophrenia, depression, dementia); Environmental (hypothermia, heat stroke); Seizure.   This is not an exhaustive differential.   Past Medical History / Co-morbidities / Social History: Hypertension, paroxysmal atrial fibrillation s/p pacemaker, hyperlipidemia, CKD, type 2 diabetes  Physical Exam: Physical exam performed. The pertinent findings include: Patient is afebrile, not tachycardic, not hypoxic, no acute distress.  Lung sounds are clear to auscultation all fields.  Patient has some mild tenderness to palpation of the left lateral chest.  Abdomen is soft, nontender.  Lab Tests: I ordered, and personally interpreted labs.  The pertinent results include: Leukocytosis of 23,300.  Hemoglobin stable.  Hyperkalemia of 6.0.  Await creatinine to 2.46, baseline of 1.6 a month ago.  Transaminitis with AST 649, ALT 495, Alk phos 293. Bilirubin 1.9.  Urinalysis negative for infection.  Lactic acid 1.4.  Initial troponin 15, delta troponin  13.  Negative flu and COVID.  Blood cultures pending.   Imaging Studies: I ordered imaging studies including CT head, chest x-ray, CT chest. I independently visualized and interpreted imaging which showed no acute intracranial abnormalities.  Chest x-ray was negative for acute cardiopulmonary abnormalities, but did look concerning for possible left-sided effusion.  CT chest showed diffuse bilateral nonspecific groundglass patchy opacities concerning for edema or inflammation.  I agree with the radiologist interpretation.   Cardiac Monitoring:  The patient was maintained on a cardiac monitor.  My attending physician Dr. Langston Masker viewed and interpreted the cardiac monitored which showed an underlying rhythm of: atrial paced rhythm.  Agree with this interpretation.   Medications: I ordered medication including IV fluids, empiric antibiotics, Lokelma for acute kidney injury, suspected pneumonia, hyperkalemia.  I have reviewed the patients home medicines and have made adjustments as needed.  Consultations Obtained: I requested consultation with the hospitalist Dr. Flossie Buffy,  and discussed lab and imaging findings as well as pertinent plan - they recommend: medical admission to [facility].   Disposition: After consideration of the diagnostic results and the patients response to treatment, I feel that patient is requiring admission and inpatient treatment for her symptoms. The patient appears reasonably stabilized for admission considering the current resources, flow, and capabilities available in the ED at this time, and I doubt any other [facility] requiring further screening and/or treatment in the ED prior to admission.    Final Clinical Impression(s) / ED Diagnoses Final diagnoses:  Altered mental status, unspecified altered mental status type  Leukocytosis, unspecified type  Acute kidney injury ([place])  Transaminitis    Rx / DC Orders ED Discharge Orders     None      Portions of this report may have  been transcribed using voice recognition software. Every effort was made to ensure accuracy; however, inadvertent computerized transcription errors may be present.    Estill Cotta 10/01/21 1934

## 2021-10-01 NOTE — ED Triage Notes (Addendum)
Per pt and daughter pt with flu like sx including SOB x 2 days-also c/o per daughter that pt "slid out of the bed onto the floor" x today-c/o HA and back pain-NAD-to triage in her own w/c-when asked travel screening-daughter states pt had covid test here 3/4 for c/o SOB  ?

## 2021-10-01 NOTE — Progress Notes (Signed)
Pharmacy Antibiotic Note ? ?Daisy Pearson is a 88 y.o. female admitted on 10/01/2021 with pneumonia.  Pharmacy has been consulted for Levaquin dosing. WBC elevated. Noted renal dysfunction.  ? ?Plan: ?Levaquin 500 mg IV q48h ?Trend WBC, temp, renal function  ?F/U infectious work-up ? ?  ? ?Temp (24hrs), Avg:98.2 ?F (36.8 ?C), Min:97.6 ?F (36.4 ?C), Max:98.9 ?F (37.2 ?C) ? ?Recent Labs  ?Lab 09/27/21 ?1325 09/27/21 ?1332 10/01/21 ?1539 10/01/21 ?1649  ?WBC 7.0  --  23.3*  --   ?CREATININE 2.53*  --  2.46*  --   ?LATICACIDVEN  --  1.2  --  1.4  ?  ?Estimated Creatinine Clearance: 18.1 mL/min (A) (by C-G formula based on SCr of 2.46 mg/dL (H)).   ? ?Allergies  ?Allergen Reactions  ? Esomeprazole Sodium Swelling  ? Penicillins Anaphylaxis and Rash  ?  Has patient had a PCN reaction causing immediate rash, facial/tongue/throat swelling, SOB or lightheadedness with hypotension: Yes ?Has patient had a PCN reaction causing severe rash involving mucus membranes or skin necrosis: No ?Has patient had a PCN reaction that required hospitalization: No ?Has patient had a PCN reaction occurring within the last 10 years: No ?If all of the above answers are "NO", then may proceed with Cephalosporin use.  ? Bactrim [Sulfamethoxazole-Trimethoprim] Nausea And Vomiting  ? Clindamycin/Lincomycin Hives  ?   ?  ? Hydrocodone Other (See Comments)  ?  Makes her crazy  ? Nsaids Hives and Other (See Comments)  ?  kidney failure  ? Valsartan Swelling  ? Hydrocodone-Acetaminophen Anxiety  ?  Other reaction(s): Confusion (intolerance), Malaise (intolerance)  ? Prilosec [Omeprazole] Swelling and Rash  ? ? ?Narda Bonds, PharmD, BCPS ?Clinical Pharmacist ?Phone: [phone] ? ? ?

## 2021-10-01 NOTE — Progress Notes (Signed)
Plan of Care Note for accepted transfer ? ? ?Patient: Daisy Pearson MRN: [id number]   [place]: 10/01/2021 ? ?Facility requesting transfer: [facility] ED ?Requesting Provider: Merri Brunette, PA-C  ?Reason for transfer: pneumonia, abnormal LFTs and AKI  ?Facility course:  ?88 yo with pacemaker, chronic atrial fibrillation on Coumadin, CVA, HFpEF, OSA Bipap, DM, CKD 3 who presents with general malaise and confusion per family.  ?No specific symptoms other than left lateral chest pain. CT chest showed non-specific groundglass opacity with negative flu and COVID PCR. ?Has leukocytosis of 23.3 K, hyperkalemia of 6, creatinine elevated to 2.46 from 1.65.  LFTs also notably elevated with AST of 649, ALT of 495, total bili 1.9 but no abdominal findings on exam.  She previously had nausea and vomiting but this has resolved.  However, she did recently finished a course of Bactrim for lower extremity cellulitis. ? ?She was started on Levaquin and given fluids and Lokelma in ED. Admit for presumed pneumonia, AKI and evaluation of abnormal LFTs.  ? ?Plan of care: ?The patient is accepted for admission to [place]  unit, at [facility]. ?ED PA to continue management while pt remains in ED.  ? ?Author: Orene Desanctis, DO ?10/01/2021 ? ?Check [email or web address] for on-call coverage. ? ?Nursing staff, Please call [place] number on Amion as soon as patient's arrival, so appropriate admitting provider can evaluate the pt. ?

## 2021-10-01 NOTE — ED Notes (Signed)
[facility] at bedside 

## 2021-10-02 ENCOUNTER — Observation Stay (HOSPITAL_COMMUNITY): Payer: Medicare Other

## 2021-10-02 DIAGNOSIS — E669 Obesity, unspecified: Secondary | ICD-10-CM

## 2021-10-02 DIAGNOSIS — E875 Hyperkalemia: Secondary | ICD-10-CM | POA: Diagnosis present

## 2021-10-02 DIAGNOSIS — Z7901 Long term (current) use of anticoagulants: Secondary | ICD-10-CM | POA: Diagnosis not present

## 2021-10-02 DIAGNOSIS — N170 Acute kidney failure with tubular necrosis: Secondary | ICD-10-CM

## 2021-10-02 DIAGNOSIS — J189 Pneumonia, unspecified organism: Secondary | ICD-10-CM | POA: Diagnosis present

## 2021-10-02 DIAGNOSIS — N179 Acute kidney failure, unspecified: Secondary | ICD-10-CM | POA: Diagnosis present

## 2021-10-02 DIAGNOSIS — G9341 Metabolic encephalopathy: Secondary | ICD-10-CM | POA: Diagnosis present

## 2021-10-02 DIAGNOSIS — Z79899 Other long term (current) drug therapy: Secondary | ICD-10-CM | POA: Diagnosis not present

## 2021-10-02 DIAGNOSIS — Z6834 Body mass index (BMI) 34.0-34.9, adult: Secondary | ICD-10-CM | POA: Diagnosis not present

## 2021-10-02 DIAGNOSIS — I5032 Chronic diastolic (congestive) heart failure: Secondary | ICD-10-CM | POA: Diagnosis present

## 2021-10-02 DIAGNOSIS — I482 Chronic atrial fibrillation, unspecified: Secondary | ICD-10-CM | POA: Diagnosis present

## 2021-10-02 DIAGNOSIS — I130 Hypertensive heart and chronic kidney disease with heart failure and stage 1 through stage 4 chronic kidney disease, or unspecified chronic kidney disease: Secondary | ICD-10-CM | POA: Diagnosis present

## 2021-10-02 DIAGNOSIS — E785 Hyperlipidemia, unspecified: Secondary | ICD-10-CM | POA: Diagnosis present

## 2021-10-02 DIAGNOSIS — T368X5A Adverse effect of other systemic antibiotics, initial encounter: Secondary | ICD-10-CM | POA: Diagnosis present

## 2021-10-02 DIAGNOSIS — R112 Nausea with vomiting, unspecified: Secondary | ICD-10-CM | POA: Diagnosis present

## 2021-10-02 DIAGNOSIS — N1832 Chronic kidney disease, stage 3b: Secondary | ICD-10-CM | POA: Diagnosis present

## 2021-10-02 DIAGNOSIS — Z794 Long term (current) use of insulin: Secondary | ICD-10-CM | POA: Diagnosis not present

## 2021-10-02 DIAGNOSIS — Z9049 Acquired absence of other specified parts of digestive tract: Secondary | ICD-10-CM | POA: Diagnosis not present

## 2021-10-02 DIAGNOSIS — A419 Sepsis, unspecified organism: Secondary | ICD-10-CM | POA: Diagnosis present

## 2021-10-02 DIAGNOSIS — E860 Dehydration: Secondary | ICD-10-CM | POA: Diagnosis present

## 2021-10-02 DIAGNOSIS — I495 Sick sinus syndrome: Secondary | ICD-10-CM | POA: Diagnosis present

## 2021-10-02 DIAGNOSIS — Z951 Presence of aortocoronary bypass graft: Secondary | ICD-10-CM | POA: Diagnosis not present

## 2021-10-02 DIAGNOSIS — I480 Paroxysmal atrial fibrillation: Secondary | ICD-10-CM | POA: Diagnosis present

## 2021-10-02 DIAGNOSIS — R4182 Altered mental status, unspecified: Secondary | ICD-10-CM | POA: Diagnosis not present

## 2021-10-02 DIAGNOSIS — Z20822 Contact with and (suspected) exposure to covid-19: Secondary | ICD-10-CM | POA: Diagnosis present

## 2021-10-02 DIAGNOSIS — R7401 Elevation of levels of liver transaminase levels: Secondary | ICD-10-CM | POA: Diagnosis present

## 2021-10-02 DIAGNOSIS — Z950 Presence of cardiac pacemaker: Secondary | ICD-10-CM | POA: Diagnosis not present

## 2021-10-02 DIAGNOSIS — Z952 Presence of prosthetic heart valve: Secondary | ICD-10-CM | POA: Diagnosis not present

## 2021-10-02 DIAGNOSIS — W06XXXA Fall from bed, initial encounter: Secondary | ICD-10-CM | POA: Diagnosis present

## 2021-10-02 DIAGNOSIS — E1122 Type 2 diabetes mellitus with diabetic chronic kidney disease: Secondary | ICD-10-CM | POA: Diagnosis present

## 2021-10-02 LAB — COMPREHENSIVE METABOLIC PANEL
ALT: 356 U/L — ABNORMAL HIGH (ref 0–44)
AST: 391 U/L — ABNORMAL HIGH (ref 15–41)
Albumin: 3.1 g/dL — ABNORMAL LOW (ref 3.5–5.0)
Alkaline Phosphatase: 228 U/L — ABNORMAL HIGH (ref 38–126)
Anion gap: 9 (ref 5–15)
BUN: 38 mg/dL — ABNORMAL HIGH (ref 8–23)
CO2: 20 mmol/L — ABNORMAL LOW (ref 22–32)
Calcium: 9 mg/dL (ref 8.9–10.3)
Chloride: 106 mmol/L (ref 98–111)
Creatinine, Ser: 2.44 mg/dL — ABNORMAL HIGH (ref 0.44–1.00)
GFR, Estimated: 19 mL/min — ABNORMAL LOW (ref >60)
Glucose, Bld: 107 mg/dL — ABNORMAL HIGH (ref 70–99)
Potassium: 5.1 mmol/L (ref 3.5–5.1)
Sodium: 135 mmol/L (ref 135–145)
Total Bilirubin: 1.4 mg/dL — ABNORMAL HIGH (ref 0.3–1.2)
Total Protein: 5.9 g/dL — ABNORMAL LOW (ref 6.5–8.1)

## 2021-10-02 LAB — CBC WITH DIFFERENTIAL/PLATELET
Abs Immature Granulocytes: 0.15 10*3/uL — ABNORMAL HIGH (ref 0.00–0.07)
Basophils Absolute: 0 10*3/uL (ref 0.0–0.1)
Basophils Relative: 0 %
Eosinophils Absolute: 0 10*3/uL (ref 0.0–0.5)
Eosinophils Relative: 0 %
HCT: 32 % — ABNORMAL LOW (ref 36.0–46.0)
Hemoglobin: 10.3 g/dL — ABNORMAL LOW (ref 12.0–15.0)
Immature Granulocytes: 1 %
Lymphocytes Relative: 5 %
Lymphs Abs: 1.1 10*3/uL (ref 0.7–4.0)
MCH: 29.1 pg (ref 26.0–34.0)
MCHC: 32.2 g/dL (ref 30.0–36.0)
MCV: 90.4 fL (ref 80.0–100.0)
Monocytes Absolute: 0.9 10*3/uL (ref 0.1–1.0)
Monocytes Relative: 4 %
Neutro Abs: 18.6 10*3/uL — ABNORMAL HIGH (ref 1.7–7.7)
Neutrophils Relative %: 90 %
Platelets: 193 10*3/uL (ref 150–400)
RBC: 3.54 MIL/uL — ABNORMAL LOW (ref 3.87–5.11)
RDW: 15.8 % — ABNORMAL HIGH (ref 11.5–15.5)
WBC: 20.8 10*3/uL — ABNORMAL HIGH (ref 4.0–10.5)
nRBC: 0 % (ref 0.0–0.2)

## 2021-10-02 LAB — CK: Total CK: 89 U/L (ref 38–234)

## 2021-10-02 LAB — PROTIME-INR
INR: 2.5 — ABNORMAL HIGH (ref 0.8–1.2)
Prothrombin Time: 27.4 seconds — ABNORMAL HIGH (ref 11.4–15.2)

## 2021-10-02 LAB — TSH: TSH: 0.712 u[IU]/mL (ref 0.350–4.500)

## 2021-10-02 LAB — BILIRUBIN, DIRECT: Bilirubin, Direct: 0.7 mg/dL — ABNORMAL HIGH (ref 0.0–0.2)

## 2021-10-02 LAB — GLUCOSE, CAPILLARY
Glucose-Capillary: 128 mg/dL — ABNORMAL HIGH (ref 70–99)
Glucose-Capillary: 224 mg/dL — ABNORMAL HIGH (ref 70–99)
Glucose-Capillary: 167 mg/dL — ABNORMAL HIGH (ref 70–99)
Glucose-Capillary: 231 mg/dL — ABNORMAL HIGH (ref 70–99)

## 2021-10-02 LAB — BLOOD GAS, VENOUS
Acid-base deficit: 2.4 mmol/L — ABNORMAL HIGH (ref 0.0–2.0)
Bicarbonate: 23.2 mmol/L (ref 20.0–28.0)
Drawn by: 50907
O2 Saturation: 29 %
Patient temperature: 36.1
pCO2, Ven: 40 mmHg — ABNORMAL LOW (ref 44–60)
pH, Ven: 7.36 (ref 7.25–7.43)
pO2, Ven: <31 mmHg — CL (ref 32–45)

## 2021-10-02 LAB — GAMMA GT: GGT: 197 U/L — ABNORMAL HIGH (ref 7–50)

## 2021-10-02 LAB — AMMONIA: Ammonia: 18 umol/L (ref 9–35)

## 2021-10-02 LAB — PROCALCITONIN: Procalcitonin: 17.48 ng/mL

## 2021-10-02 LAB — MAGNESIUM: Magnesium: 1.9 mg/dL (ref 1.7–2.4)

## 2021-10-02 MED ORDER — INSULIN DETEMIR 100 UNIT/ML ~~LOC~~ SOLN
15.0000 [IU] | Freq: Every day | SUBCUTANEOUS | Status: DC
  Administered 2021-10-02: 22:00:00 15 [IU] via SUBCUTANEOUS
  Filled 2021-10-02 (×2): qty 0.15

## 2021-10-02 MED ORDER — BUPRENORPHINE HCL 600 MCG BU FILM
1.0000 | ORAL_FILM | Freq: Two times a day (BID) | BUCCAL | Status: DC
  Administered 2021-10-02 – 2021-10-03 (×4): 1 via BUCCAL

## 2021-10-02 MED ORDER — DABIGATRAN ETEXILATE MESYLATE 75 MG PO CAPS
75.0000 mg | ORAL_CAPSULE | Freq: Two times a day (BID) | ORAL | Status: DC
  Administered 2021-10-02 – 2021-10-03 (×3): 75 mg via ORAL
  Filled 2021-10-02 (×4): qty 1

## 2021-10-02 MED ORDER — LACTATED RINGERS IV SOLN: INTRAVENOUS | Status: AC

## 2021-10-02 MED ORDER — METOPROLOL TARTRATE 25 MG PO TABS
25.0000 mg | ORAL_TABLET | Freq: Two times a day (BID) | ORAL | Status: DC
  Administered 2021-10-02 – 2021-10-03 (×4): 25 mg via ORAL
  Filled 2021-10-02 (×4): qty 1

## 2021-10-02 NOTE — Plan of Care (Signed)
?  Problem: Education: ?Goal: Knowledge of General Education information will improve ?Description: Including pain rating scale, medication(s)/side effects and non-pharmacologic comfort measures ?10/02/2021 0800 by Nancie Neas, RN ?Outcome: Progressing ?10/02/2021 0800 by Nancie Neas, RN ?Outcome: Progressing ?  ?Problem: Clinical Measurements: ?Goal: Ability to maintain clinical measurements within normal limits will improve ?Outcome: Progressing ?  ?Problem: Safety: ?Goal: Ability to remain free from injury will improve ?Outcome: Progressing ?  ?Problem: Skin Integrity: ?Goal: Risk for impaired skin integrity will decrease ?Outcome: Progressing ?  ?

## 2021-10-02 NOTE — Assessment & Plan Note (Addendum)
-   2D echo with normal EF, currently appears to be euvolemic.  Was dehydrated at the time of admission due to recent nausea vomiting, poor p.o. intake.  ?-Patient is only Lasix as needed at home, hold until follow-up with PCP.  ?- 2D echo on 06/24/2021 had shown EF of 60 to 61%, normal systolic function ?

## 2021-10-02 NOTE — Assessment & Plan Note (Addendum)
-  Likely due to statin, Bactrim/doxycycline use, SIRS.  ?- Abdominal ultrasound showed previous cholecystectomy, no acute cholecystitis, no biliary dilatation ?-Continue to hold statins, patient received IV fluid hydration. ?-CK level normal, LFTs trending down, AST 153, ALT 210, alk phos 97 at the time of discharge. ?

## 2021-10-02 NOTE — Assessment & Plan Note (Addendum)
-   Continue outpatient insulin regimen ?

## 2021-10-02 NOTE — Assessment & Plan Note (Addendum)
-   Hold enalapril due to AKI, continue Lopressor ?

## 2021-10-02 NOTE — Progress Notes (Signed)
Lab called for critical result of venous gas pO2 less than 31. Messaged Dr. Hulen Skains via Shea Evans. ?

## 2021-10-02 NOTE — Progress Notes (Signed)
Received patient awake accompanied by [facility] staff and her daughter via stretcher. Handbook given, explained rights and responsibilities and visitors guidelines. ?

## 2021-10-02 NOTE — Assessment & Plan Note (Addendum)
-  Hold statin in the setting of acute transaminitis  ?  ?

## 2021-10-02 NOTE — Progress Notes (Addendum)
[facility]                                                                               Daisy Pearson, is a 88 y.o. female, DOB - [birth year], XBM:[id number] Admit date - 10/01/2021    Outpatient Primary MD for the patient is Daisy Pearson, Cleone Slim., MD  LOS - 0  days    Brief summary   Patient is a 88 year old female with history of IDDM, paroxysmal atrial fibrillation, pacemaker, chronic anticoagulated with Pradaxa, chronic diastolic CHF, CKD stage IIIb, presented with acute metabolic encephalopathy.  Patient had reported 2 to 3 days of progressive confusion.  She was recently diagnosed with cellulitis in her lower extremity and had completed course of Bactrim and doxycycline with resolution.  Over the past few days of the antibiotic course, patient also had nausea and vomiting, 2-3 episodes.  No hematemesis.  Baseline creatinine around 1.6  Elevated LFTs,.  Potassium 6.0.  Bicarbonate 21.  Received Lokelma 10 mg p.o. x1, IV fluids, Levaquin.  Patient was admitted for further work-up.   Assessment & Plan    Assessment and Plan: * Acute metabolic encephalopathy - Likely in the setting of dehydration, recent nausea vomiting, decreased p.o. intake, AKI, acute transaminitis.  -No focal neurological deficits, CT head showed no evidence of acute intracranial process.  Ammonia level 18. -Currently fully alert and oriented, at baseline, confirmed with daughter at the bedside     SIRS with pneumonitis - Patient met SIRS criteria.  Severe sepsis ruled out.  No hypotension, lactic acidosis.  AKI likely due to Bactrim/ACE inhibitor use, poor p.o. intake, nausea vomiting and dehydration.  -Continue IV Levaquin and Flagyl -CT chest showed groundglass opacities with a leukocytosis and elevated procalcitonin.  Possibly had hypersensitivity reaction to Bactrim or doxycycline.  Currently no wheezing or rales.  COVID-negative. -Follow CBC, procalcitonin in a.m. follow blood  cultures. -Daughter requested right foot x-ray to rule out osteomyelitis (had right heel wound with pain 2 weeks ago, appears to have healed)  Acute transaminitis - Likely due to statin, Bactrim/doxycycline use, SIRS.  Will check CK level -Continue IV fluid hydration.  Abdominal ultrasound showed previous cholecystectomy, no acute cholecystitis, no biliary dilatation -Hold statin.  Continue IV fluid hydration  Acute renal failure superimposed on stage 3b chronic kidney disease ([place]) -Likely multifactorial due to dehydration, recent Bactrim use, ACE inhibitor  - presented with creatinine of 2.46 (creatinine was 2.5 on 3/4, 1.65 on 08/16/2021) -Placed on Ringer lactate at 100cc/ hour  Hyperkalemia Likely secondary to AKI, enalapril, Bactrim.  Potassium 5.4 at the time of admission -Lokelma x1, potassium improved to 5.1        PAF (paroxysmal atrial fibrillation) ([place]) - Heart rate currently controlled, patient is on Pradaxa, will continue.  (She is not on Xarelto, confirmed with the patient and daughter) -Continue amiodarone, beta-blocker   Essential hypertension - Hold enalapril due to AKI, continue Lopressor  Type 2 diabetes mellitus (HCC) - CBG stable -Continue Levemir 15 units at bedtime, NovoLog sliding scale insulin sensitive -Follow hemoglobin A1c  Obesity (BMI 30-39.9) Body mass index is 34.32 kg/m.  Chronic diastolic CHF (congestive heart failure) ([place]) - 2D echo  with normal EF, currently appears to be euvolemic.  Was dehydrated at the time of admission due to recent nausea vomiting, poor p.o. intake.  -Patient is only Lasix as needed at home.  2D echo on 06/24/2021 had shown EF of 60 to 16%, normal systolic function  Hyperlipidemia -Hold statin in the setting of acute transaminitis      Code Status: Full CODE STATUS DVT Prophylaxis:  SCDs Start: 10/01/21 2212 dabigatran (PRADAXA) capsule 75 mg   Level of Care: Level of care: Telemetry Medical Family  Communication: Updated patient's daughter at the bedside and another daughter on the phone  Disposition Plan:     Remains inpatient appropriate: Creatinine not close to baseline yet.  Will order PT evaluation.  Hopefully DC home in a.m. if creatinine improving  Procedures:  None  Consultants:   None  Antimicrobials:   Anti-infectives (From admission, onward)    Start     Dose/Rate Route Frequency Ordered Stop   10/03/21 1800  levofloxacin (LEVAQUIN) IVPB 500 mg        500 mg 100 mL/hr over 60 Minutes Intravenous Every 48 hours 10/01/21 2350     10/01/21 2345  metroNIDAZOLE (FLAGYL) IVPB 500 mg        500 mg 100 mL/hr over 60 Minutes Intravenous 2 times daily 10/01/21 2328     10/01/21 1645  levofloxacin (LEVAQUIN) IVPB 750 mg        750 mg 100 mL/hr over 90 Minutes Intravenous  Once 10/01/21 1637 10/01/21 1842        Medications   amiodarone  200 mg Oral Daily   Buprenorphine HCl  1 Film Buccal BID   dabigatran  75 mg Oral BID   insulin aspart  0-9 Units Subcutaneous TID WC   insulin detemir  15 Units Subcutaneous QHS   metoprolol tartrate  25 mg Oral BID     Subjective:   Daisy Pearson was seen and examined today.  Much more alert and oriented, close to her baseline status.  Per patient she was somewhat short of breath at the time of admission, now has improved. No fevers or chills.  Patient denies dizziness, chest pain, shortness of breath, abdominal pain, N/V/D/C.   Objective:   Vitals:   10/01/21 2325 10/02/21 0318 10/02/21 0751 10/02/21 1136  BP: (!) 122/53 112/65 (!) 123/55 (!) 137/56  Pulse: 78 (!) 58 60 61  Resp: 19  17 16   Temp: 98.2 F (36.8 C) 98.4 F (36.9 C) 98.5 F (36.9 C) 97.8 F (36.6 C)  TempSrc: Oral Oral Oral Oral  SpO2: 96% 100% 100% 99%  Weight:      Height:        Intake/Output Summary (Last 24 hours) at 10/02/2021 1329 Last data filed at 10/02/2021 1000 Gross per 24 hour  Intake 1378.08 ml  Output --  Net 1378.08 ml   Filed  Weights   10/01/21 2159  Weight: 90.7 kg     Exam General: Alert and oriented x 3, NAD Cardiovascular: S1 S2 auscultated,  RRR Respiratory: Clear to auscultation bilaterally, no wheezing. Gastrointestinal: Soft, nontender, nondistended, + bowel sounds Ext: no pedal edema bilaterally Neuro: no new deficits Skin: No rashes Psych: Normal affect and demeanor, alert and oriented x3    Data Reviewed:  I have personally reviewed following labs    CBC Lab Results  Component Value Date   WBC 20.8 (H) 10/02/2021   RBC 3.54 (L) 10/02/2021   HGB 10.3 (L) 10/02/2021   HCT  32.0 (L) 10/02/2021   MCV 90.4 10/02/2021   MCH 29.1 10/02/2021   PLT 193 10/02/2021   MCHC 32.2 10/02/2021   RDW 15.8 (H) 10/02/2021   LYMPHSABS 1.1 10/02/2021   MONOABS 0.9 10/02/2021   EOSABS 0.0 10/02/2021   BASOSABS 0.0 60/10/5995     Last metabolic panel Lab Results  Component Value Date   NA 135 10/02/2021   K 5.1 10/02/2021   CL 106 10/02/2021   CO2 20 (L) 10/02/2021   BUN 38 (H) 10/02/2021   CREATININE 2.44 (H) 10/02/2021   GLUCOSE 107 (H) 10/02/2021   GFRNONAA 19 (L) 10/02/2021   GFRAA 36 (L) 08/20/2019   CALCIUM 9.0 10/02/2021   PROT 5.9 (L) 10/02/2021   ALBUMIN 3.1 (L) 10/02/2021   BILITOT 1.4 (H) 10/02/2021   ALKPHOS 228 (H) 10/02/2021   AST 391 (H) 10/02/2021   ALT 356 (H) 10/02/2021   ANIONGAP 9 10/02/2021    CBG (last 3)  Recent Labs    10/01/21 2323 10/02/21 0605 10/02/21 1136  GLUCAP 119* 128* 224*      Coagulation Profile: Recent Labs  Lab 10/02/21 0105  INR 2.5*     Radiology Studies: I have personally reviewed the imaging studies  DG Chest 2 View  Result Date: 10/01/2021 IMPRESSION: No active cardiopulmonary disease. Electronically Signed   By: Keane Police D.O.   On: 10/01/2021 16:21   CT Head Wo Contrast  Result Date: 10/01/2021  IMPRESSION: 1.  No acute intracranial abnormality. 2. Cerebral atrophy and chronic microvascular ischemic changes of the white  matter, unchanged. Electronically Signed   By: Keane Police D.O.   On: 10/01/2021 15:26   CT Chest Wo Contrast  Result Date: 10/01/2021 IMPRESSION: 1. Nonspecific patchy ground-glass opacities in both lungs which could reflect mild edema, inflammation or scarring. Suggest radiographic follow-up. 2. No confluent airspace opacity or significant pleural effusion. 3. Cardiomegaly post CABG. Coronary and Aortic Atherosclerosis (ICD10-I70.0). 4. Sequela of prior granulomatous disease. Electronically Signed   By: Richardean Sale M.D.   On: 10/01/2021 17:31   DG Foot Complete Right  Result Date: 10/02/2021 CLINICAL DATA:  Heel ulcer. EXAM: RIGHT FOOT COMPLETE - 3+ VIEW COMPARISON:  None. FINDINGS: Soft tissue swelling is present under the calcaneus. No discrete ulceration is present. No osseous changes present. A small plantar calcaneal spur is present. IMPRESSION: 1. Soft tissue swelling under the calcaneus without discrete ulceration or underlying osseous changes. 2. Small plantar calcaneal spur. Electronically Signed   By: San Morelle M.D.   On: 10/02/2021 11:15   US ABDOMEN LIMITED RUQ (LIVER/GB)  Result Date: 10/02/2021 IMPRESSION: 1. Heterogeneous hepatic parenchyma without focal lesion. 2. Cholecystectomy without biliary dilatation. Electronically Signed   By: Keith Rake M.D.   On: 10/02/2021 00:52       Dalayza Zambrana M.D. [facility] 10/02/2021, 1:29 PM  Available via Epic secure chat 7am-7pm After 7 pm, please refer to night coverage provider listed on amion.

## 2021-10-02 NOTE — Assessment & Plan Note (Addendum)
-  Patient met SIRS criteria.  Severe sepsis ruled out.  No hypotension, lactic acidosis.  AKI likely due to Bactrim/ACE inhibitor use, poor p.o. intake, nausea vomiting and dehydration.  ?-Patient was placed on IV Levaquin and Flagyl, transitioned to oral Levaquin and Flagyl at discharge. ?-  WBC count, procalcitonin improving. ?-CT chest showed groundglass opacities with a leukocytosis and elevated procalcitonin.  Possibly had hypersensitivity reaction to Bactrim or doxycycline.    COVID-negative. ?-Right foot x-ray negative for osteomyelitis (had right heel wound with pain 2 weeks ago, appears to have healed) ?

## 2021-10-02 NOTE — Assessment & Plan Note (Addendum)
-   Continue amiodarone, beta-blocker ?-Continue Xarelto ? ?

## 2021-10-02 NOTE — Hospital Course (Signed)
Patient is a 88 year old female with history of IDDM, paroxysmal atrial fibrillation, pacemaker, chronic anticoagulated with Pradaxa, chronic diastolic CHF, CKD stage IIIb, presented with acute metabolic encephalopathy.  Patient had reported 2 to 3 days of progressive confusion.  She was recently diagnosed with cellulitis in her lower extremity and had completed course of Bactrim and doxycycline with resolution.  Over the past few days of the antibiotic course, patient also had nausea and vomiting, 2-3 episodes.  No hematemesis.  Baseline creatinine around 1.6 ? ?Elevated LFTs,.  Potassium 6.0.  Bicarbonate 21.  Received Lokelma 10 mg p.o. x1, IV fluids, Levaquin. ? ?Patient was admitted for further work-up. ?

## 2021-10-02 NOTE — Assessment & Plan Note (Addendum)
-   Likely in the setting of dehydration, recent nausea vomiting, decreased p.o. intake, AKI, acute transaminitis.  ?-No focal neurological deficits, CT head showed no evidence of acute intracranial process.  Ammonia level 18. ?-Currently fully alert and oriented, at baseline ?  ? ?

## 2021-10-02 NOTE — Assessment & Plan Note (Addendum)
Likely secondary to AKI, enalapril, Bactrim.  Potassium 5.4 at the time of admission ?-Patient received Lokelma x1 on admission, potassium improved to 4.6  ?  ? ?  ?

## 2021-10-02 NOTE — Assessment & Plan Note (Addendum)
-  Likely multifactorial due to dehydration, recent Bactrim use, ACE inhibitor  ?- presented with creatinine of 2.46 (creatinine was 2.5 on 3/4, 1.65 on 08/16/2021) ?-Placed on Ringer lactate IV fluids, creatinine improved to 1.9 at the time of discharge.  Encouraged to continue with p.o. fluids. ?

## 2021-10-02 NOTE — Assessment & Plan Note (Addendum)
Body mass index is 34.32 kg/m?Marland Kitchen ?

## 2021-10-03 DIAGNOSIS — R400 Somnolence: Secondary | ICD-10-CM

## 2021-10-03 DIAGNOSIS — N170 Acute kidney failure with tubular necrosis: Secondary | ICD-10-CM | POA: Diagnosis not present

## 2021-10-03 DIAGNOSIS — G9341 Metabolic encephalopathy: Secondary | ICD-10-CM | POA: Diagnosis not present

## 2021-10-03 DIAGNOSIS — R4182 Altered mental status, unspecified: Secondary | ICD-10-CM | POA: Diagnosis not present

## 2021-10-03 DIAGNOSIS — N179 Acute kidney failure, unspecified: Secondary | ICD-10-CM | POA: Diagnosis not present

## 2021-10-03 LAB — COMPREHENSIVE METABOLIC PANEL
ALT: 210 U/L — ABNORMAL HIGH (ref 0–44)
AST: 153 U/L — ABNORMAL HIGH (ref 15–41)
Albumin: 2.9 g/dL — ABNORMAL LOW (ref 3.5–5.0)
Alkaline Phosphatase: 181 U/L — ABNORMAL HIGH (ref 38–126)
Anion gap: 9 (ref 5–15)
BUN: 31 mg/dL — ABNORMAL HIGH (ref 8–23)
CO2: 19 mmol/L — ABNORMAL LOW (ref 22–32)
Calcium: 8.8 mg/dL — ABNORMAL LOW (ref 8.9–10.3)
Chloride: 107 mmol/L (ref 98–111)
Creatinine, Ser: 1.98 mg/dL — ABNORMAL HIGH (ref 0.44–1.00)
GFR, Estimated: 24 mL/min — ABNORMAL LOW (ref >60)
Glucose, Bld: 169 mg/dL — ABNORMAL HIGH (ref 70–99)
Potassium: 4.6 mmol/L (ref 3.5–5.1)
Sodium: 135 mmol/L (ref 135–145)
Total Bilirubin: 0.6 mg/dL (ref 0.3–1.2)
Total Protein: 5.8 g/dL — ABNORMAL LOW (ref 6.5–8.1)

## 2021-10-03 LAB — CBC
HCT: 34.3 % — ABNORMAL LOW (ref 36.0–46.0)
Hemoglobin: 10.7 g/dL — ABNORMAL LOW (ref 12.0–15.0)
MCH: 29 pg (ref 26.0–34.0)
MCHC: 31.2 g/dL (ref 30.0–36.0)
MCV: 93 fL (ref 80.0–100.0)
Platelets: 155 10*3/uL (ref 150–400)
RBC: 3.69 MIL/uL — ABNORMAL LOW (ref 3.87–5.11)
RDW: 15.7 % — ABNORMAL HIGH (ref 11.5–15.5)
WBC: 8.2 10*3/uL (ref 4.0–10.5)
nRBC: 0 % (ref 0.0–0.2)

## 2021-10-03 LAB — GLUCOSE, CAPILLARY
Glucose-Capillary: 132 mg/dL — ABNORMAL HIGH (ref 70–99)
Glucose-Capillary: 181 mg/dL — ABNORMAL HIGH (ref 70–99)

## 2021-10-03 LAB — MAGNESIUM: Magnesium: 1.8 mg/dL (ref 1.7–2.4)

## 2021-10-03 LAB — PROCALCITONIN: Procalcitonin: 6.49 ng/mL

## 2021-10-03 MED ORDER — RIVAROXABAN 15 MG PO TABS
15.0000 mg | ORAL_TABLET | Freq: Every day | ORAL | Status: DC
Start: 2021-10-03 — End: 2021-10-03

## 2021-10-03 MED ORDER — LEVOFLOXACIN 500 MG PO TABS: 500.0000 mg | ORAL_TABLET | ORAL | 0 refills | Status: AC

## 2021-10-03 MED ORDER — ENALAPRIL MALEATE 2.5 MG PO TABS: 2.5000 mg | ORAL_TABLET | Freq: Every day | ORAL | Status: AC

## 2021-10-03 MED ORDER — ATORVASTATIN CALCIUM 40 MG PO TABS: 40.0000 mg | ORAL_TABLET | Freq: Every day | ORAL | Status: AC

## 2021-10-03 MED ORDER — METRONIDAZOLE 500 MG PO TABS: 500.0000 mg | ORAL_TABLET | Freq: Two times a day (BID) | ORAL | 0 refills | Status: AC

## 2021-10-03 MED ORDER — FUROSEMIDE 20 MG PO TABS: 20.0000 mg | ORAL_TABLET | Freq: Every day | ORAL | 0 refills | Status: AC

## 2021-10-03 NOTE — Evaluation (Signed)
Physical Therapy Evaluation ?Patient Details ?Name: Daisy Pearson ?MRN: [id number] ?DOB: 1933-08-19 ?Today's Date: 10/03/2021 ? ?History of Present Illness ? Pt adm 3/8 with acute metabolic encephalopathy likely due to dehydration, AKI, acute transaminitis. PMH - CAD, cabg, avr, chf, AFib, pacer, CVA, DM, stage IV CKD  ?Clinical Impression ? Pt presents to PT with slightly unsteady gait due to illness and inactivity. Expect pt will make good progress back to baseline with mobility with assist of family at home. Ready for dc home from PT standpoint.  ?   ?   ? ?Recommendations for follow up therapy are one component of a multi-disciplinary discharge planning process, led by the attending physician.  Recommendations may be updated based on patient status, additional functional criteria and insurance authorization. ? ?Follow Up Recommendations No PT follow up ? ?  ?Assistance Recommended at Discharge Intermittent Supervision/Assistance  ?Patient can return home with the following ? A little help with walking and/or transfers;Help with stairs or ramp for entrance ? ?  ?Equipment Recommendations None recommended by PT  ?Recommendations for Other Services ?    ?  ?Functional Status Assessment Patient has had a recent decline in their functional status and/or demonstrates limited ability to make significant improvements in function in a reasonable and predictable amount of time  ? ?  ?Precautions / Restrictions Precautions ?Precautions: Fall  ? ?  ? ?Mobility ? Bed Mobility ?Overal bed mobility: Needs Assistance ?Bed Mobility: Supine to Sit ?  ?  ?Supine to sit: Min guard, HOB elevated ?  ?  ?General bed mobility comments: Incr time and effort ?  ? ?Transfers ?Overall transfer level: Needs assistance ?Equipment used: None ?Transfers: Sit to/from Stand ?Sit to Stand: Min guard ?  ?  ?  ?  ?  ?General transfer comment: Assist for safety ?  ? ?Ambulation/Gait ?Ambulation/Gait assistance: Min guard, Supervision ?Gait  Distance (Feet): 140 Feet ?Assistive device: Rolling walker (2 wheels), None ?Gait Pattern/deviations: Step-through pattern, Decreased stride length ?Gait velocity: decr ?Gait velocity interpretation: 1.31 - 2.62 ft/sec, indicative of limited community ambulator ?  ?General Gait Details: Min guard without assistive device and supervision if using walker ? ?Stairs ?  ?  ?  ?  ?  ? ?Wheelchair Mobility ?  ? ?Modified Rankin (Stroke Patients Only) ?  ? ?  ? ?Balance Overall balance assessment: Needs assistance ?Sitting-balance support: No upper extremity supported, Feet supported ?Sitting balance-Leahy Scale: Good ?  ?  ?Standing balance support: No upper extremity supported, During functional activity ?Standing balance-Leahy Scale: Fair ?  ?  ?  ?  ?  ?  ?  ?  ?  ?  ?  ?  ?   ? ? ? ?Pertinent Vitals/Pain Pain Assessment ?Pain Assessment: No/denies pain  ? ? ?Home Living Family/patient expects to be discharged to:: Private residence ?Living Arrangements: Children;Other (Comment) (grandchildren) ?Available Help at Discharge: Family;Available 24 hours/day ?Type of Home: House (Pt lives in basement apt of daughters house) ?Home Access: Stairs to enter ?Entrance Stairs-Rails: Left;Right;Can reach both ?Entrance Stairs-Number of Steps: 6 ?  ?Home Layout: One level ?Home Equipment: Conservation officer, nature (2 wheels);Shower seat;Wheelchair - manual ?   ?  ?Prior Function Prior Level of Function : Needs assist ?  ?  ?  ?Physical Assist : Mobility (physical) ?Mobility (physical): Stairs ?  ?Mobility Comments: amb without assistive device in the home uses w/c for community distances ?  ?  ? ? ?Hand Dominance  ? Dominant Hand: Right ? ?  ?  Extremity/Trunk Assessment  ? Upper Extremity Assessment ?Upper Extremity Assessment: Defer to OT evaluation ?  ? ?Lower Extremity Assessment ?Lower Extremity Assessment: Overall WFL for tasks assessed ?  ? ?   ?Communication  ? Communication: No difficulties  ?Cognition Arousal/Alertness:  Awake/alert ?Behavior During Therapy: [facility] for tasks assessed/performed ?Overall Cognitive Status: Within Functional Limits for tasks assessed ?  ?  ?  ?  ?  ?  ?  ?  ?  ?  ?  ?  ?  ?  ?  ?  ?  ?  ?  ? ?  ?General Comments   ? ?  ?Exercises    ? ?Assessment/Plan  ?  ?PT Assessment Patient does not need any further PT services  ?PT Problem List   ? ?   ?  ?PT Treatment Interventions     ? ?PT Goals (Current goals can be found in the Care Plan section)  ?Acute Rehab PT Goals ?PT Goal Formulation: All assessment and education complete, DC therapy ? ?  ?Frequency   ?  ? ? ?Co-evaluation   ?  ?  ?  ?  ? ? ?  ?AM-PAC PT "6 Clicks" Mobility  ?Outcome Measure Help needed turning from your back to your side while in a flat bed without using bedrails?: None ?Help needed moving from lying on your back to sitting on the side of a flat bed without using bedrails?: A Little ?Help needed moving to and from a bed to a chair (including a wheelchair)?: A Little ?Help needed standing up from a chair using your arms (e.g., wheelchair or bedside chair)?: A Little ?Help needed to walk in hospital room?: A Little ?Help needed climbing 3-5 steps with a railing? : A Little ?6 Click Score: 19 ? ?  ?End of Session   ?Activity Tolerance: Patient tolerated treatment well ?Patient left: in bed;with call bell/phone within reach;with bed alarm set;with family/visitor present (sitting EOB) ?Nurse Communication: Mobility status;Other (comment) (nurse tech - purewick out) ?PT Visit Diagnosis: Unsteadiness on feet (R26.81) ?  ? ?Time: 9774-1423 ?PT Time Calculation (min) (ACUTE ONLY): 16 min ? ? ?Charges:   PT Evaluation ?$PT Eval Low Complexity: 1 Low ?  ?  ?   ? ? ?[facility] PT ?Acute Rehabilitation Services ?Pager [phone] ?Office [phone] ? ? ?Shary Decamp [facility] ?10/03/2021, 9:40 AM ? ?

## 2021-10-03 NOTE — TOC Initial Note (Signed)
Transition of Care (TOC) - Initial/Assessment Note  ? ? ?Patient Details  ?Name: Daisy Pearson ?MRN: [id number] ?Date of Birth: 18-Dec-1933 ? ?Transition of Care (TOC) CM/SW Contact:    ?Verdell Carmine, RN ?Phone Number: ?10/03/2021, 12:02 PM ? ?Clinical Narrative:                 ? ?The Transition of Care Department [facility]) has reviewed patient and no TOC needs have been identified at this time. We will continue to monitor patient advancement through interdisciplinary progression rounds. If new patient transition needs arise, please place a TOC consult ? ?  ?  ? ? ?Patient Goals and CMS Choice ?  ?  ?  ? ?Expected Discharge Plan and Services ?  ?  ?  ?  ?  ?                ?  ?  ?  ?  ?  ?  ?  ?  ?  ?  ? ?Prior Living Arrangements/Services ?  ?  ?  ?       ?  ?  ?  ?  ? ?Activities of Daily Living ?Home Assistive Devices/Equipment: Wheelchair, Environmental consultant (specify type) ?ADL Screening (condition at time of admission) ?Patient's cognitive ability adequate to safely complete daily activities?: Yes ?Is the patient deaf or have difficulty hearing?: No ?Does the patient have difficulty seeing, even when wearing glasses/contacts?: No ?Does the patient have difficulty concentrating, remembering, or making decisions?: No ?Patient able to express need for assistance with ADLs?: Yes ?Does the patient have difficulty dressing or bathing?: Yes ?Independently performs ADLs?: No ?Communication: Independent ?Grooming: Independent ?Feeding: Independent ?Bathing: Needs assistance ?Toileting: Needs assistance ?In/Out Bed: Needs assistance ?Walks in Home: Needs assistance ?Does the patient have difficulty walking or climbing stairs?: No ?Weakness of Legs: Both ?Weakness of Arms/Hands: None ? ?Permission Sought/Granted ?  ?  ?   ?   ?   ?   ? ?Emotional Assessment ?  ?  ?  ?  ?  ?  ? ?Admission diagnosis:  Transaminitis [R74.01] ?Acute kidney injury ([place]) [N17.9] ?Altered mental status, unspecified altered mental status type  [R41.82] ?Leukocytosis, unspecified type [D72.829] ?AMS (altered mental status) [R41.82] ?Patient Active Problem List  ? Diagnosis Date Noted  ? Acute metabolic encephalopathy 79/08/4095  ? Hyperkalemia 10/02/2021  ? Acute transaminitis 10/02/2021  ? SIRS with pneumonitis 10/02/2021  ? Obesity (BMI 30-39.9) 10/02/2021  ? Hyperlipidemia   ? Chronic diastolic CHF (congestive heart failure) ([place])   ? AMS (altered mental status) 10/01/2021  ? Acute encephalopathy 08/18/2019  ? Acute respiratory failure due to COVID-19 [facility]) 08/18/2019  ? Acute renal failure superimposed on stage 3b chronic kidney disease ([place]) 08/18/2019  ? CAP (community acquired pneumonia) 08/17/2019  ? Type 2 diabetes mellitus ([place]) 02/16/2017  ? Acute kidney injury ([place]) 02/16/2017  ? Essential hypertension 02/16/2017  ? PAF (paroxysmal atrial fibrillation) ([place]) 02/16/2017  ? Acute gallstone pancreatitis 02/14/2017  ? ?PCP:  Karleen Hampshire., MD ?Pharmacy:   ?[facility] [address] - [place], [place] - [address] [place] [address] ?[address] ?[place] [address] ?Phone: [phone] Fax: [phone] ? ?[facility] [place] [place] [facility] Registered [facility]mark Sites ?[facility] ?[place] [address] ?Phone: [phone] Fax: [phone] ? ?[facility] [address] [place], [place]  [place] [place] ?[place] [address] ?Phone: [phone] Fax: [phone] ? ? ? ? ?Social Determinants of Health (SDOH) Interventions ?  ? ?Readmission Risk Interventions ?Readmission Risk Prevention Plan 08/20/2019  ?Transportation Screening Complete  ?PCP or Specialist Appt within 3-5 Days Complete  ?[place] or Home Care Consult Complete  ?Social Work Consult for [place] Planning/Counseling Complete  ?Palliative Care Screening Not Applicable  ?Medication Review Human resources officer) Complete  ?Some recent data might be hidden  ? ? ? ?

## 2021-10-03 NOTE — Evaluation (Signed)
Occupational Therapy Evaluation ?Patient Details ?Name: SYNTHIA FAIRBANK ?MRN: [id number] ?DOB: [birth year] ?Today's Date: 10/03/2021 ? ? ?History of Present Illness Pt adm 3/8 with acute metabolic encephalopathy likely due to dehydration, AKI, acute transaminitis. PMH - CAD, cabg, avr, chf, AFib, pacer, CVA, DM, stage IV CKD  ? ?Clinical Impression ?  ?Pt admitted for concerns listed above. PTA pt reported that she was independent with all ADL's and functional mobility. At this time pt reports that she was mildly weak, however continues to demonstrate independence with ADL's. Overall pt is safe and well balanced, has no further OT needs, and acute OT will sign off.   ?   ? ?Recommendations for follow up therapy are one component of a multi-disciplinary discharge planning process, led by the attending physician.  Recommendations may be updated based on patient status, additional functional criteria and insurance authorization.  ? ?Follow Up Recommendations ? No OT follow up  ?  ?Assistance Recommended at Discharge PRN  ?Patient can return home with the following Assistance with cooking/housework ? ?  ?Functional Status Assessment ? Patient has had a recent decline in their functional status and demonstrates the ability to make significant improvements in function in a reasonable and predictable amount of time.  ?Equipment Recommendations ? None recommended by OT  ?  ?Recommendations for Other Services   ? ? ?  ?Precautions / Restrictions Precautions ?Precautions: Fall ?Restrictions ?Weight Bearing Restrictions: No  ? ?  ? ?Mobility Bed Mobility ?Overal bed mobility: Needs Assistance ?Bed Mobility: Supine to Sit ?  ?  ?Supine to sit: Min guard, HOB elevated ?  ?  ?General bed mobility comments: Incr time and effort ?  ? ?Transfers ?Overall transfer level: Needs assistance ?Equipment used: None ?Transfers: Sit to/from Stand ?Sit to Stand: Supervision ?  ?  ?  ?  ?  ?General transfer comment: Assist for safety ?  ? ?   ?Balance Overall balance assessment: Needs assistance ?Sitting-balance support: No upper extremity supported, Feet supported ?Sitting balance-Leahy Scale: Good ?  ?  ?Standing balance support: No upper extremity supported, During functional activity ?Standing balance-Leahy Scale: Fair ?  ?  ?  ?  ?  ?  ?  ?  ?  ?  ?  ?  ?   ? ?ADL either performed or assessed with clinical judgement  ? ?ADL Overall ADL's : At baseline ?  ?  ?  ?  ?  ?  ?  ?  ?  ?  ?  ?  ?  ?  ?  ?  ?  ?  ?  ?General ADL Comments: Pt independent with all ADL's having no difficulties.  ? ? ? ?Vision Baseline Vision/History: 1 Wears glasses ?Ability to See in Adequate Light: 0 Adequate ?Patient Visual Report: No change from baseline ?Vision Assessment?: No apparent visual deficits  ?   ?Perception   ?  ?Praxis   ?  ? ?Pertinent Vitals/Pain Pain Assessment ?Pain Assessment: No/denies pain  ? ? ? ?Hand Dominance Right ?  ?Extremity/Trunk Assessment Upper Extremity Assessment ?Upper Extremity Assessment: Overall WFL for tasks assessed ?  ?Lower Extremity Assessment ?Lower Extremity Assessment: Defer to PT evaluation ?  ?Cervical / Trunk Assessment ?Cervical / Trunk Assessment: Normal ?  ?Communication Communication ?Communication: No difficulties ?  ?Cognition Arousal/Alertness: Awake/alert ?Behavior During Therapy: [facility] for tasks assessed/performed ?Overall Cognitive Status: Within Functional Limits for tasks assessed ?  ?  ?  ?  ?  ?  ?  ?  ?  ?  ?  ?  ?  ?  ?  ?  ?  ?  ?  ?  General Comments  VSS on RA ? ?  ?Exercises   ?  ?Shoulder Instructions    ? ? ?Home Living Family/patient expects to be discharged to:: Private residence ?Living Arrangements: Children;Other (Comment) (grandchildren) ?Available Help at Discharge: Family;Available 24 hours/day ?Type of Home: House (Pt lives in basement apt of daughters house) ?Home Access: Stairs to enter ?Entrance Stairs-Number of Steps: 6 ?Entrance Stairs-Rails: Left;Right;Can reach both ?Home Layout: One level ?   ?  ?Bathroom Shower/Tub: Tub/shower unit ?  ?Bathroom Toilet: Standard ?  ?  ?Home Equipment: Conservation officer, nature (2 wheels);Shower seat;Wheelchair - manual ?  ?  ?  ? ?  ?Prior Functioning/Environment Prior Level of Function : Needs assist ?  ?  ?  ?Physical Assist : Mobility (physical) ?Mobility (physical): Stairs ?  ?Mobility Comments: amb without assistive device in the home uses w/c for community distances ?ADLs Comments: Reports independence ?  ? ?  ?  ?OT Problem List: Decreased strength;Decreased activity tolerance;Impaired balance (sitting and/or standing) ?  ?   ?OT Treatment/Interventions:    ?  ?OT Goals(Current goals can be found in the care plan section) Acute Rehab OT Goals ?Patient Stated Goal: To go home ?OT Goal Formulation: With patient ?Time For Goal Achievement: 10/03/21 ?Potential to Achieve Goals: Good  ?OT Frequency:   ?  ? ?Co-evaluation   ?  ?  ?  ?  ? ?  ?AM-PAC OT "6 Clicks" Daily Activity     ?Outcome Measure Help from another person eating meals?: None ?Help from another person taking care of personal grooming?: None ?Help from another person toileting, which includes using toliet, bedpan, or urinal?: None ?Help from another person bathing (including washing, rinsing, drying)?: None ?Help from another person to put on and taking off regular upper body clothing?: None ?Help from another person to put on and taking off regular lower body clothing?: None ?6 Click Score: 24 ?  ?End of Session Nurse Communication: Mobility status ? ?Activity Tolerance: Patient tolerated treatment well ?Patient left: in bed;with call bell/phone within reach;with family/visitor present ? ?OT Visit Diagnosis: Unsteadiness on feet (R26.81);Other abnormalities of gait and mobility (R26.89);Muscle weakness (generalized) (M62.81)  ?              ?Time: 1962-2297 ?OT Time Calculation (min): 14 min ?Charges:  OT General Charges ?$OT Visit: 1 Visit ?OT Evaluation ?$OT Eval Moderate Complexity: 1 Mod ? ?Briar Witherspoon H.,  OTR/L ?Acute Rehabilitation ? ?Avett Reineck Elane Yolanda Bonine ?10/03/2021, 1:08 PM ?

## 2021-10-03 NOTE — Plan of Care (Signed)
  Problem: Education: Goal: Knowledge of General Education information will improve Description Including pain rating scale, medication(s)/side effects and non-pharmacologic comfort measures Outcome: Progressing   Problem: Clinical Measurements: Goal: Ability to maintain clinical measurements within normal limits will improve Outcome: Progressing Goal: Diagnostic test results will improve Outcome: Progressing   Problem: Pain Managment: Goal: General experience of comfort will improve Outcome: Progressing   

## 2021-10-03 NOTE — Discharge Summary (Signed)
Physician Discharge Summary   Patient: Daisy Pearson MRN: [id number] DOB: 06/08/1934  Admit date:     10/01/2021  Discharge date: 10/03/21  Discharge Physician: Thad Ranger   PCP: Laqueta Due., MD   Recommendations at discharge:   Hold enalapril, Lasix until follow-up with PCP,  follow BMET Continue Levaquin, Flagyl for 3 days to complete course  Discharge Diagnoses:    Acute metabolic encephalopathy-resolved   Acute renal failure superimposed on stage 3b chronic kidney disease (HCC)   Acute transaminitis   SIRS with pneumonitis   Type 2 diabetes mellitus (HCC)   Essential hypertension   PAF (paroxysmal atrial fibrillation) (HCC)   Hyperkalemia   Hyperlipidemia   Chronic diastolic CHF (congestive heart failure) (HCC)   Obesity (BMI 30-39.9)   Hospital Course: Patient is a 88 year old female with history of IDDM, paroxysmal atrial fibrillation, pacemaker, chronic anticoagulated with Pradaxa, chronic diastolic CHF, CKD stage IIIb, presented with acute metabolic encephalopathy.  Patient had reported 2 to 3 days of progressive confusion.  She was recently diagnosed with cellulitis in her lower extremity and had completed course of Bactrim and doxycycline with resolution.  Over the past few days of the antibiotic course, patient also had nausea and vomiting, 2-3 episodes.  No hematemesis.  Baseline creatinine around 1.6  Elevated LFTs,.  Potassium 6.0.  Bicarbonate 21.  Received Lokelma 10 mg p.o. x1, IV fluids, Levaquin.  Patient was admitted for further work-up.  Assessment and Plan: * Acute metabolic encephalopathy - Likely in the setting of dehydration, recent nausea vomiting, decreased p.o. intake, AKI, acute transaminitis.  -No focal neurological deficits, CT head showed no evidence of acute intracranial process.  Ammonia level 18. -Currently fully alert and oriented, at baseline     SIRS with pneumonitis - Patient met SIRS criteria.  Severe sepsis ruled out.  No  hypotension, lactic acidosis.  AKI likely due to Bactrim/ACE inhibitor use, poor p.o. intake, nausea vomiting and dehydration.  -Patient was placed on IV Levaquin and Flagyl, transitioned to oral Levaquin and Flagyl at discharge. -  WBC count, procalcitonin improving. -CT chest showed groundglass opacities with a leukocytosis and elevated procalcitonin.  Possibly had hypersensitivity reaction to Bactrim or doxycycline.    COVID-negative. -Right foot x-ray negative for osteomyelitis (had right heel wound with pain 2 weeks ago, appears to have healed)  Acute transaminitis - Likely due to statin, Bactrim/doxycycline use, SIRS.  - Abdominal ultrasound showed previous cholecystectomy, no acute cholecystitis, no biliary dilatation -Continue to hold statins, patient received IV fluid hydration. -CK level normal, LFTs trending down, AST 153, ALT 210, alk phos 97 at the time of discharge.  Acute renal failure superimposed on stage 3b chronic kidney disease (HCC) -Likely multifactorial due to dehydration, recent Bactrim use, ACE inhibitor  - presented with creatinine of 2.46 (creatinine was 2.5 on 3/4, 1.65 on 08/16/2021) -Placed on Ringer lactate IV fluids, creatinine improved to 1.9 at the time of discharge.  Encouraged to continue with p.o. fluids.  Hyperkalemia Likely secondary to AKI, enalapril, Bactrim.  Potassium 5.4 at the time of admission -Patient received Lokelma x1 on admission, potassium improved to 4.6        PAF (paroxysmal atrial fibrillation) (HCC) - Continue amiodarone, beta-blocker -Continue Xarelto   Essential hypertension - Hold enalapril due to AKI, continue Lopressor  Type 2 diabetes mellitus (HCC) - Continue outpatient insulin regimen  Obesity (BMI 30-39.9) Body mass index is 34.32 kg/m.  Chronic diastolic CHF (congestive heart failure) (HCC) - 2D echo with  normal EF, currently appears to be euvolemic.  Was dehydrated at the time of admission due to recent nausea  vomiting, poor p.o. intake.  -Patient is only Lasix as needed at home, hold until follow-up with PCP.  - 2D echo on 06/24/2021 had shown EF of 60 to 65%, normal systolic function  Hyperlipidemia -Hold statin in the setting of acute transaminitis           Pain control - [facility] Controlled Substance Reporting System database was reviewed. and patient was instructed, not to drive, operate heavy machinery, perform activities at heights, swimming or participation in water activities or provide baby-sitting services while on Pain, Sleep and Anxiety Medications; until their outpatient Physician has advised to do so again. Also recommended to not to take more than prescribed Pain, Sleep and Anxiety Medications.  Consultants:  Procedures performed: None Disposition: Home Diet recommendation:  Discharge Diet Orders (From admission, onward)     Start     Ordered   10/03/21 0000  Diet Carb Modified        10/03/21 1203           Carb modified diet DISCHARGE MEDICATION: Allergies as of 10/03/2021       Reactions   Baclofen Other (See Comments)   Confusion AMS, Afib, liver enzymes elevate   Esomeprazole Sodium Swelling   Penicillins Anaphylaxis, Rash   Has patient had a PCN reaction causing immediate rash, facial/tongue/throat swelling, SOB or lightheadedness with hypotension: Yes Has patient had a PCN reaction causing severe rash involving mucus membranes or skin necrosis: No Has patient had a PCN reaction that required hospitalization: No Has patient had a PCN reaction occurring within the last 10 years: No If all of the above answers are "NO", then may proceed with Cephalosporin use.   Bactrim [sulfamethoxazole-trimethoprim] Nausea And Vomiting   Clindamycin/lincomycin Hives      Hydrocodone Other (See Comments)   Makes her crazy   Nsaids Hives, Other (See Comments)   kidney failure   Valsartan Swelling   Hydrocodone-acetaminophen Anxiety   Other reaction(s): Confusion  (intolerance), Malaise (intolerance)   Prilosec [omeprazole] Swelling, Rash        Medication List     STOP taking these medications    dabigatran 75 MG Caps capsule Commonly known as: PRADAXA   doxycycline 100 MG capsule Commonly known as: VIBRAMYCIN       TAKE these medications    alendronate 70 MG tablet Commonly known as: FOSAMAX Take 70 mg by mouth every Saturday. Take with a full glass of water on an empty stomach.   amiodarone 200 MG tablet Commonly known as: PACERONE Take 200 mg by mouth daily.   amLODipine 5 MG tablet Commonly known as: NORVASC Take 1 tablet (5 mg total) by mouth daily.   atorvastatin 40 MG tablet Commonly known as: LIPITOR Take 1 tablet (40 mg total) by mouth daily. HOLD until follow-up with your PCP What changed: additional instructions   Basaglar KwikPen 100 UNIT/ML Inject 22 Units into the skin 2 (two) times daily.   Belbuca 300 MCG Film Generic drug: Buprenorphine HCl Place 1 Film inside cheek 2 (two) times daily.   calcitRIOL 0.25 MCG capsule Commonly known as: ROCALTROL Take 0.25 mcg by mouth daily.   Cerefolin NAC 6-2-600 MG Tabs Take 1 tablet by mouth daily.   cetirizine 10 MG tablet Commonly known as: ZYRTEC Take 10 mg by mouth daily as needed for allergies.   diclofenac Sodium 1 % Gel Commonly known  as: VOLTAREN Apply 2 g topically 2 (two) times daily as needed (joint pain).   enalapril 2.5 MG tablet Commonly known as: VASOTEC Take 1 tablet (2.5 mg total) by mouth daily. HOLD until follow-up with your PCP What changed: additional instructions   EPINEPHrine 0.3 mg/0.3 mL Soaj injection Commonly known as: EPI-PEN Inject 0.3 mg into the muscle once as needed (severe allergic reaction).   furosemide 20 MG tablet Commonly known as: LASIX Take 1 tablet (20 mg total) by mouth daily for 2 days. HOLD until follow up with your PCP What changed: additional instructions   insulin aspart 100 UNIT/ML FlexPen Commonly  known as: NOVOLOG Inject 4-16 Units into the skin 3 (three) times daily before meals. Per sliding scale   ipratropium 0.06 % nasal spray Commonly known as: ATROVENT Place 2 sprays into both nostrils 4 (four) times daily as needed for rhinitis.   levofloxacin 500 MG tablet Commonly known as: Levaquin Take 1 tablet (500 mg total) by mouth every other day for 3 doses. Start taking on: October 05, 2021   metaxalone 800 MG tablet Commonly known as: SKELAXIN Take 800 mg by mouth 3 (three) times daily as needed for muscle spasms.   metoprolol tartrate 25 MG tablet Commonly known as: LOPRESSOR Take 25 mg by mouth 2 (two) times daily.   metroNIDAZOLE 500 MG tablet Commonly known as: Flagyl Take 1 tablet (500 mg total) by mouth 2 (two) times daily for 3 days.   mirtazapine 15 MG tablet Commonly known as: REMERON Take 15 mg by mouth at bedtime.   nitroGLYCERIN 0.4 MG SL tablet Commonly known as: NITROSTAT Place 0.4 mg under the tongue every 5 (five) minutes as needed for chest pain.   OMEGA 3 PO Take 1 capsule by mouth daily.   ondansetron 4 MG disintegrating tablet Commonly known as: ZOFRAN-ODT Take 1 tablet (4 mg total) by mouth every 8 (eight) hours as needed for nausea or vomiting.   QUEtiapine 25 MG tablet Commonly known as: SEROQUEL Take 25 mg by mouth at bedtime.   Rivaroxaban 15 MG Tabs tablet Commonly known as: XARELTO Take 15 mg by mouth daily with supper.   Vitamin D 50 MCG (2000 UT) tablet Take 4,000 Units by mouth daily.        Discharge Exam: Filed Weights   10/01/21 2159  Weight: 90.7 kg   S: Doing well, alert and oriented, daughter at the bedside  Vitals:   10/03/21 0042 10/03/21 0406 10/03/21 0744 10/03/21 1106  BP: (!) 139/51 (!) 154/57 (!) 145/78 (!) 150/65  Pulse: 60 (!) 59 (!) 59 64  Resp: 18 18 16 16   Temp: 97.9 F (36.6 C) 97.9 F (36.6 C) 97.9 F (36.6 C) 97.9 F (36.6 C)  TempSrc: Oral Oral Oral Oral  SpO2: 98% 95% 99% 100%  Weight:       Height:        Physical Exam General: Alert and oriented x 3, NAD Cardiovascular: S1 S2 clear, RRR. No pedal edema b/l Respiratory: CTAB, no wheezing, rales or rhonchi Gastrointestinal: Soft, nontender, nondistended, NBS Ext: no pedal edema bilaterally Neuro: no new deficits  Condition at discharge: fair  The results of significant diagnostics from this hospitalization (including imaging, microbiology, ancillary and laboratory) are listed below for reference.   Imaging Studies: DG Chest 2 View  Result Date: 10/01/2021 CLINICAL DATA:  Shortness of breath EXAM: CHEST - 2 VIEW COMPARISON:  Chest radiograph dated September 27, 2021 FINDINGS: The heart is enlarged. Evidence of  prior CABG. Pacemaker leads are unchanged. Aorta is tortuous with atherosclerotic calcifications. Lungs are clear without evidence of focal consolidation or pleural effusion. IMPRESSION: No active cardiopulmonary disease. Electronically Signed   By: Larose Hires D.O.   On: 10/01/2021 16:21   CT Head Wo Contrast  Result Date: 10/01/2021 CLINICAL DATA:  Mental status change. EXAM: CT HEAD WITHOUT CONTRAST TECHNIQUE: Contiguous axial images were obtained from the base of the skull through the vertex without intravenous contrast. RADIATION DOSE REDUCTION: This exam was performed according to the departmental dose-optimization program which includes automated exposure control, adjustment of the mA and/or kV according to patient size and/or use of iterative reconstruction technique. COMPARISON:  CT head dated August 16, 2021 FINDINGS: Brain: No evidence of acute infarction, hemorrhage, hydrocephalus, extra-axial collection or mass lesion/mass effect. Cerebral atrophy and chronic microvascular ischemic changes of the white matter, unchanged Vascular: No hyperdense vessel or unexpected calcification. Skull: Normal. Negative for fracture or focal lesion. Sinuses/Orbits: Bilateral cataract surgery.  No acute abnormality Other: None.  IMPRESSION: 1.  No acute intracranial abnormality. 2. Cerebral atrophy and chronic microvascular ischemic changes of the white matter, unchanged. Electronically Signed   By: Larose Hires D.O.   On: 10/01/2021 15:26   CT Chest Wo Contrast  Result Date: 10/01/2021 CLINICAL DATA:  Respiratory illness, nondiagnostic x-ray. EXAM: CT CHEST WITHOUT CONTRAST TECHNIQUE: Multidetector CT imaging of the chest was performed following the standard protocol without IV contrast. RADIATION DOSE REDUCTION: This exam was performed according to the departmental dose-optimization program which includes automated exposure control, adjustment of the mA and/or kV according to patient size and/or use of iterative reconstruction technique. COMPARISON:  Chest radiographs today, 09/27/2021 and 05/27/2021. Abdominal MRI 02/17/2017. FINDINGS: Cardiovascular: There is diffuse atherosclerosis of the aorta, great vessels and coronary arteries status post median sternotomy and CABG. Right subclavian pacemaker leads extend into the right atrium and right ventricle. The heart is enlarged. No significant pericardial fluid. Mediastinum/Nodes: There are no enlarged mediastinal, hilar or axillary lymph nodes.There are scattered small calcified mediastinal and right hilar lymph nodes. Evidence of previous right thyroid lobectomy. 1.1 cm low-density nodule in the left thyroid on image 19/2; No followup recommended.(Ref: J Am Coll Radiol. 2015 Feb;12(2): 143-50). Diffuse calcifications of the tracheobronchial tree. No significant esophageal abnormalities. Lungs/Pleura: No pleural effusion or pneumothorax. Pulmonary assessment mildly limited by breathing artifact. There is mild central airway thickening with scattered ground-glass opacities which could reflect edema, inflammation or scarring. There are scattered calcified granulomas. No confluent airspace opacity or suspicious pulmonary nodule. Upper abdomen: No significant findings are seen within the  visualized upper abdomen. Diffuse aortic and branch vessel atherosclerosis. Musculoskeletal/Chest wall: There is no chest wall mass or suspicious osseous finding. Previous L1 spinal augmentation and median sternotomy. Degenerative changes at the sternoclavicular joints bilaterally. IMPRESSION: 1. Nonspecific patchy ground-glass opacities in both lungs which could reflect mild edema, inflammation or scarring. Suggest radiographic follow-up. 2. No confluent airspace opacity or significant pleural effusion. 3. Cardiomegaly post CABG. Coronary and Aortic Atherosclerosis (ICD10-I70.0). 4. Sequela of prior granulomatous disease. Electronically Signed   By: Carey Bullocks M.D.   On: 10/01/2021 17:31   DG Chest Portable 1 View  Result Date: 09/27/2021 CLINICAL DATA:  Shortness of breath EXAM: PORTABLE CHEST 1 VIEW COMPARISON:  Chest radiograph dated May 27, 2021 FINDINGS: Heart is enlarged. Evidence of prior CABG and right access pacemaker leads terminating in the right atrium and right ventricle. Atherosclerotic calcification of the aortic arch. Lungs are clear without evidence of  focal consolidation or large pleural effusion. Mild bilateral acromioclavicular osteoarthritis. IMPRESSION: 1. Stable cardiomegaly with evidence of prior CABG with a pacemaker. 2. No acute cardiopulmonary process. Electronically Signed   By: Larose Hires D.O.   On: 09/27/2021 13:35   DG Foot Complete Right  Result Date: 10/02/2021 CLINICAL DATA:  Heel ulcer. EXAM: RIGHT FOOT COMPLETE - 3+ VIEW COMPARISON:  None. FINDINGS: Soft tissue swelling is present under the calcaneus. No discrete ulceration is present. No osseous changes present. A small plantar calcaneal spur is present. IMPRESSION: 1. Soft tissue swelling under the calcaneus without discrete ulceration or underlying osseous changes. 2. Small plantar calcaneal spur. Electronically Signed   By: Marin Roberts M.D.   On: 10/02/2021 11:15   US ABDOMEN LIMITED RUQ  (LIVER/GB)  Result Date: 10/02/2021 CLINICAL DATA:  88 year old with transaminitis. EXAM: ULTRASOUND ABDOMEN LIMITED RIGHT UPPER QUADRANT COMPARISON:  Ultrasound 02/14/2017, MRCP 02/17/2017 FINDINGS: Gallbladder: Surgically absent. Common bile duct: Diameter: 6-7 mm, normal. No visualized stone in the common bile duct. Liver: Heterogeneous hepatic parenchyma without focal lesion. No capsular nodularity. Portal vein is patent on color Doppler imaging with normal direction of blood flow towards the liver. Other: No right upper quadrant ascites. IMPRESSION: 1. Heterogeneous hepatic parenchyma without focal lesion. 2. Cholecystectomy without biliary dilatation. Electronically Signed   By: Narda Rutherford M.D.   On: 10/02/2021 00:52    Microbiology: Results for orders placed or performed during the hospital encounter of 10/01/21  Resp Panel by RT-PCR (Flu A&B, Covid) Nasopharyngeal Swab     Status: None   Collection Time: 10/01/21  4:30 PM   Specimen: Nasopharyngeal Swab; Nasopharyngeal(NP) swabs in vial transport medium  Result Value Ref Range Status   SARS Coronavirus 2 by RT PCR NEGATIVE NEGATIVE Final    Comment: (NOTE) SARS-CoV-2 target nucleic acids are NOT DETECTED.  The SARS-CoV-2 RNA is generally detectable in upper respiratory specimens during the acute phase of infection. The lowest concentration of SARS-CoV-2 viral copies this assay can detect is 138 copies/mL. A negative result does not preclude SARS-Cov-2 infection and should not be used as the sole basis for treatment or other patient management decisions. A negative result may occur with  improper specimen collection/handling, submission of specimen other than nasopharyngeal swab, presence of viral mutation(s) within the areas targeted by this assay, and inadequate number of viral copies(<138 copies/mL). A negative result must be combined with clinical observations, patient history, and epidemiological information. The expected  result is Negative.  Fact Sheet for Patients:  BloggerCourse.com  Fact Sheet for Healthcare Providers:  SeriousBroker.it  This test is no t yet approved or cleared by the [place] FDA and  has been authorized for detection and/or diagnosis of SARS-CoV-2 by FDA under an Emergency Use Authorization (EUA). This EUA will remain  in effect (meaning this test can be used) for the duration of the COVID-19 declaration under Section 564(b)(1) of the Act, 21 U.S.C.section 360bbb-3(b)(1), unless the authorization is terminated  or revoked sooner.       Influenza A by PCR NEGATIVE NEGATIVE Final   Influenza B by PCR NEGATIVE NEGATIVE Final    Comment: (NOTE) The Xpert Xpress SARS-CoV-2/FLU/RSV plus assay is intended as an aid in the diagnosis of influenza from Nasopharyngeal swab specimens and should not be used as a sole basis for treatment. Nasal washings and aspirates are unacceptable for Xpert Xpress SARS-CoV-2/FLU/RSV testing.  Fact Sheet for Patients: BloggerCourse.com  Fact Sheet for Healthcare Providers: SeriousBroker.it  This test is not yet  approved or cleared by the [place] and has been authorized for detection and/or diagnosis of SARS-CoV-2 by FDA under an Emergency Use Authorization (EUA). This EUA will remain in effect (meaning this test can be used) for the duration of the COVID-19 declaration under Section 564(b)(1) of the Act, 21 U.S.C. section 360bbb-3(b)(1), unless the authorization is terminated or revoked.  Performed at [facility], [address] [place], [place] [address]   Blood culture (routine x 2)     Status: None (Preliminary result)   Collection Time: 10/01/21  4:45 PM   Specimen: BLOOD  Result Value Ref Range Status   Specimen Description   Final    BLOOD LEFT ANTECUBITAL Performed at [facility], [address] [place], [place] [address]    Special Requests   Final    BOTTLES DRAWN AEROBIC AND ANAEROBIC Blood Culture adequate volume Performed at [facility], [address] [place], [place] [address]    Culture   Final    NO GROWTH < 24 HOURS Performed at [facility], [address] [place], [place], [place] [address]    Report Status PENDING  Incomplete  Blood culture (routine x 2)     Status: None (Preliminary result)   Collection Time: 10/01/21  4:49 PM   Specimen: BLOOD  Result Value Ref Range Status   Specimen Description   Final    BLOOD RIGHT ANTECUBITAL Performed at [facility], [address] [place], [place] [address]    Special Requests   Final    BOTTLES DRAWN AEROBIC AND ANAEROBIC Blood Culture adequate volume Performed at [facility], [address] [place], [place] [address]    Culture   Final    NO GROWTH < 24 HOURS Performed at [facility], [address] [place], [place] [address]    Report Status PENDING  Incomplete    Labs: CBC: Recent Labs  Lab 09/27/21 1325 10/01/21 1539 10/02/21 0105 10/03/21 0347  WBC 7.0 23.3* 20.8* 8.2  NEUTROABS 4.8  --  18.6*  --   HGB 12.0 11.0* 10.3* 10.7*  HCT 37.6 34.7* 32.0* 34.3*  MCV 88.9 89.9 90.4 93.0  PLT 217 201 193 155   Basic Metabolic Panel: Recent Labs  Lab 09/27/21 1325 10/01/21 1539 10/01/21 2258 10/02/21 0105 10/03/21 0347  NA 134* 136 134* 135 135  K 4.9 6.0* 5.4* 5.1 4.6  CL 103 105 105 106 107  CO2 21* 21* 20* 20* 19*  GLUCOSE 68* 182* 135* 107* 169*  BUN 31* 45* 39* 38* 31*  CREATININE 2.53* 2.46* 2.44* 2.44* 1.98*  CALCIUM 9.3 9.3 9.0 9.0 8.8*  MG  --   --   --  1.9 1.8   Liver Function Tests: Recent Labs  Lab 09/27/21 1325 10/01/21 1539 10/02/21 0105 10/03/21 0347  AST 33 649* 391* 153*  ALT 24 495* 356* 210*  ALKPHOS 71 293* 228* 181*  BILITOT 0.5 1.9* 1.4* 0.6  PROT 7.0 7.0 5.9* 5.8*  ALBUMIN 3.7 3.8 3.1* 2.9*   CBG: Recent Labs   Lab 10/02/21 1136 10/02/21 1611 10/02/21 2119 10/03/21 0616 10/03/21 1105  GLUCAP 224* 167* 231* 132* 181*    Discharge time spent: greater than 30 minutes.  Signed: Thad Ranger, MD [facility] 10/03/2021

## 2021-10-04 LAB — METHYLMALONIC ACID, SERUM: Methylmalonic Acid, Quantitative: 132 nmol/L (ref 0–378)

## 2021-10-06 LAB — CULTURE, BLOOD (ROUTINE X 2)
Culture: NO GROWTH
Culture: NO GROWTH
Special Requests: ADEQUATE
Special Requests: ADEQUATE

## 2021-10-28 ENCOUNTER — Encounter (HOSPITAL_COMMUNITY): Payer: Self-pay | Admitting: Radiology

## 2022-04-12 ENCOUNTER — Other Ambulatory Visit: Payer: Self-pay

## 2022-04-12 ENCOUNTER — Emergency Department (HOSPITAL_BASED_OUTPATIENT_CLINIC_OR_DEPARTMENT_OTHER): Payer: Medicare Other

## 2022-04-12 ENCOUNTER — Emergency Department (HOSPITAL_BASED_OUTPATIENT_CLINIC_OR_DEPARTMENT_OTHER)
Admission: EM | Admit: 2022-04-12 | Discharge: 2022-04-12 | Disposition: A | Discharge status: Home / Self Care | Payer: Medicare Other | Attending: Emergency Medicine | Admitting: Emergency Medicine

## 2022-04-12 ENCOUNTER — Encounter (HOSPITAL_BASED_OUTPATIENT_CLINIC_OR_DEPARTMENT_OTHER): Payer: Self-pay | Admitting: Emergency Medicine

## 2022-04-12 DIAGNOSIS — K5730 Diverticulosis of large intestine without perforation or abscess without bleeding: Secondary | ICD-10-CM | POA: Insufficient documentation

## 2022-04-12 DIAGNOSIS — Z87891 Personal history of nicotine dependence: Secondary | ICD-10-CM | POA: Diagnosis not present

## 2022-04-12 DIAGNOSIS — Z20822 Contact with and (suspected) exposure to covid-19: Secondary | ICD-10-CM | POA: Insufficient documentation

## 2022-04-12 DIAGNOSIS — I509 Heart failure, unspecified: Secondary | ICD-10-CM | POA: Insufficient documentation

## 2022-04-12 DIAGNOSIS — I700 Atherosclerosis of aorta: Secondary | ICD-10-CM | POA: Diagnosis not present

## 2022-04-12 DIAGNOSIS — E1122 Type 2 diabetes mellitus with diabetic chronic kidney disease: Secondary | ICD-10-CM | POA: Diagnosis not present

## 2022-04-12 DIAGNOSIS — Z951 Presence of aortocoronary bypass graft: Secondary | ICD-10-CM | POA: Insufficient documentation

## 2022-04-12 DIAGNOSIS — R918 Other nonspecific abnormal finding of lung field: Secondary | ICD-10-CM | POA: Diagnosis not present

## 2022-04-12 DIAGNOSIS — R079 Chest pain, unspecified: Secondary | ICD-10-CM | POA: Insufficient documentation

## 2022-04-12 DIAGNOSIS — N184 Chronic kidney disease, stage 4 (severe): Secondary | ICD-10-CM | POA: Diagnosis not present

## 2022-04-12 DIAGNOSIS — Z7901 Long term (current) use of anticoagulants: Secondary | ICD-10-CM | POA: Insufficient documentation

## 2022-04-12 DIAGNOSIS — I2510 Atherosclerotic heart disease of native coronary artery without angina pectoris: Secondary | ICD-10-CM | POA: Diagnosis not present

## 2022-04-12 DIAGNOSIS — Z794 Long term (current) use of insulin: Secondary | ICD-10-CM | POA: Diagnosis not present

## 2022-04-12 LAB — CBC
HCT: 36.2 % (ref 36.0–46.0)
Hemoglobin: 11.3 g/dL — ABNORMAL LOW (ref 12.0–15.0)
MCH: 28.5 pg (ref 26.0–34.0)
MCHC: 31.2 g/dL (ref 30.0–36.0)
MCV: 91.2 fL (ref 80.0–100.0)
Platelets: 199 10*3/uL (ref 150–400)
RBC: 3.97 MIL/uL (ref 3.87–5.11)
RDW: 14.2 % (ref 11.5–15.5)
WBC: 8 10*3/uL (ref 4.0–10.5)
nRBC: 0 % (ref 0.0–0.2)

## 2022-04-12 LAB — BASIC METABOLIC PANEL
Anion gap: 8 (ref 5–15)
BUN: 40 mg/dL — ABNORMAL HIGH (ref 8–23)
CO2: 24 mmol/L (ref 22–32)
Calcium: 10.3 mg/dL (ref 8.9–10.3)
Chloride: 106 mmol/L (ref 98–111)
Creatinine, Ser: 2.22 mg/dL — ABNORMAL HIGH (ref 0.44–1.00)
GFR, Estimated: 21 mL/min — ABNORMAL LOW (ref >60)
Glucose, Bld: 228 mg/dL — ABNORMAL HIGH (ref 70–99)
Potassium: 4.9 mmol/L (ref 3.5–5.1)
Sodium: 138 mmol/L (ref 135–145)

## 2022-04-12 LAB — TROPONIN I (HIGH SENSITIVITY)
Troponin I (High Sensitivity): 12 ng/L (ref <18)
Troponin I (High Sensitivity): 15 ng/L (ref <18)

## 2022-04-12 LAB — HEPATIC FUNCTION PANEL
ALT: 45 U/L — ABNORMAL HIGH (ref 0–44)
AST: 38 U/L (ref 15–41)
Albumin: 3.6 g/dL (ref 3.5–5.0)
Alkaline Phosphatase: 132 U/L — ABNORMAL HIGH (ref 38–126)
Bilirubin, Direct: <0.1 mg/dL (ref 0.0–0.2)
Total Bilirubin: 0.6 mg/dL (ref 0.3–1.2)
Total Protein: 7.1 g/dL (ref 6.5–8.1)

## 2022-04-12 LAB — PROTIME-INR
INR: 2.1 — ABNORMAL HIGH (ref 0.8–1.2)
Prothrombin Time: 23.6 seconds — ABNORMAL HIGH (ref 11.4–15.2)

## 2022-04-12 LAB — LIPASE, BLOOD: Lipase: 21 U/L (ref 11–51)

## 2022-04-12 LAB — SARS CORONAVIRUS 2 BY RT PCR: SARS Coronavirus 2 by RT PCR: NEGATIVE

## 2022-04-12 NOTE — ED Provider Notes (Signed)
[facility] Provider Note   CSN: [id number] Arrival date & time: 04/12/22  1554     History  Chief Complaint  Patient presents with   Chest Pain    Daisy Pearson is a 88 y.o. female with history of type 2 diabetes complicated by CKD stage IV, CHF, CAD status post CABG, paroxysmal A-fib on Xarelto who presents to the emergency department for evaluation of midsternal chest pressure that occurred once yesterday and again today.  Patient is accompanied by her daughter and states that yesterday patient was complaining of chest pressure radiating into her back and across her abdomen.  She had nausea and several episodes of vomiting.  They gave her Zofran and nitro and symptoms resolved.  Patient was doing well for the day until about 3 PM when she again developed substernal chest pressure.  Symptoms lasted approximately 30 minutes until she was given a nitro and since resolved.  She had some shortness of breath during these episodes.  Currently, she denies chest pain, shortness of breath, abdominal pain, nausea, vomiting, diarrhea, fevers, cough, congestion and leg swelling.  Daughter notes that they have been intermittently dealing with issues of constipation.   Chest Pain Associated symptoms: no abdominal pain, no headache, no nausea, no palpitations and no vomiting        Home Medications Prior to Admission medications   Medication Sig Start Date End Date Taking? Authorizing Provider  alendronate (FOSAMAX) 70 MG tablet Take 70 mg by mouth every Saturday. Take with a full glass of water on an empty stomach.    [provider]  amiodarone (PACERONE) 200 MG tablet Take 200 mg by mouth daily.    [provider]  amLODipine (NORVASC) 5 MG tablet Take 1 tablet (5 mg total) by mouth daily. 02/24/17   Ina Homes, MD  atorvastatin (LIPITOR) 40 MG tablet Take 1 tablet (40 mg total) by mouth daily. HOLD until follow-up with your PCP 10/03/21    Rai, Vernelle Emerald, MD  Buprenorphine HCl (BELBUCA) 300 MCG FILM Place 1 Film inside cheek 2 (two) times daily.  03/20/19   [provider]  calcitRIOL (ROCALTROL) 0.25 MCG capsule Take 0.25 mcg by mouth daily.    [provider]  cetirizine (ZYRTEC) 10 MG tablet Take 10 mg by mouth daily as needed for allergies.    [provider]  Cholecalciferol (VITAMIN D) 2000 units tablet Take 4,000 Units by mouth daily.     [provider]  diclofenac Sodium (VOLTAREN) 1 % GEL Apply 2 g topically 2 (two) times daily as needed (joint pain).    [provider]  enalapril (VASOTEC) 2.5 MG tablet Take 1 tablet (2.5 mg total) by mouth daily. HOLD until follow-up with your PCP 10/03/21   Rai, Vernelle Emerald, MD  EPINEPHrine 0.3 mg/0.3 mL IJ SOAJ injection Inject 0.3 mg into the muscle once as needed (severe allergic reaction).    [provider]  furosemide (LASIX) 20 MG tablet Take 1 tablet (20 mg total) by mouth daily for 2 days. HOLD until follow up with your PCP 10/03/21 10/05/21  Rai, Vernelle Emerald, MD  insulin aspart (NOVOLOG) 100 UNIT/ML FlexPen Inject 4-16 Units into the skin 3 (three) times daily before meals. Per sliding scale 04/30/21   [provider]  Insulin Glargine (BASAGLAR KWIKPEN) 100 UNIT/ML Inject 22 Units into the skin 2 (two) times daily. 08/07/21   [provider]  ipratropium (ATROVENT) 0.06 % nasal spray Place 2 sprays  into both nostrils 4 (four) times daily as needed for rhinitis. 08/02/19   [provider]  metaxalone (SKELAXIN) 800 MG tablet Take 800 mg by mouth 3 (three) times daily as needed for muscle spasms.    [provider]  Methylfol-Methylcob-Acetylcyst (CEREFOLIN NAC) 6-2-600 MG TABS Take 1 tablet by mouth daily.    [provider]  metoprolol tartrate (LOPRESSOR) 25 MG tablet Take 25 mg by mouth 2 (two) times daily.    [provider]  mirtazapine (REMERON) 15 MG tablet Take 15 mg by mouth  at bedtime.    [provider]  nitroGLYCERIN (NITROSTAT) 0.4 MG SL tablet Place 0.4 mg under the tongue every 5 (five) minutes as needed for chest pain.    [provider]  Omega-3 Fatty Acids (OMEGA 3 PO) Take 1 capsule by mouth daily.    [provider]  ondansetron (ZOFRAN-ODT) 4 MG disintegrating tablet Take 1 tablet (4 mg total) by mouth every 8 (eight) hours as needed for nausea or vomiting. 09/27/21   Gareth Morgan, MD  QUEtiapine (SEROQUEL) 25 MG tablet Take 25 mg by mouth at bedtime.    [provider]  Rivaroxaban (XARELTO) 15 MG TABS tablet Take 15 mg by mouth daily with supper. 09/03/21 09/03/22  [provider]      Allergies    Baclofen, Esomeprazole sodium, Penicillins, Bactrim [sulfamethoxazole-trimethoprim], Clindamycin/lincomycin, Hydrocodone, Nsaids, Valsartan, Hydrocodone-acetaminophen, and Prilosec [omeprazole]    Review of Systems   Review of Systems  Cardiovascular:  Positive for chest pain. Negative for palpitations.  Gastrointestinal:  Negative for abdominal pain, nausea and vomiting.  Genitourinary:  Negative for dysuria and hematuria.  Neurological:  Negative for headaches.    Physical Exam Updated Vital Signs BP (!) 142/48   Pulse (!) 59   Temp 98 F (36.7 C) (Oral)   Resp 19   Ht 5\' 3"  (1.6 m)   Wt 87.1 kg   SpO2 97%   BMI 34.01 kg/m  Physical Exam Vitals and nursing note reviewed.  Constitutional:      General: She is not in acute distress.    Appearance: She is not ill-appearing.  HENT:     Head: Atraumatic.  Eyes:     Conjunctiva/sclera: Conjunctivae normal.  Cardiovascular:     Rate and Rhythm: Normal rate and regular rhythm.     Pulses: Normal pulses.          Radial pulses are 2+ on the right side and 2+ on the left side.       Dorsalis pedis pulses are 2+ on the right side and 2+ on the left side.     Heart sounds: No murmur heard. Pulmonary:     Effort: Pulmonary effort is normal. No  respiratory distress.     Breath sounds: Normal breath sounds.  Abdominal:     General: Abdomen is flat. There is no distension.     Palpations: Abdomen is soft.     Tenderness: There is abdominal tenderness in the periumbilical area. There is no right CVA tenderness, left CVA tenderness or rebound. Negative signs include Murphy's sign and McBurney's sign.  Musculoskeletal:        General: Normal range of motion.     Cervical back: Normal range of motion.     Right lower leg: No edema.     Left lower leg: No edema.  Skin:    General: Skin is warm and dry.     Capillary Refill: Capillary refill takes less  than 2 seconds.  Neurological:     General: No focal deficit present.     Mental Status: She is alert.  Psychiatric:        Mood and Affect: Mood normal.     ED Results / Procedures / Treatments   Labs (all labs ordered are listed, but only abnormal results are displayed) Labs Reviewed  BASIC METABOLIC PANEL - Abnormal; Notable for the following components:      Result Value   Glucose, Bld 228 (*)    BUN 40 (*)    Creatinine, Ser 2.22 (*)    GFR, Estimated 21 (*)    All other components within normal limits  CBC - Abnormal; Notable for the following components:   Hemoglobin 11.3 (*)    All other components within normal limits  PROTIME-INR - Abnormal; Notable for the following components:   Prothrombin Time 23.6 (*)    INR 2.1 (*)    All other components within normal limits  HEPATIC FUNCTION PANEL - Abnormal; Notable for the following components:   ALT 45 (*)    Alkaline Phosphatase 132 (*)    All other components within normal limits  SARS CORONAVIRUS 2 BY RT PCR  LIPASE, BLOOD  TROPONIN I (HIGH SENSITIVITY)  TROPONIN I (HIGH SENSITIVITY)    EKG EKG Interpretation  Date/Time:  Sunday April 12 2022 16:01:47 EDT Ventricular Rate:  65 PR Interval:  278 QRS Duration: 90 QT Interval:  426 QTC Calculation: 443 R Axis:   17 Text Interpretation: Atrial-paced  rhythm with prolonged AV conduction with occasional ventricular-paced complexes Cannot rule out Anterior infarct , age undetermined Abnormal ECG When compared with ECG of 01-Oct-2021 13:33, PREVIOUS ECG IS PRESENT Confirmed by Fredia Sorrow [phone]) on 04/12/2022 5:23:24 PM  Radiology CT CHEST ABDOMEN PELVIS WO CONTRAST  Result Date: 04/12/2022 CLINICAL DATA:  Midsternal chest pressure yesterday, nausea and vomiting EXAM: CT CHEST, ABDOMEN AND PELVIS WITHOUT CONTRAST TECHNIQUE: Multidetector CT imaging of the chest, abdomen and pelvis was performed following the standard protocol without IV contrast. Imaging was performed without intravenous contrast at the request of the ordering physician. Evaluation of the soft tissues, solid viscera, and vascular structures is severely limited without IV contrast. RADIATION DOSE REDUCTION: This exam was performed according to the departmental dose-optimization program which includes automated exposure control, adjustment of the mA and/or kV according to patient size and/or use of iterative reconstruction technique. COMPARISON:  10/01/2021 FINDINGS: CT CHEST FINDINGS Cardiovascular: Unenhanced imaging of the heart demonstrates stable cardiomegaly and multi lead pacemaker. No pericardial effusion. Postsurgical changes from CABG. There is normal caliber of the thoracic aorta, with no change since prior exam. Diffuse atherosclerosis is again noted. Evaluation of the vascular lumen is limited without IV contrast. No periaortic fat stranding. Mediastinum/Nodes: Stable appearance of the thyroid, trachea, and esophagus. No pathologic adenopathy. Lungs/Pleura: Stable bibasilar interstitial prominence with scattered ground-glass opacities most pronounced at the left lung base, likely representing sequela of chronic postinflammatory or postinfectious change. No acute airspace disease, effusion, or pneumothorax. Central airways are patent. Musculoskeletal: No acute or destructive bony  lesions. Reconstructed images demonstrate no additional findings. CT ABDOMEN PELVIS FINDINGS Hepatobiliary: Unremarkable unenhanced appearance of the liver. Cholecystectomy. No biliary duct dilation. Pancreas: Stable pancreatic atrophy. Otherwise unremarkable unenhanced appearance. Spleen: Unremarkable unenhanced appearance. Adrenals/Urinary Tract: 2.1 cm cyst lower pole right kidney does not require specific imaging follow-up. Mild left renal cortical atrophy. Otherwise unremarkable appearance of the kidneys. Bladder is decompressed, limiting its evaluation. Likely prior left adrenalectomy.  The right adrenals unremarkable. Stomach/Bowel: No bowel obstruction or ileus. Normal appendix right lower quadrant. Scattered diverticulosis of the sigmoid colon without evidence of acute diverticulitis. No bowel wall thickening or inflammatory change. Small hiatal hernia. Vascular/Lymphatic: There is diffuse atherosclerosis of the aorta and its branches. Normal caliber of the abdominal aorta. No periaortic fat stranding. Evaluation of the vascular lumen is limited without IV contrast. No pathologic adenopathy. Reproductive: Uterus and bilateral adnexa are unremarkable. Other: No free fluid or free intraperitoneal gas. No abdominal wall hernia. Musculoskeletal: No acute or destructive bony lesions. Chronic L1 compression deformity with prior vertebral augmentation. Reconstructed images demonstrate no additional findings. IMPRESSION: 1. Limited evaluation of the vascular structures without IV contrast. Diffuse aortic atherosclerosis with no evidence of thoracoabdominal aortic aneurysm. 2. Bibasilar interstitial and ground-glass opacities unchanged since prior exam, likely reflecting postinflammatory or postinfectious scarring. 3. Cardiomegaly status post CABG. 4. Sigmoid diverticulosis without diverticulitis. Electronically Signed   By: Randa Ngo M.D.   On: 04/12/2022 17:50   DG Chest 2 View  Result Date:  04/12/2022 CLINICAL DATA:  Chest pain EXAM: CHEST - 2 VIEW COMPARISON:  Chest 10/01/2021 FINDINGS: Prior median sternotomy. Dual lead pacemaker unchanged with leads in the right atrium and right ventricle. Cardiac enlargement without heart failure or edema. Lungs clear without infiltrate or effusion.  No interval change. Severe compression fracture upper lumbar spine unchanged. Apparent kyphoplasty cement IMPRESSION: No active cardiopulmonary disease. Electronically Signed   By: Franchot Gallo M.D.   On: 04/12/2022 17:00    Procedures Procedures    Medications Ordered in ED Medications - No data to display  ED Course/ Medical Decision Making/ A&P                           Medical Decision Making Amount and/or Complexity of Data Reviewed Labs: ordered. Radiology: ordered.   Social determinants of health:  Social History   Socioeconomic History   Marital status: Married    Spouse name: Not on file   Number of children: Not on file   Years of education: Not on file   Highest education level: Not on file  Occupational History   Not on file  Tobacco Use   Smoking status: Former    Types: Cigarettes   Smokeless tobacco: Never  Vaping Use   Vaping Use: Never used  Substance and Sexual Activity   Alcohol use: No   Drug use: No   Sexual activity: Not on file  Other Topics Concern   Not on file  Social History Narrative   Not on file   Social Determinants of Health   Financial Resource Strain: Not on file  Food Insecurity: Not on file  Transportation Needs: Not on file  Physical Activity: Not on file  Stress: Not on file  Social Connections: Not on file  Intimate Partner Violence: Not on file     Initial impression:  This patient presents to the ED for concern of chest pain, this involves an extensive number of treatment options, and is a complaint that carries with it a high risk of complications and morbidity.   Differentials include ACS, dissection, PE, pneumonia,  pancreatitis, CHF exacerbation.   Comorbidities affecting care:  Per HPI  Additional history obtained: Daughter  Lab Tests  I Ordered, reviewed, and interpreted labs and EKG.  The pertinent results include:  BMP without acute findings, creatinine and GFR at around baseline CBC without acute findings Negative COVID Delta troponin  normal  Imaging Studies ordered:  I ordered imaging studies including  Chest x-ray normal CT chest abdomen and pelvis without contrast normal I independently visualized and interpreted imaging and I agree with the radiologist interpretation.   EKG: Atrial paced rhythm   ED Course/Re-evaluation: Patient presents in no acute distress and is nontoxic-appearing.  She is resting comfortably on the bed and is nondiaphoretic.  She is speaking in full and complete sentences and satting at 99% on room air.  Initial elevation of blood pressure as high as 162/43 that improved down to 136/71 without intervention.  On exam, lungs CTA bilaterally without wheezes rales or rhonchi.  2+ distal pulses of the upper and lower extremities.  No obvious lower extremity edema.  EKG with an atrial paced rhythm.  Chest x-ray normal.  Unfortunately, was unable to obtain CT chest abdomen and pelvis for dissection study given patient's low GFR.  Obtained CT chest abdomen pelvis without contrast instead which was normal. On reassessment, patient is still asymptomatic and well-appearing.  Second troponin normal as well.  Given overall reassuring work-up and clinical exam, patient can be discharged home and follow-up outpatient.  My attending, Dr. Rogene Houston also evaluated the patient and agrees with plan of management.  He has suspicion for underlying reflux as cause of her symptoms given the associated abdominal pain and relief with the Zofran and Maalox at home.  Disposition:  After consideration of the diagnostic results, physical exam, history and the patients response to treatment feel  that the patent would benefit from discharge.   Chest pain: Plan and management as described above. Discharged home in good condition.    Final Clinical Impression(s) / ED Diagnoses Final diagnoses:  Chest pain, unspecified type    Rx / DC Orders ED Discharge Orders     None         Tonye Pearson, PA-C 04/12/22 1844    Fredia Sorrow, MD 04/12/22 1911

## 2022-04-12 NOTE — Discharge Instructions (Signed)
Your work-up today was very reassuring.  Your heart enzymes and EKG were normal.  We also got a CT of your chest, abdomen and pelvis which was also normal.  Ultimately, your symptoms may be related to reflux, so please follow-up with your PCP and your cardiologist as needed.  Return if you develop worsening chest pain or shortness of breath.

## 2022-04-12 NOTE — ED Triage Notes (Signed)
Patient reports midsternal chest pressure yesterday with nausea and vomiting, relieved after Zofran and Nitro. Pain returned today, reports discomfort in center of her chest and generalized abdomen. Patient did take nitro approximately an hour ago.

## 2022-04-12 NOTE — ED Notes (Signed)
ED Provider at bedside. 

## 2022-04-12 NOTE — ED Notes (Signed)
Staff at bedside for labs when transport to imaging was attempted.

## 2022-05-24 ENCOUNTER — Emergency Department (HOSPITAL_BASED_OUTPATIENT_CLINIC_OR_DEPARTMENT_OTHER): Payer: Medicare Other

## 2022-05-24 ENCOUNTER — Emergency Department (HOSPITAL_BASED_OUTPATIENT_CLINIC_OR_DEPARTMENT_OTHER)
Admission: EM | Admit: 2022-05-24 | Discharge: 2022-05-24 | Disposition: A | Discharge status: Home / Self Care | Payer: Medicare Other | Attending: Emergency Medicine | Admitting: Emergency Medicine

## 2022-05-24 ENCOUNTER — Encounter (HOSPITAL_BASED_OUTPATIENT_CLINIC_OR_DEPARTMENT_OTHER): Payer: Self-pay | Admitting: Emergency Medicine

## 2022-05-24 DIAGNOSIS — Y92019 Unspecified place in single-family (private) house as the place of occurrence of the external cause: Secondary | ICD-10-CM | POA: Insufficient documentation

## 2022-05-24 DIAGNOSIS — M25522 Pain in left elbow: Secondary | ICD-10-CM | POA: Diagnosis not present

## 2022-05-24 DIAGNOSIS — M25512 Pain in left shoulder: Secondary | ICD-10-CM | POA: Insufficient documentation

## 2022-05-24 DIAGNOSIS — S0990XA Unspecified injury of head, initial encounter: Secondary | ICD-10-CM | POA: Diagnosis present

## 2022-05-24 DIAGNOSIS — Z7901 Long term (current) use of anticoagulants: Secondary | ICD-10-CM | POA: Insufficient documentation

## 2022-05-24 DIAGNOSIS — W19XXXA Unspecified fall, initial encounter: Secondary | ICD-10-CM | POA: Diagnosis not present

## 2022-05-24 DIAGNOSIS — Z79899 Other long term (current) drug therapy: Secondary | ICD-10-CM | POA: Diagnosis not present

## 2022-05-24 NOTE — ED Provider Notes (Signed)
[facility]ERGENC[facility] DEPARTMENT Provider Note   CSN: [id number] Arrival date & time: 05/24/22  1450     History  Chief Complaint  Patient presents with   Daisy Pearson is a 88 y.o. female.  Patient here after mechanical fall at home.  History of A-fib on Xarelto.  No loss of consciousness.  Mechanical fall.  Hit the left side of her head.  Hit her left shoulder.  Hit her left elbow.  Ambulatory afterwards.  Patient was little bit confused after the fall but back at her baseline now.  She is not complaining of any headache or confusion.  Family brought her here because she is on a blood thinner hit her head.  No other extremity pain.  Nothing makes it worse or better.  The history is provided by the patient.       Home Medications Prior to Admission medications   Medication Sig Start Date End Date Taking? Authorizing Provider  alendronate (FOSAMAX) 70 MG tablet Take 70 mg by mouth every Saturday. Take with a full glass of water on an empty stomach.    [provider]  amiodarone (PACERONE) 200 MG tablet Take 200 mg by mouth daily.    [provider]  amLODipine (NORVASC) 5 MG tablet Take 1 tablet (5 mg total) by mouth daily. 02/24/17   Ina Homes, MD  atorvastatin (LIPITOR) 40 MG tablet Take 1 tablet (40 mg total) by mouth daily. HOLD until follow-up with your PCP 10/03/21   Rai, Vernelle Emerald, MD  Buprenorphine HCl (BELBUCA) 300 MCG FILM Place 1 Film inside cheek 2 (two) times daily.  03/20/19   [provider]  calcitRIOL (ROCALTROL) 0.25 MCG capsule Take 0.25 mcg by mouth daily.    [provider]  cetirizine (ZYRTEC) 10 MG tablet Take 10 mg by mouth daily as needed for allergies.    [provider]  Cholecalciferol (VITAMIN D) 2000 units tablet Take 4,000 Units by mouth daily.     [provider]  diclofenac Sodium (VOLTAREN) 1 % GEL Apply 2 g topically 2 (two) times daily as needed (joint pain).     [provider]  enalapril (VASOTEC) 2.5 MG tablet Take 1 tablet (2.5 mg total) by mouth daily. HOLD until follow-up with your PCP 10/03/21   Rai, Vernelle Emerald, MD  EPINEPHrine 0.3 mg/0.3 mL IJ SOAJ injection Inject 0.3 mg into the muscle once as needed (severe allergic reaction).    [provider]  furosemide (LASIX) 20 MG tablet Take 1 tablet (20 mg total) by mouth daily for 2 days. HOLD until follow up with your PCP 10/03/21 10/05/21  Rai, Vernelle Emerald, MD  insulin aspart (NOVOLOG) 100 UNIT/ML FlexPen Inject 4-16 Units into the skin 3 (three) times daily before meals. Per sliding scale 04/30/21   [provider]  Insulin Glargine (BASAGLAR KWIKPEN) 100 UNIT/ML Inject 22 Units into the skin 2 (two) times daily. 08/07/21   [provider]  ipratropium (ATROVENT) 0.06 % nasal spray Place 2 sprays into both nostrils 4 (four) times daily as needed for rhinitis. 08/02/19   [provider]  metaxalone (SKELAXIN) 800 MG tablet Take 800 mg by mouth 3 (three) times daily as needed for muscle spasms.    [provider]  Methylfol-Methylcob-Acetylcyst (CEREFOLIN NAC) 6-2-600 MG TABS Take 1 tablet by mouth daily.    [provider]  metoprolol tartrate (LOPRESSOR) 25 MG tablet Take 25 mg by mouth 2 (two) times  daily.    [provider]  mirtazapine (REMERON) 15 MG tablet Take 15 mg by mouth at bedtime.    [provider]  nitroGLYCERIN (NITROSTAT) 0.4 MG SL tablet Place 0.4 mg under the tongue every 5 (five) minutes as needed for chest pain.    [provider]  Omega-3 Fatty Acids (OMEGA 3 PO) Take 1 capsule by mouth daily.    [provider]  ondansetron (ZOFRAN-ODT) 4 MG disintegrating tablet Take 1 tablet (4 mg total) by mouth every 8 (eight) hours as needed for nausea or vomiting. 09/27/21   Gareth Morgan, MD  QUEtiapine (SEROQUEL) 25 MG tablet Take 25 mg by mouth at bedtime.    [provider]  Rivaroxaban  (XARELTO) 15 MG TABS tablet Take 15 mg by mouth daily with supper. 09/03/21 09/03/22  [provider]      Allergies    Baclofen, Esomeprazole sodium, Penicillins, Bactrim [sulfamethoxazole-trimethoprim], Clindamycin/lincomycin, Hydrocodone, Nsaids, Valsartan, Hydrocodone-acetaminophen, and Prilosec [omeprazole]    Review of Systems   Review of Systems  Physical Exam Updated Vital Signs BP (!) 150/63   Pulse 64   Temp (!) 97.5 F (36.4 C) (Oral)   Resp 20   Ht 5\' 3"  (1.6 m)   Wt 91.2 kg   SpO2 99%   BMI 35.61 kg/m  Physical Exam Vitals and nursing note reviewed.  Constitutional:      General: She is not in acute distress.    Appearance: She is well-developed. She is not ill-appearing.  HENT:     Head: Normocephalic and atraumatic.     Nose: Nose normal.     Mouth/Throat:     Mouth: Mucous membranes are moist.  Eyes:     Extraocular Movements: Extraocular movements intact.     Conjunctiva/sclera: Conjunctivae normal.     Pupils: Pupils are equal, round, and reactive to light.  Cardiovascular:     Rate and Rhythm: Normal rate and regular rhythm.     Heart sounds: No murmur heard. Pulmonary:     Effort: Pulmonary effort is normal. No respiratory distress.     Breath sounds: Normal breath sounds.  Abdominal:     General: Abdomen is flat.     Palpations: Abdomen is soft.     Tenderness: There is no abdominal tenderness.  Musculoskeletal:        General: Tenderness present. No swelling. Normal range of motion.     Cervical back: Normal range of motion and neck supple.     Comments: No midline spinal tenderness, tenderness to the left elbow, left shoulder  Skin:    General: Skin is warm and dry.     Capillary Refill: Capillary refill takes less than 2 seconds.  Neurological:     General: No focal deficit present.     Mental Status: She is alert and oriented to person, place, and time.     Cranial Nerves: No cranial nerve deficit.     Sensory: No sensory deficit.      Motor: No weakness.     Coordination: Coordination normal.     Comments: 5+ out of 5 strength throughout, normal sensation, no drift, normal finger-nose-finger, normal speech  Psychiatric:        Mood and Affect: Mood normal.     ED Results / Procedures / Treatments   Labs (all labs ordered are listed, but only abnormal results are displayed) Labs Reviewed - No data to display  EKG None  Radiology CT Cervical Spine Wo Contrast  Result Date: 05/24/2022 CLINICAL DATA:  Fall. EXAM: CT CERVICAL SPINE WITHOUT CONTRAST TECHNIQUE: Multidetector CT imaging of the cervical spine was performed without intravenous contrast. Multiplanar CT image reconstructions were also generated. RADIATION DOSE REDUCTION: This exam was performed according to the departmental dose-optimization program which includes automated exposure control, adjustment of the mA and/or kV according to patient size and/or use of iterative reconstruction technique. COMPARISON:  None Available. FINDINGS: Alignment: Normal. Skull base and vertebrae: No acute fracture. No primary bone lesion or focal pathologic process. Soft tissues and spinal canal: No prevertebral fluid or swelling. No visible canal hematoma. Disc levels: There is disc space narrowing and endplate osteophyte formation at C5-C6, C6-C7 and C7-T1. There is some mild right-sided neural foraminal stenosis at C3-C4 secondary to facet arthropathy and uncovertebral spurring. There is also mild left-sided neural foraminal stenosis at C5-C6 secondary to uncovertebral spurring. No significant central canal stenosis identified at any level. No significant central canal or neural foraminal stenosis at any level. Upper chest: Negative. Other: There is a heterogeneous hypodense left thyroid nodule measuring 9 mm. There are atherosclerotic calcifications of the carotid arteries and left vertebral artery. IMPRESSION: 1. No acute fracture or traumatic subluxation of the cervical spine.  2. Degenerative changes of the cervical spine as described above. 3. Subcentimeter incidental left thyroid nodule. No follow-up imaging is recommended. Reference: J Am Coll Radiol. 2015 Feb;12(2): 143-50 Electronically Signed   By: Ronney Asters M.D.   On: 05/24/2022 16:02   DG Shoulder Left  Result Date: 05/24/2022 CLINICAL DATA:  Fall.  Left shoulder injury. EXAM: LEFT SHOULDER - 2+ VIEW COMPARISON:  None Available. FINDINGS: No evidence for an acute fracture. No subluxation or dislocation. No evidence for shoulder separation. Bones are diffusely demineralized. IMPRESSION: No acute bony findings. Electronically Signed   By: Misty Stanley M.D.   On: 05/24/2022 15:58   DG Elbow Complete Left  Result Date: 05/24/2022 CLINICAL DATA:  Pain after fall. EXAM: LEFT ELBOW - COMPLETE 3+ VIEW COMPARISON:  None Available. FINDINGS: There is no evidence of fracture, dislocation, or joint effusion. There is no evidence of arthropathy or other focal bone abnormality. Soft tissues are unremarkable. IMPRESSION: Negative. Electronically Signed   By: Dorise Bullion III M.D.   On: 05/24/2022 15:58   CT Head Wo Contrast  Result Date: 05/24/2022 CLINICAL DATA:  Trauma, slurred speech, confusion EXAM: CT HEAD WITHOUT CONTRAST TECHNIQUE: Contiguous axial images were obtained from the base of the skull through the vertex without intravenous contrast. RADIATION DOSE REDUCTION: This exam was performed according to the departmental dose-optimization program which includes automated exposure control, adjustment of the mA and/or kV according to patient size and/or use of iterative reconstruction technique. COMPARISON:  10/01/2021 FINDINGS: Brain: No acute intracranial findings are seen. There are no signs of bleeding within the cranium. There is no focal edema or mass effect. Cortical sulci are prominent. There is decreased density in periventricular and subcortical white matter. Vascular: Scattered arterial calcifications are  seen. Skull: No fracture is seen. Sinuses/Orbits: Unremarkable. Other: None. IMPRESSION: No acute intracranial findings are seen. Atrophy. Small-vessel disease. Electronically Signed   By: Elmer Picker M.D.   On: 05/24/2022 15:43    Procedures Procedures    Medications Ordered in ED Medications - No data to display  ED Course/ Medical Decision Making/ A&P                           Medical Decision Making Amount  and/or Complexity of Data Reviewed Radiology: ordered.   Tyrone EDDYE BROXTERMAN is here after mechanical fall.  Comorbidities that are important to her A-fib, patient on Xarelto.  She hit her head on her fall.  Mechanical fall.  Ambulatory and back to her baseline since the fall.  She was little bit confused after she hit her head but that was short-lived.  No seizure activity.  Neurologically she is intact.  Differential diagnosis is suspect contusion.  We will get CT scan of the head to rule out head bleed.  We will get x-rays of the shoulder and left elbow to rule out fracture.  CT scan of the head and neck per radiology report shows no acute findings.  Per my review and interpretation of x-rays there is no acute findings.  Radiology report agrees.  Overall patient neurologically intact.  Neurovascularly intact.  No other trauma or tenderness on exam.  Discharged in good condition.  This chart was dictated using voice recognition software.  Despite best efforts to proofread,  errors can occur which can change the documentation meaning.         Final Clinical Impression(s) / ED Diagnoses Final diagnoses:  Fall, initial encounter    Rx / DC Orders ED Discharge Orders     None         Lennice Sites, DO 05/24/22 1620

## 2022-05-24 NOTE — ED Triage Notes (Signed)
Pt fell into door jamb at home today; hit LT side head and LT shoulder; pt is on blood thinners; family sts she was confused and slurring her speech right after fall; pt remembers fall, no LOC, had just been napping

## 2022-06-20 ENCOUNTER — Encounter (HOSPITAL_BASED_OUTPATIENT_CLINIC_OR_DEPARTMENT_OTHER): Payer: Self-pay | Admitting: Emergency Medicine

## 2022-06-20 ENCOUNTER — Other Ambulatory Visit: Payer: Self-pay

## 2022-06-20 ENCOUNTER — Emergency Department (HOSPITAL_BASED_OUTPATIENT_CLINIC_OR_DEPARTMENT_OTHER): Payer: Medicare Other

## 2022-06-20 DIAGNOSIS — I509 Heart failure, unspecified: Secondary | ICD-10-CM | POA: Insufficient documentation

## 2022-06-20 DIAGNOSIS — Z79899 Other long term (current) drug therapy: Secondary | ICD-10-CM | POA: Diagnosis not present

## 2022-06-20 DIAGNOSIS — U071 COVID-19: Secondary | ICD-10-CM | POA: Diagnosis not present

## 2022-06-20 DIAGNOSIS — Z794 Long term (current) use of insulin: Secondary | ICD-10-CM | POA: Diagnosis not present

## 2022-06-20 DIAGNOSIS — E119 Type 2 diabetes mellitus without complications: Secondary | ICD-10-CM | POA: Insufficient documentation

## 2022-06-20 DIAGNOSIS — I110 Hypertensive heart disease with heart failure: Secondary | ICD-10-CM | POA: Diagnosis not present

## 2022-06-20 DIAGNOSIS — Z7901 Long term (current) use of anticoagulants: Secondary | ICD-10-CM | POA: Diagnosis not present

## 2022-06-20 DIAGNOSIS — R0602 Shortness of breath: Secondary | ICD-10-CM | POA: Diagnosis present

## 2022-06-20 NOTE — ED Triage Notes (Signed)
Pt has not felt well for a couple days; c/o SHOB and cough tonight

## 2022-06-21 ENCOUNTER — Emergency Department (HOSPITAL_BASED_OUTPATIENT_CLINIC_OR_DEPARTMENT_OTHER)
Admission: EM | Admit: 2022-06-21 | Discharge: 2022-06-21 | Disposition: A | Discharge status: Home / Self Care | Payer: Medicare Other | Attending: Emergency Medicine | Admitting: Emergency Medicine

## 2022-06-21 DIAGNOSIS — U071 COVID-19: Secondary | ICD-10-CM

## 2022-06-21 LAB — TROPONIN I (HIGH SENSITIVITY): Troponin I (High Sensitivity): 14 ng/L (ref <18)

## 2022-06-21 LAB — RESP PANEL BY RT-PCR (FLU A&B, COVID) ARPGX2
Influenza A by PCR: NEGATIVE
Influenza B by PCR: NEGATIVE
SARS Coronavirus 2 by RT PCR: POSITIVE — AB

## 2022-06-21 LAB — COMPREHENSIVE METABOLIC PANEL
ALT: 51 U/L — ABNORMAL HIGH (ref 0–44)
AST: 25 U/L (ref 15–41)
Albumin: 3.6 g/dL (ref 3.5–5.0)
Alkaline Phosphatase: 116 U/L (ref 38–126)
Anion gap: 8 (ref 5–15)
BUN: 49 mg/dL — ABNORMAL HIGH (ref 8–23)
CO2: 23 mmol/L (ref 22–32)
Calcium: 9.4 mg/dL (ref 8.9–10.3)
Chloride: 104 mmol/L (ref 98–111)
Creatinine, Ser: 1.94 mg/dL — ABNORMAL HIGH (ref 0.44–1.00)
GFR, Estimated: 25 mL/min — ABNORMAL LOW (ref >60)
Glucose, Bld: 232 mg/dL — ABNORMAL HIGH (ref 70–99)
Potassium: 4.7 mmol/L (ref 3.5–5.1)
Sodium: 135 mmol/L (ref 135–145)
Total Bilirubin: 0.5 mg/dL (ref 0.3–1.2)
Total Protein: 7.2 g/dL (ref 6.5–8.1)

## 2022-06-21 LAB — CBC WITH DIFFERENTIAL/PLATELET
Abs Immature Granulocytes: 0.03 10*3/uL (ref 0.00–0.07)
Basophils Absolute: 0 10*3/uL (ref 0.0–0.1)
Basophils Relative: 0 %
Eosinophils Absolute: 0 10*3/uL (ref 0.0–0.5)
Eosinophils Relative: 0 %
HCT: 34.5 % — ABNORMAL LOW (ref 36.0–46.0)
Hemoglobin: 11 g/dL — ABNORMAL LOW (ref 12.0–15.0)
Immature Granulocytes: 0 %
Lymphocytes Relative: 6 %
Lymphs Abs: 0.6 10*3/uL — ABNORMAL LOW (ref 0.7–4.0)
MCH: 28.4 pg (ref 26.0–34.0)
MCHC: 31.9 g/dL (ref 30.0–36.0)
MCV: 88.9 fL (ref 80.0–100.0)
Monocytes Absolute: 0.5 10*3/uL (ref 0.1–1.0)
Monocytes Relative: 5 %
Neutro Abs: 8.8 10*3/uL — ABNORMAL HIGH (ref 1.7–7.7)
Neutrophils Relative %: 89 %
Platelets: 230 10*3/uL (ref 150–400)
RBC: 3.88 MIL/uL (ref 3.87–5.11)
RDW: 14.2 % (ref 11.5–15.5)
WBC: 9.9 10*3/uL (ref 4.0–10.5)
nRBC: 0 % (ref 0.0–0.2)

## 2022-06-21 LAB — BRAIN NATRIURETIC PEPTIDE: B Natriuretic Peptide: 182.2 pg/mL — ABNORMAL HIGH (ref 0.0–100.0)

## 2022-06-21 MED ORDER — MOLNUPIRAVIR EUA 200MG CAPSULE: 4.0000 | ORAL_CAPSULE | Freq: Two times a day (BID) | ORAL | 0 refills | Status: AC

## 2022-06-21 NOTE — Discharge Instructions (Addendum)
Begin taking molnupiravir as prescribed.  Take over the counter medications as prescribed as needed for symptom relief.  Return to the emergency department if you develop severe chest pain, difficulty breathing, or for other new and concerning symptoms.

## 2022-06-21 NOTE — ED Provider Notes (Signed)
[facility] Provider Note   CSN: [id number] Arrival date & time: 06/20/22  2132     History  Chief Complaint  Patient presents with   Shortness of Breath    Daisy Pearson is a 88 y.o. female.  Patient is an 88 year old female with past medical history of chronic renal insufficiency, paroxysmal atrial fibrillation, hypertension, type 2 diabetes, chronic CHF.  Patient presenting today for evaluation of shortness of breath.  This has been worsening for the past 2 days.  Daisy Pearson describes shortness of breath on exertion and cough that is productive of white sputum.  Daisy Pearson denies any fevers or chills.  Daisy Pearson denies any leg swelling.  The history is provided by the patient.  Shortness of Breath Severity:  Moderate Onset quality:  Gradual Duration:  2 days Timing:  Constant Progression:  Worsening Chronicity:  New      Home Medications Prior to Admission medications   Medication Sig Start Date End Date Taking? Authorizing Provider  alendronate (FOSAMAX) 70 MG tablet Take 70 mg by mouth every Saturday. Take with a full glass of water on an empty stomach.    [provider]  amiodarone (PACERONE) 200 MG tablet Take 200 mg by mouth daily.    [provider]  amLODipine (NORVASC) 5 MG tablet Take 1 tablet (5 mg total) by mouth daily. 02/24/17   Ina Homes, MD  atorvastatin (LIPITOR) 40 MG tablet Take 1 tablet (40 mg total) by mouth daily. HOLD until follow-up with your PCP 10/03/21   Rai, Vernelle Emerald, MD  Buprenorphine HCl (BELBUCA) 300 MCG FILM Place 1 Film inside cheek 2 (two) times daily.  03/20/19   [provider]  calcitRIOL (ROCALTROL) 0.25 MCG capsule Take 0.25 mcg by mouth daily.    [provider]  cetirizine (ZYRTEC) 10 MG tablet Take 10 mg by mouth daily as needed for allergies.    [provider]  Cholecalciferol (VITAMIN D) 2000 units tablet Take 4,000 Units by mouth daily.     [provider]  diclofenac Sodium (VOLTAREN) 1 % GEL Apply 2 g topically 2 (two) times daily as needed (joint pain).    [provider]  enalapril (VASOTEC) 2.5 MG tablet Take 1 tablet (2.5 mg total) by mouth daily. HOLD until follow-up with your PCP 10/03/21   Rai, Vernelle Emerald, MD  EPINEPHrine 0.3 mg/0.3 mL IJ SOAJ injection Inject 0.3 mg into the muscle once as needed (severe allergic reaction).    [provider]  furosemide (LASIX) 20 MG tablet Take 1 tablet (20 mg total) by mouth daily for 2 days. HOLD until follow up with your PCP 10/03/21 10/05/21  Rai, Vernelle Emerald, MD  insulin aspart (NOVOLOG) 100 UNIT/ML FlexPen Inject 4-16 Units into the skin 3 (three) times daily before meals. Per sliding scale 04/30/21   [provider]  Insulin Glargine (BASAGLAR KWIKPEN) 100 UNIT/ML Inject 22 Units into the skin 2 (two) times daily. 08/07/21   [provider]  ipratropium (ATROVENT) 0.06 % nasal spray Place 2 sprays into both nostrils 4 (four) times daily as needed for rhinitis. 08/02/19   [provider]  metaxalone (SKELAXIN) 800 MG tablet Take 800 mg by mouth 3 (three) times daily as needed for muscle spasms.    [provider]  Methylfol-Methylcob-Acetylcyst (CEREFOLIN NAC) 6-2-600 MG TABS Take 1 tablet by mouth daily.    [provider]  metoprolol tartrate (LOPRESSOR) 25 MG tablet Take 25 mg by mouth 2 (  two) times daily.    [provider]  mirtazapine (REMERON) 15 MG tablet Take 15 mg by mouth at bedtime.    [provider]  nitroGLYCERIN (NITROSTAT) 0.4 MG SL tablet Place 0.4 mg under the tongue every 5 (five) minutes as needed for chest pain.    [provider]  Omega-3 Fatty Acids (OMEGA 3 PO) Take 1 capsule by mouth daily.    [provider]  ondansetron (ZOFRAN-ODT) 4 MG disintegrating tablet Take 1 tablet (4 mg total) by mouth every 8 (eight) hours as needed for nausea or vomiting. 09/27/21   Gareth Morgan, MD   QUEtiapine (SEROQUEL) 25 MG tablet Take 25 mg by mouth at bedtime.    [provider]  Rivaroxaban (XARELTO) 15 MG TABS tablet Take 15 mg by mouth daily with supper. 09/03/21 09/03/22  [provider]      Allergies    Baclofen, Esomeprazole sodium, Penicillins, Bactrim [sulfamethoxazole-trimethoprim], Clindamycin/lincomycin, Hydrocodone, Nsaids, Valsartan, Hydrocodone-acetaminophen, and Prilosec [omeprazole]    Review of Systems   Review of Systems  Respiratory:  Positive for shortness of breath.   All other systems reviewed and are negative.   Physical Exam Updated Vital Signs BP (!) 158/131   Pulse 73   Temp 98.1 F (36.7 C)   Resp 18   Ht 5\' 3"  (1.6 m)   Wt 91.2 kg   SpO2 99%   BMI 35.62 kg/m  Physical Exam Vitals and nursing note reviewed.  Constitutional:      General: Daisy Pearson is not in acute distress.    Appearance: Daisy Pearson is well-developed. Daisy Pearson is not diaphoretic.  HENT:     Head: Normocephalic and atraumatic.  Cardiovascular:     Rate and Rhythm: Normal rate and regular rhythm.     Heart sounds: No murmur heard.    No friction rub. No gallop.  Pulmonary:     Effort: Pulmonary effort is normal. No respiratory distress.     Breath sounds: Normal breath sounds. No wheezing.  Abdominal:     General: Bowel sounds are normal. There is no distension.     Palpations: Abdomen is soft.     Tenderness: There is no abdominal tenderness.  Musculoskeletal:        General: Normal range of motion.     Cervical back: Normal range of motion and neck supple.  Skin:    General: Skin is warm and dry.  Neurological:     General: No focal deficit present.     Mental Status: Daisy Pearson is alert and oriented to person, place, and time.     ED Results / Procedures / Treatments   Labs (all labs ordered are listed, but only abnormal results are displayed) Labs Reviewed  RESP PANEL BY RT-PCR (FLU A&B, COVID) [id number]  COMPREHENSIVE METABOLIC PANEL  CBC WITH  DIFFERENTIAL/PLATELET  BRAIN NATRIURETIC PEPTIDE  TROPONIN I (HIGH SENSITIVITY)    EKG None  Radiology DG Chest 2 View  Result Date: 06/20/2022 CLINICAL DATA:  Shortness of breath. EXAM: CHEST - 2 VIEW COMPARISON:  04/12/2022. FINDINGS: Heart is enlarged and the mediastinal contour is stable. Atherosclerotic calcification of the aorta is noted. No consolidation, effusion, or pneumothorax. A stable dual lead pacemaker device is present over the right chest. Sternotomy wires are noted over the midline. IMPRESSION: Cardiomegaly with no active cardiopulmonary disease. Electronically Signed   By: Brett Fairy M.D.   On: 06/20/2022 22:23    Procedures Procedures    Medications Ordered in ED Medications - No  data to display  ED Course/ Medical Decision Making/ A&P  Patient is an 88 year old female presenting with complaints of shortness of breath and cough for the past 2 days.  Daisy Pearson has not had any fever or contacts with ill individuals.  Daisy Pearson arrives here with stable vital signs, no fever, and no hypoxia.  Workup initiated including laboratory studies.  CBC shows no leukocytosis or other abnormality.  Basic metabolic panel shows chronic renal insufficiency consistent with baseline, but is otherwise unremarkable.  Troponin and BNP essentially unremarkable.  COVID and flu swabs obtained and returned positive for COVID-19.  Chest x-ray shows cardiomegaly with no active cardiopulmonary disease.  Patient's symptoms related to COVID-19.  Daisy Pearson will be treated with molnupiravir and discharge.  There is no hypoxia and vital signs are stable.  Patient and daughter at bedside are comfortable with disposition.  Final Clinical Impression(s) / ED Diagnoses Final diagnoses:  None    Rx / DC Orders ED Discharge Orders     None         Veryl Speak, MD 06/21/22 [phone]

## 2023-01-14 LAB — LAB REPORT - SCANNED: EGFR: 32

## 2023-03-28 DEATH — deceased

## 2023-05-02 IMAGING — CT CT ORBITS W/O CM
3 of 4 series · 14 of 47 positions shown, 16 images · non-contrast
Comparison: Head CT today reported separately. Brain MRI
06/13/2015.

CLINICAL DATA: 87-year-old female who woke with headache, redness
of left sclera. On blood thinners.

EXAM:
CT ORBITS WITHOUT CONTRAST
TECHNIQUE: Multidetector CT imaging of the orbits was performed using the
standard protocol without intravenous contrast. Multiplanar CT image
reconstructions were also generated.
RADIATION DOSE REDUCTION: This exam was performed according to the
departmental dose-optimization program which includes automated
exposure control, adjustment of the mA and/or kV according to
patient size and/or use of iterative reconstruction technique.

[Series 3: orbits 2.0 h30s st · axial · 0.27mm/px · z∈[+886,+960]mm · 8 of 45 slices shown, 10 images]
[im 4/45  brain]
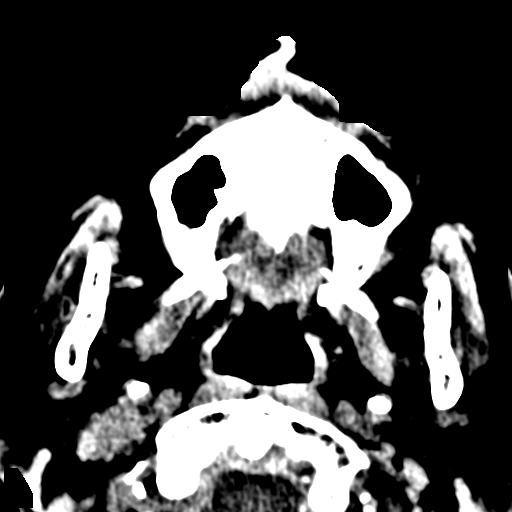
[im 4/45  bone]
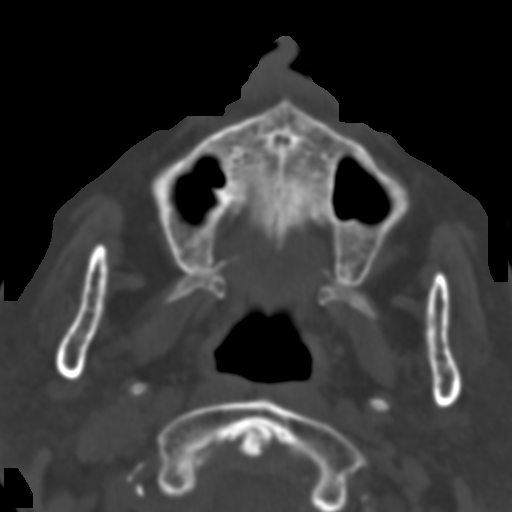
[im 10/45  bone]
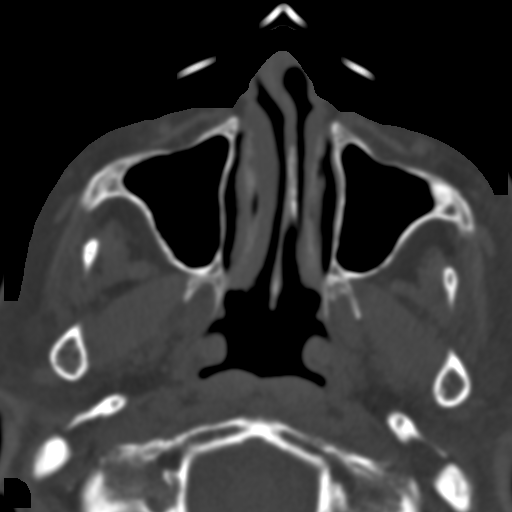
[im 14/45  bone]
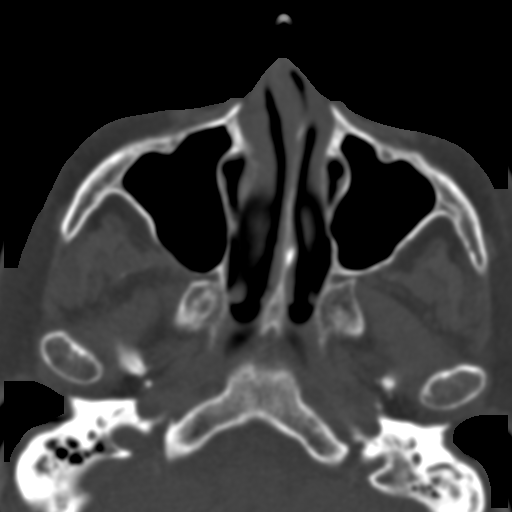
[im 20/45  bone]
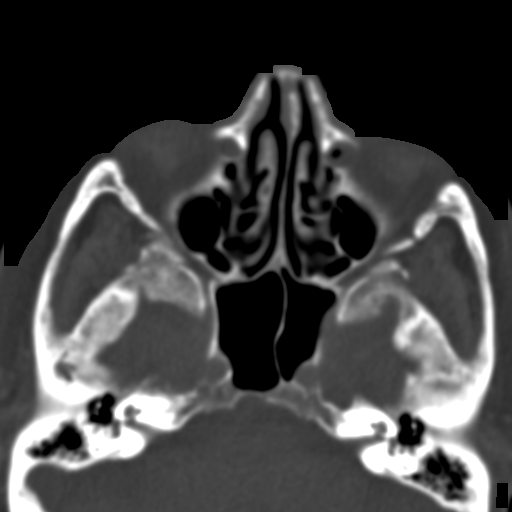
[im 25/45  brain]
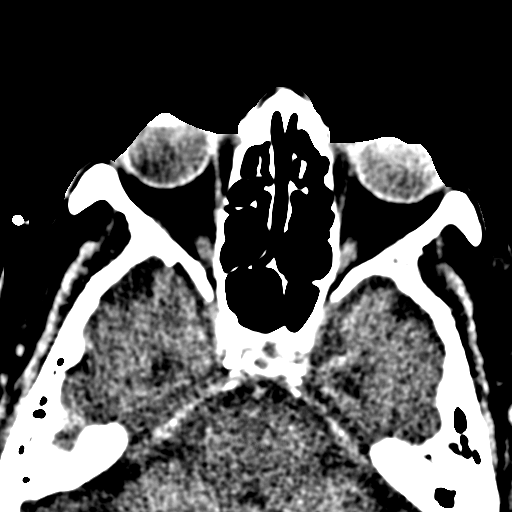
[im 25/45  bone]
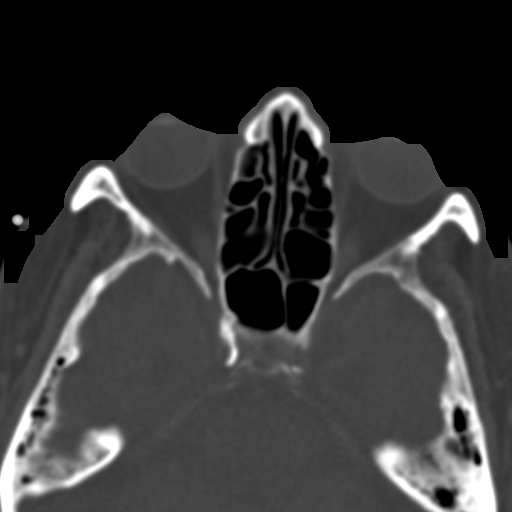
[im 31/45  bone]
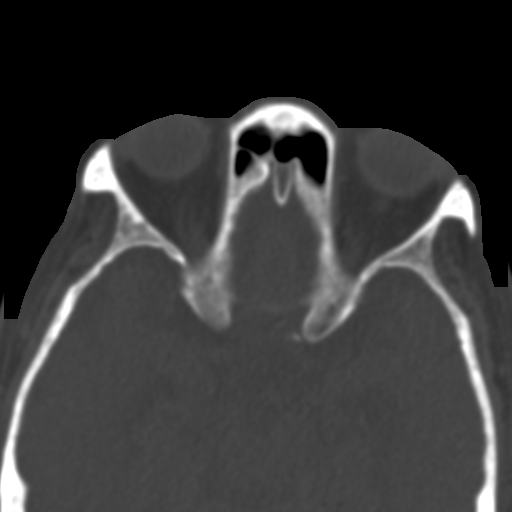
[im 35/45  bone]
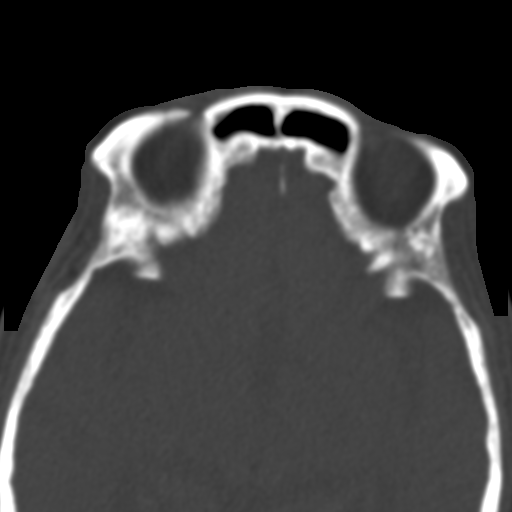
[im 41/45  bone]
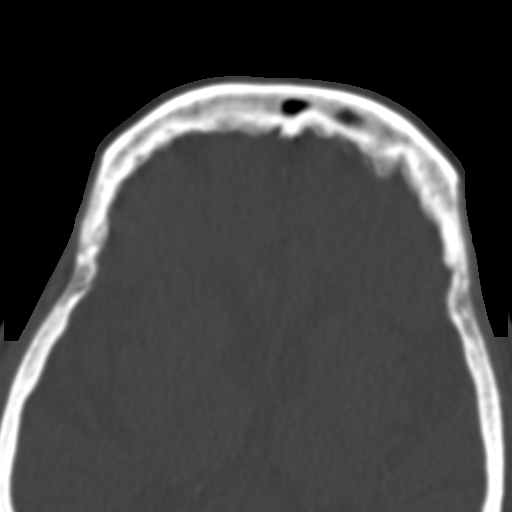

[Series 6: orbits 2.0 coronal bone · coronal · 0.20mm/px · 3 of 86 slices shown]
[im 22/86  bone]
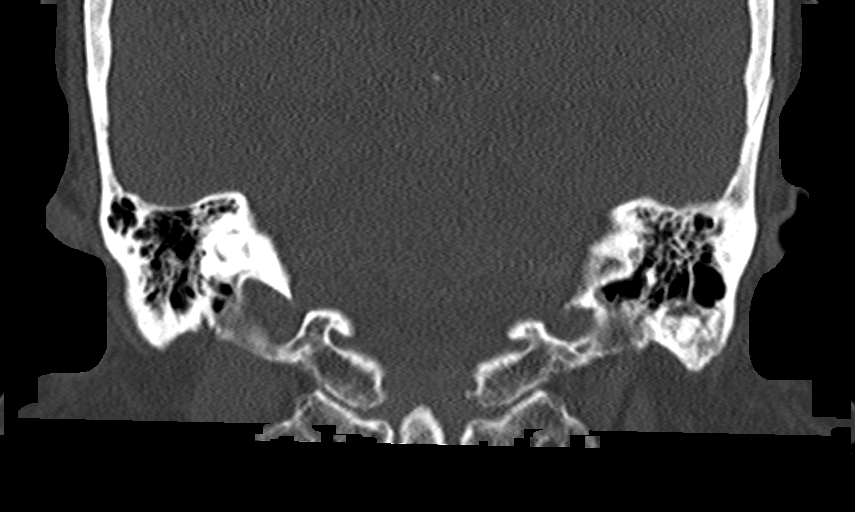
[im 43/86  bone]
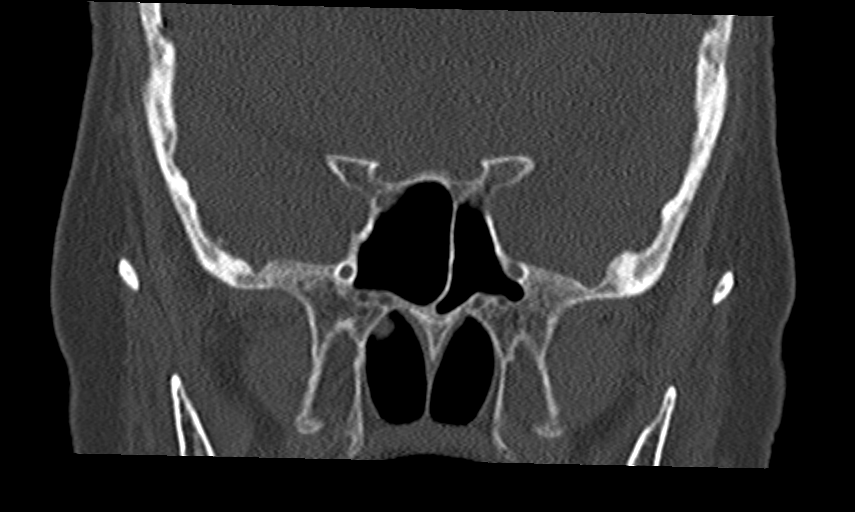
[im 64/86  bone]
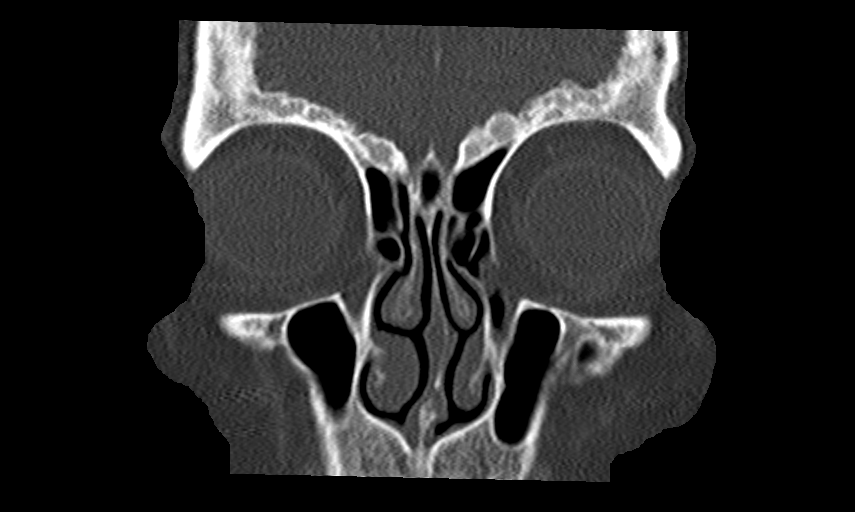

[Series 9: orbits 2.0 sagittal st · sagittal · 0.21mm/px · 3 of 65 slices shown]
[im 22/65  bone]
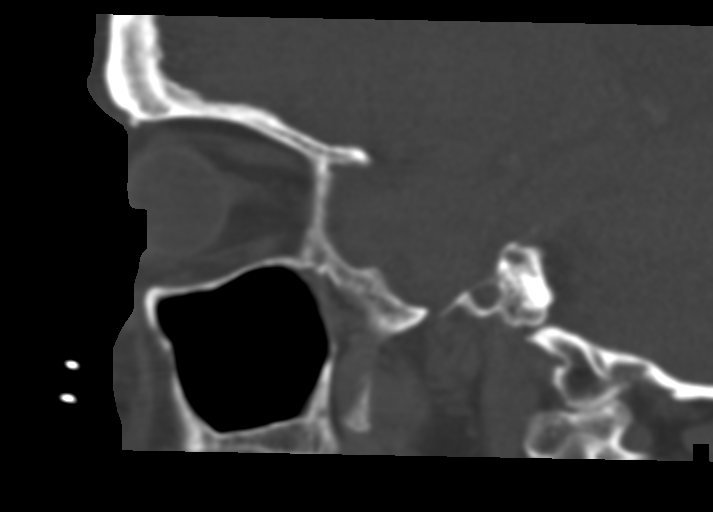
[im 33/65  bone]
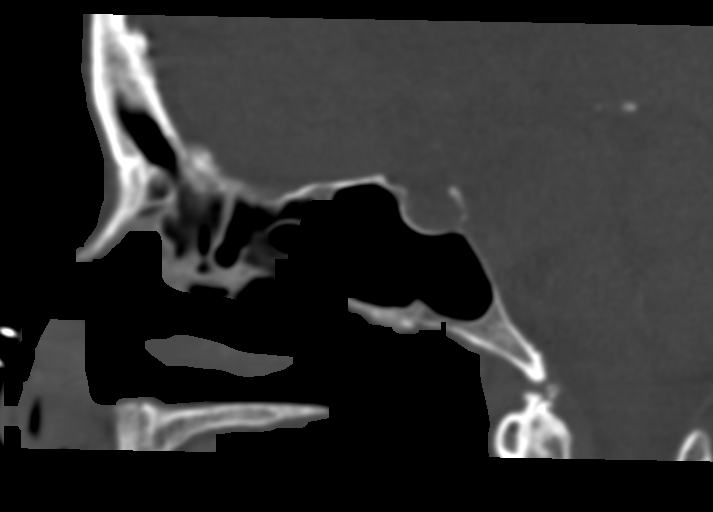
[im 43/65  bone]
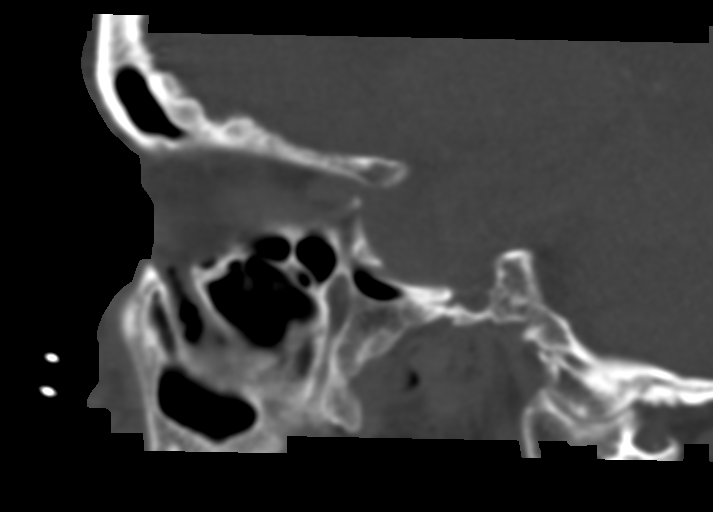

[14 of 47 positions shown; findings below may reference images not displayed]

FINDINGS: Orbits: Chronic postoperative changes to both globes. Symmetric and
normal noncontrast appearance of the intraorbital soft tissues. No
superior ophthalmic vein enlargement. No intraorbital mass or
inflammation identified.

Visible paranasal sinuses: Clear throughout.

Soft tissues: Negative visible noncontrast deep soft tissue spaces
of the face. Visualized scalp soft tissues are within normal limits.

Osseous: No acute osseous abnormality identified.

Limited intracranial: Calcified atherosclerosis at the skull base.
Negative noncontrast appearance of the cavernous sinus. Brain
detailed separately today.
IMPRESSION: Negative noncontrast CT appearance of the Orbits.

## 2023-05-02 IMAGING — CT CT HEAD W/O CM
3 series · 16 of 47 positions shown, 19 images · non-contrast
Comparison: Brain MRI 06/13/2015. Head CT 08/17/2019.

CLINICAL DATA: 87-year-old female who woke with headache, redness
of left sclera. On blood thinners.



[Series 2: head wo · axial · 0.41mm/px · z∈[+898,+1033]mm · 10 of 33 slices shown, 13 images]
[im 3/33  brain]
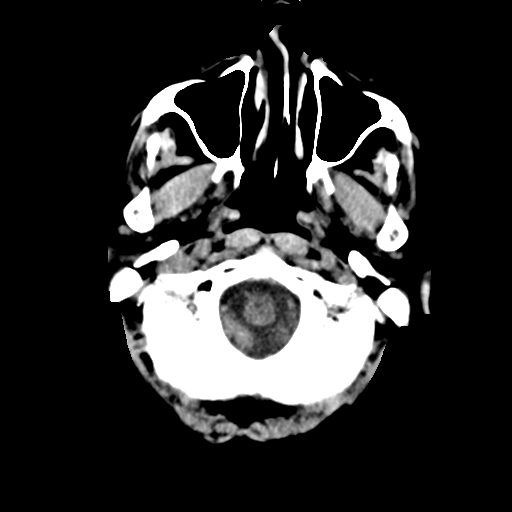
[im 3/33  bone]
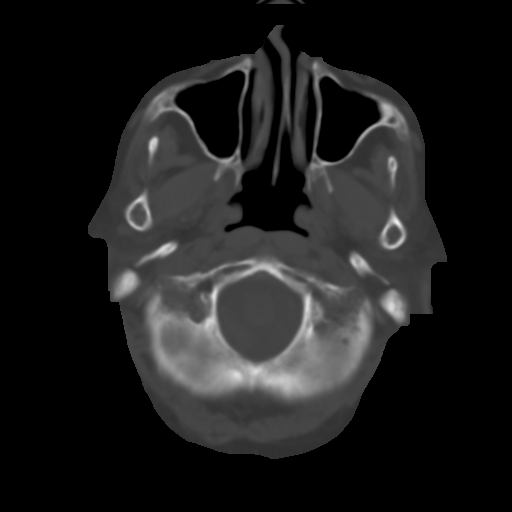
[im 6/33  brain]
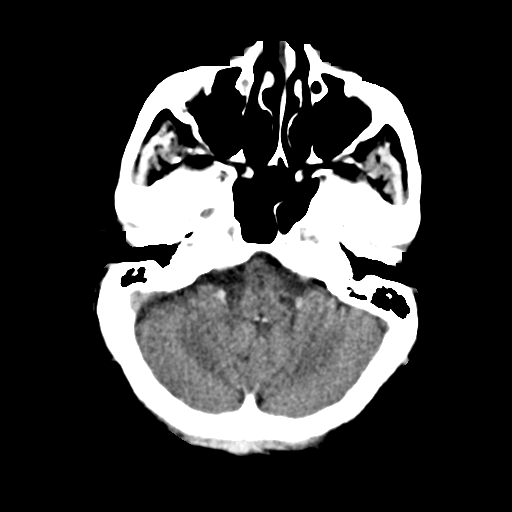
[im 9/33  brain]
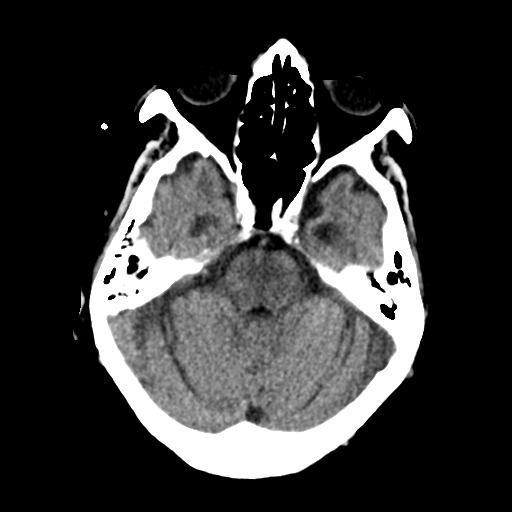
[im 12/33  brain]
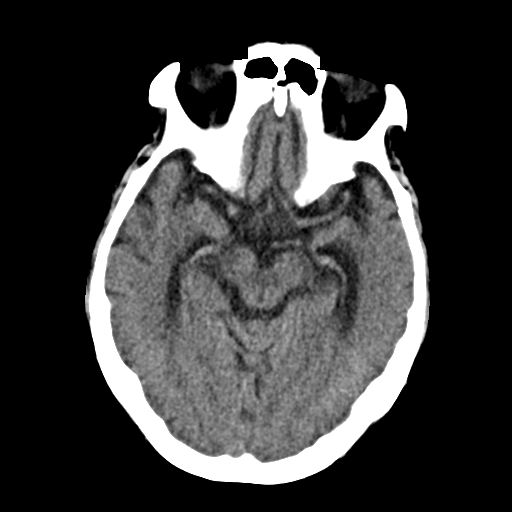
[im 15/33  brain]
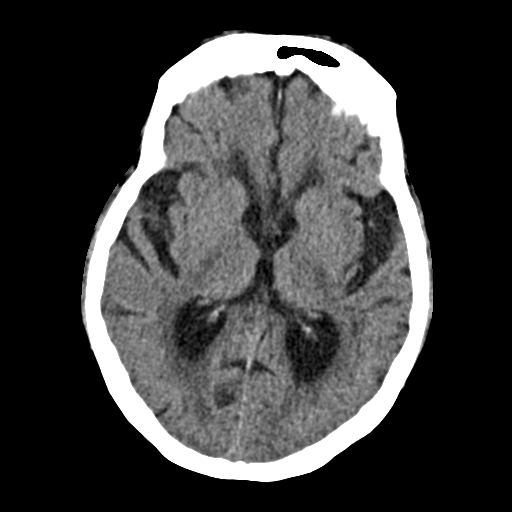
[im 15/33  bone]
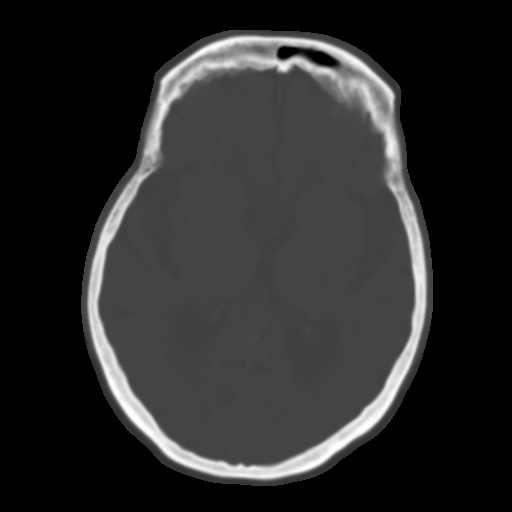
[im 18/33  brain]
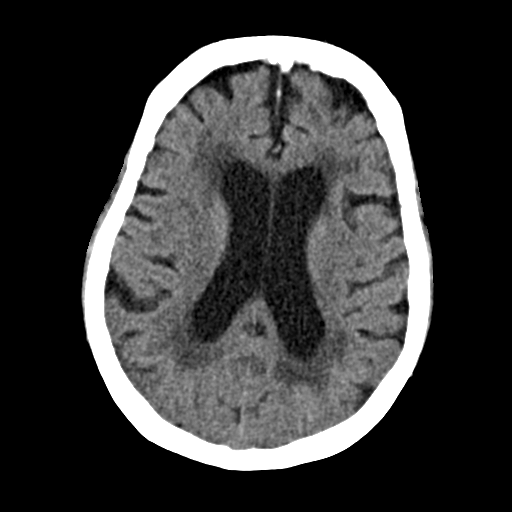
[im 21/33  brain]
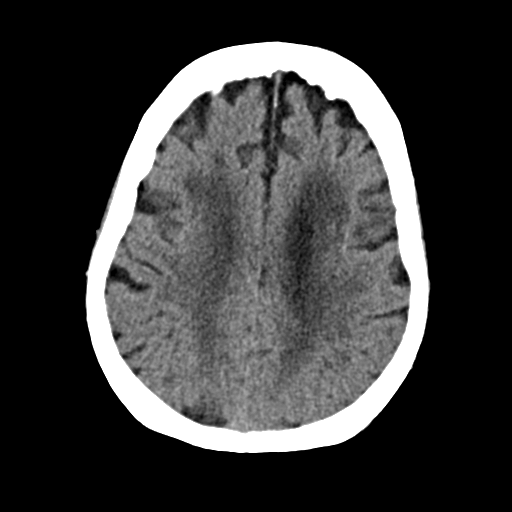
[im 25/33  brain]
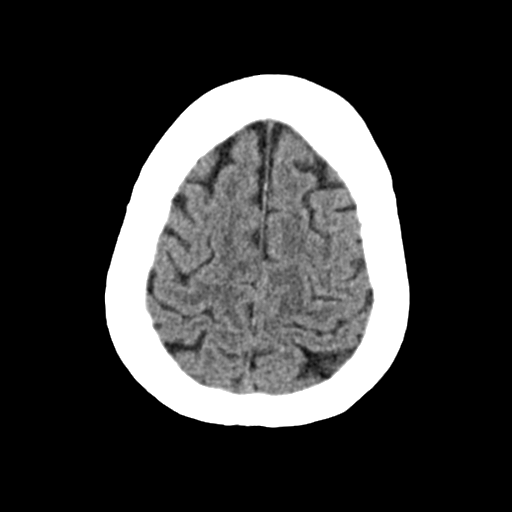
[im 27/33  brain]
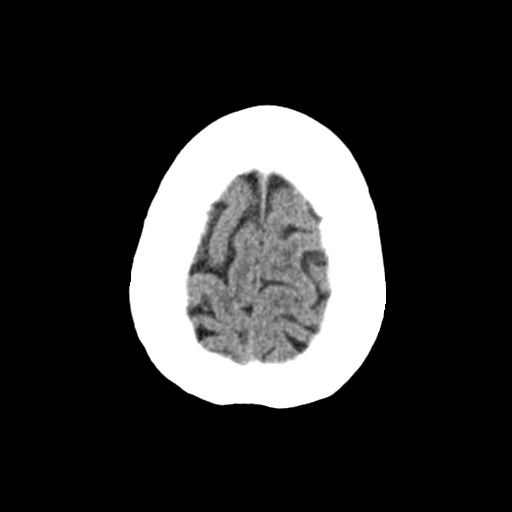
[im 27/33  bone]
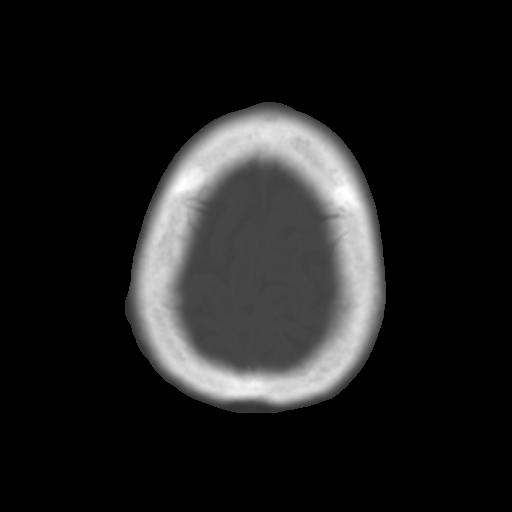
[im 30/33  brain]
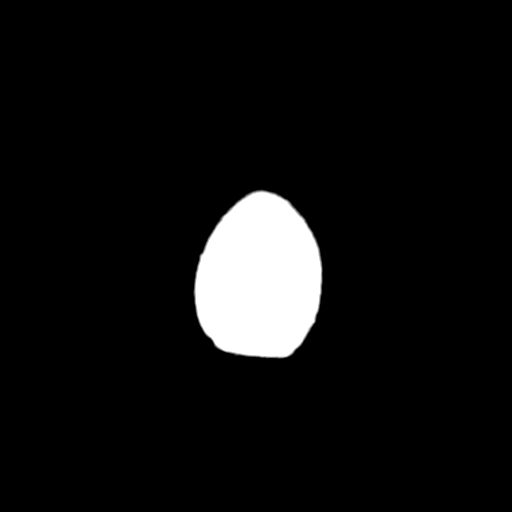

[Series 4: coronal soft · coronal · 0.32mm/px · 3 of 67 slices shown]
[im 23/67  brain]
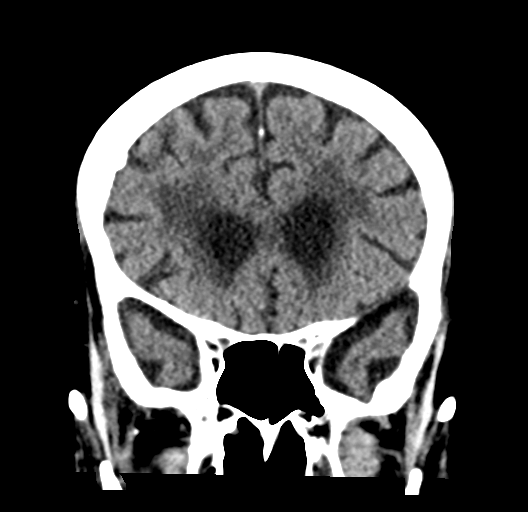
[im 30/67  brain]
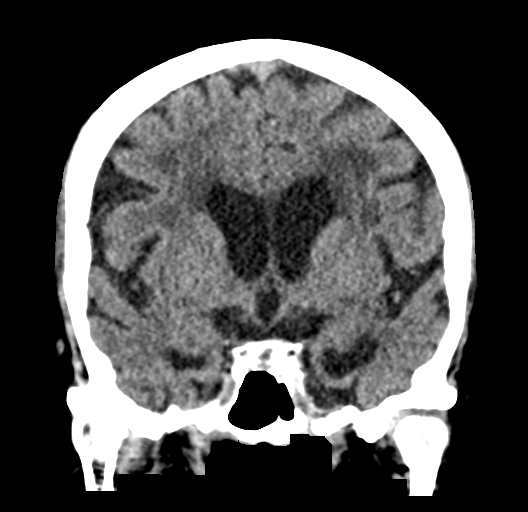
[im 37/67  brain]
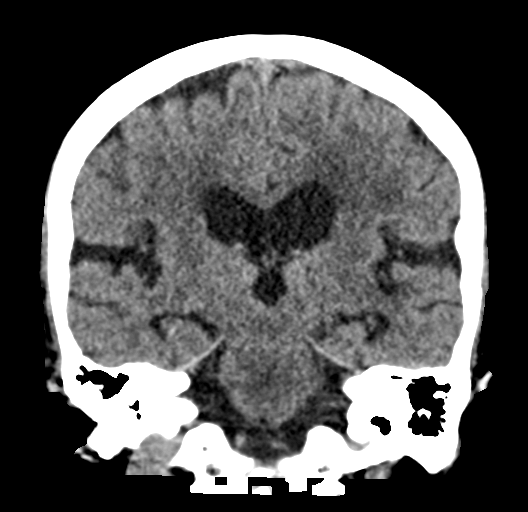

[Series 5: sag soft · sagittal · 0.33mm/px · 3 of 57 slices shown]
[im 19/57  brain]
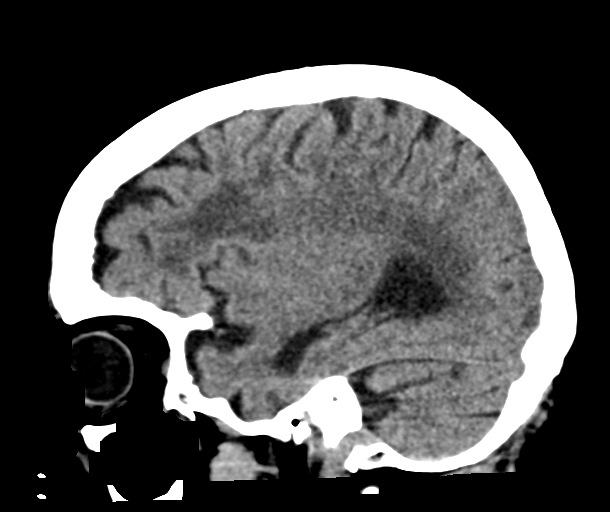
[im 29/57  brain]
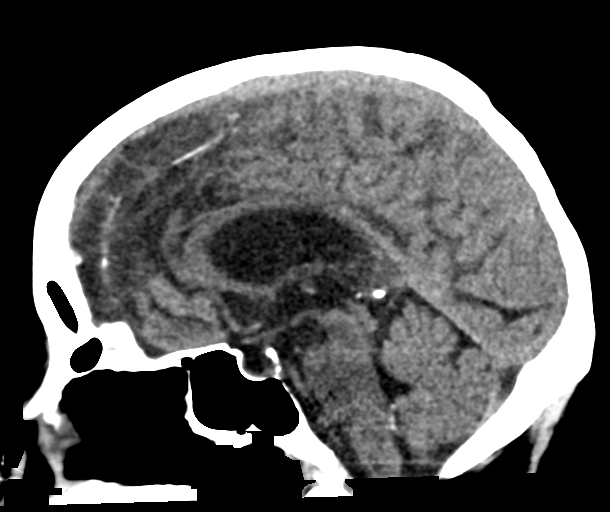
[im 38/57  brain]
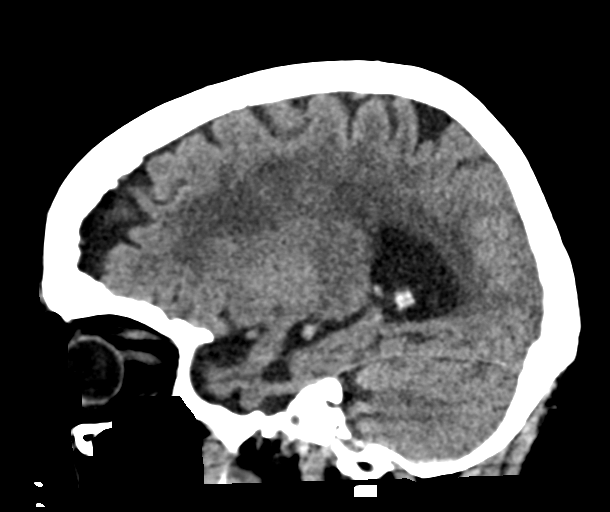

[16 of 47 positions shown; findings below may reference images not displayed]

FINDINGS: Brain: Stable cerebral volume from last year. Confluent bilateral
cerebral white matter hypodensity appears stable, relatively sparing
the deep gray nuclei. No midline shift, ventriculomegaly, mass
effect, evidence of mass lesion, intracranial hemorrhage or evidence
of cortically based acute infarction.

Vascular: Calcified atherosclerosis at the skull base. No suspicious
intracranial vascular hyperdensity.

Skull: No acute osseous abnormality identified.

Sinuses/Orbits: Visualized paranasal sinuses and mastoids are stable
and well aerated.

Other: Postoperative changes to both globes. Dedicated Orbit CT is
reported separately today.

Visualized scalp soft tissues are within normal limits.
IMPRESSION: 1. No acute intracranial abnormality. Stable non contrast CT
appearance of the brain since last year.
2. See also Orbit CT today reported separately.
